# Patient Record
Sex: Female | Born: 1955 | Race: White | Hispanic: No | Marital: Married | Smoking: Never smoker
Health system: Southern US, Community
[De-identification: ages and names within clinical notes are randomized; demographics above are authoritative.]

## PROBLEM LIST (undated history)

## (undated) DIAGNOSIS — H269 Unspecified cataract: Secondary | ICD-10-CM

## (undated) DIAGNOSIS — J302 Other seasonal allergic rhinitis: Secondary | ICD-10-CM

## (undated) DIAGNOSIS — T7840XA Allergy, unspecified, initial encounter: Secondary | ICD-10-CM

## (undated) DIAGNOSIS — S62307A Unspecified fracture of fifth metacarpal bone, left hand, initial encounter for closed fracture: Secondary | ICD-10-CM

## (undated) DIAGNOSIS — M1990 Unspecified osteoarthritis, unspecified site: Secondary | ICD-10-CM

## (undated) DIAGNOSIS — F419 Anxiety disorder, unspecified: Secondary | ICD-10-CM

## (undated) DIAGNOSIS — F32A Depression, unspecified: Secondary | ICD-10-CM

## (undated) DIAGNOSIS — E079 Disorder of thyroid, unspecified: Secondary | ICD-10-CM

## (undated) DIAGNOSIS — E785 Hyperlipidemia, unspecified: Secondary | ICD-10-CM

## (undated) DIAGNOSIS — K5792 Diverticulitis of intestine, part unspecified, without perforation or abscess without bleeding: Secondary | ICD-10-CM

## (undated) DIAGNOSIS — K219 Gastro-esophageal reflux disease without esophagitis: Secondary | ICD-10-CM

## (undated) HISTORY — DX: Anxiety disorder, unspecified: F41.9

## (undated) HISTORY — DX: Gastro-esophageal reflux disease without esophagitis: K21.9

## (undated) HISTORY — DX: Unspecified osteoarthritis, unspecified site: M19.90

## (undated) HISTORY — DX: Other seasonal allergic rhinitis: J30.2

## (undated) HISTORY — DX: Unspecified cataract: H26.9

## (undated) HISTORY — PX: COLONOSCOPY: SHX174

## (undated) HISTORY — PX: APPENDECTOMY: SHX54

## (undated) HISTORY — DX: Diverticulitis of intestine, part unspecified, without perforation or abscess without bleeding: K57.92

## (undated) HISTORY — PX: FRACTURE SURGERY: SHX138

## (undated) HISTORY — PX: TUBAL LIGATION: SHX77

## (undated) HISTORY — DX: Depression, unspecified: F32.A

## (undated) HISTORY — DX: Allergy, unspecified, initial encounter: T78.40XA

## (undated) HISTORY — DX: Hyperlipidemia, unspecified: E78.5

## (undated) HISTORY — DX: Disorder of thyroid, unspecified: E07.9

---

## 1998-11-13 ENCOUNTER — Other Ambulatory Visit: Admission: RE | Admit: 1998-11-13 | Discharge: 1998-11-13 | Payer: Self-pay | Admitting: Gynecology

## 1999-11-17 ENCOUNTER — Other Ambulatory Visit: Admission: RE | Admit: 1999-11-17 | Discharge: 1999-11-17 | Payer: Self-pay | Admitting: Gynecology

## 2001-01-02 ENCOUNTER — Other Ambulatory Visit: Admission: RE | Admit: 2001-01-02 | Discharge: 2001-01-02 | Payer: Self-pay | Admitting: Gynecology

## 2002-01-04 ENCOUNTER — Other Ambulatory Visit: Admission: RE | Admit: 2002-01-04 | Discharge: 2002-01-04 | Payer: Self-pay | Admitting: Gynecology

## 2003-02-12 ENCOUNTER — Other Ambulatory Visit: Admission: RE | Admit: 2003-02-12 | Discharge: 2003-02-12 | Payer: Self-pay | Admitting: Gynecology

## 2004-02-21 ENCOUNTER — Other Ambulatory Visit: Admission: RE | Admit: 2004-02-21 | Discharge: 2004-02-21 | Payer: Self-pay | Admitting: Obstetrics and Gynecology

## 2005-02-16 ENCOUNTER — Ambulatory Visit: Payer: Self-pay | Admitting: Internal Medicine

## 2005-07-30 ENCOUNTER — Other Ambulatory Visit: Admission: RE | Admit: 2005-07-30 | Discharge: 2005-07-30 | Payer: Self-pay | Admitting: Obstetrics and Gynecology

## 2006-04-14 ENCOUNTER — Ambulatory Visit: Payer: Self-pay | Admitting: Internal Medicine

## 2006-08-09 ENCOUNTER — Ambulatory Visit: Payer: Self-pay | Admitting: Internal Medicine

## 2006-10-03 ENCOUNTER — Emergency Department (HOSPITAL_COMMUNITY): Admission: EM | Admit: 2006-10-03 | Discharge: 2006-10-03 | Payer: Self-pay | Admitting: Emergency Medicine

## 2006-10-06 ENCOUNTER — Emergency Department (HOSPITAL_COMMUNITY): Admission: EM | Admit: 2006-10-06 | Discharge: 2006-10-06 | Payer: Self-pay | Admitting: Emergency Medicine

## 2006-10-10 ENCOUNTER — Encounter (HOSPITAL_COMMUNITY): Admission: RE | Admit: 2006-10-10 | Discharge: 2006-12-29 | Payer: Self-pay | Admitting: Emergency Medicine

## 2006-10-13 ENCOUNTER — Ambulatory Visit: Payer: Self-pay | Admitting: Internal Medicine

## 2007-10-16 ENCOUNTER — Telehealth (INDEPENDENT_AMBULATORY_CARE_PROVIDER_SITE_OTHER): Payer: Self-pay | Admitting: *Deleted

## 2007-10-31 DIAGNOSIS — F32A Depression, unspecified: Secondary | ICD-10-CM | POA: Insufficient documentation

## 2007-10-31 DIAGNOSIS — F419 Anxiety disorder, unspecified: Secondary | ICD-10-CM | POA: Insufficient documentation

## 2007-11-02 ENCOUNTER — Ambulatory Visit: Payer: Self-pay | Admitting: Internal Medicine

## 2007-11-02 DIAGNOSIS — K219 Gastro-esophageal reflux disease without esophagitis: Secondary | ICD-10-CM | POA: Insufficient documentation

## 2007-11-02 DIAGNOSIS — M1990 Unspecified osteoarthritis, unspecified site: Secondary | ICD-10-CM | POA: Insufficient documentation

## 2007-11-02 LAB — CONVERTED CEMR LAB
Basophils Absolute: 0 10*3/uL (ref 0.0–0.1)
Basophils Relative: 0.4 % (ref 0.0–1.0)
Free T4: 0.6 ng/dL (ref 0.6–1.6)
Hemoglobin: 11.9 g/dL — ABNORMAL LOW (ref 12.0–15.0)
Lymphocytes Relative: 26.2 % (ref 12.0–46.0)
MCHC: 32.7 g/dL (ref 30.0–36.0)
Monocytes Relative: 7.1 % (ref 3.0–12.0)
Neutro Abs: 3.8 10*3/uL (ref 1.4–7.7)
Neutrophils Relative %: 64.1 % (ref 43.0–77.0)
RBC: 3.98 M/uL (ref 3.87–5.11)

## 2007-11-03 ENCOUNTER — Encounter: Payer: Self-pay | Admitting: Internal Medicine

## 2007-11-07 ENCOUNTER — Encounter (INDEPENDENT_AMBULATORY_CARE_PROVIDER_SITE_OTHER): Payer: Self-pay | Admitting: *Deleted

## 2007-11-09 ENCOUNTER — Encounter: Payer: Self-pay | Admitting: Internal Medicine

## 2008-12-12 ENCOUNTER — Ambulatory Visit: Payer: Self-pay | Admitting: Internal Medicine

## 2008-12-12 DIAGNOSIS — R109 Unspecified abdominal pain: Secondary | ICD-10-CM

## 2008-12-12 LAB — CONVERTED CEMR LAB
Bilirubin Urine: NEGATIVE
Glucose, Urine, Semiquant: NEGATIVE
Ketones, urine, test strip: NEGATIVE
Protein, U semiquant: NEGATIVE
pH: 6.5

## 2008-12-13 LAB — CONVERTED CEMR LAB
Basophils Relative: 0.2 % (ref 0.0–3.0)
Eosinophils Relative: 12.6 % — ABNORMAL HIGH (ref 0.0–5.0)
H Pylori IgG: NEGATIVE
Monocytes Relative: 4.3 % (ref 3.0–12.0)
Neutrophils Relative %: 58.6 % (ref 43.0–77.0)
Platelets: 184 10*3/uL (ref 150.0–400.0)
RBC: 4.04 M/uL (ref 3.87–5.11)
WBC: 7.1 10*3/uL (ref 4.5–10.5)

## 2008-12-18 ENCOUNTER — Encounter (INDEPENDENT_AMBULATORY_CARE_PROVIDER_SITE_OTHER): Payer: Self-pay | Admitting: *Deleted

## 2009-07-16 ENCOUNTER — Ambulatory Visit: Payer: Self-pay | Admitting: Internal Medicine

## 2009-07-16 DIAGNOSIS — M79609 Pain in unspecified limb: Secondary | ICD-10-CM

## 2010-05-06 ENCOUNTER — Ambulatory Visit: Payer: Self-pay | Admitting: Internal Medicine

## 2010-07-28 NOTE — Assessment & Plan Note (Signed)
Summary: ?? bonchitis//lch   Vital Signs:  Patient profile:   55 year old female Weight:      158 pounds Temp:     98.2 degrees F oral Pulse rate:   80 / minute Resp:     15 per minute BP sitting:   124 / 88  (left arm) Cuff size:   large  Vitals Entered By: Shonna Chock CMA (May 06, 2010 10:36 AM) CC: Bronchitis since last Thursday , URI symptoms   CC:  Bronchitis since last Thursday  and URI symptoms.  History of Present Illness:  RTI  Symptoms      This is a 55 year old woman who presents withRTI  symptoms since 11/03.  The patient reports nasal congestion, sore throat( initial symptom) , and dry cough ( she feels " rattling"), but denies purulent nasal discharge and earache.  The patient denies fever, dyspnea, and wheezing.  The patient also reports headache frontally & in temples.  The patient denies the following risk factors for Strep sinusitis: unilateral nasal discharge/ facial pain, tooth pain, and tender adenopathy.  Rx: Mucinex, Zyrtec D  Current Medications (verified): 1)  Paxil 20 Mg  Tabs (Paroxetine Hcl) .Marland Kitchen.. 1 By Mouth Once Daily 2)  Prilosec Otc 20 Mg Tbec (Omeprazole Magnesium) .... Take 1 Tab Once Daily  Allergies: 1)  ! Pcn  Physical Exam  General:  in no acute distress; alert,appropriate and cooperative throughout examination Ears:  External ear exam shows no significant lesions or deformities.  Otoscopic examination reveals clear canals, tympanic membranes are intact bilaterally without bulging, retraction, inflammation or discharge. Hearing is grossly normal bilaterally. Nose:  External nasal examination shows no deformity or inflammation. Nasal mucosa are pink and moist without lesions or exudates. Mouth:  Oral mucosa and oropharynx without lesions or exudates.  Teeth in good repair. Hoarse Lungs:  Normal respiratory effort, chest expands symmetrically. Lungs are clear to auscultation, no crackles or wheezes. Heart:  Normal rate and regular rhythm.  S1 and S2 normal without gallop, murmur, click, rub . Cervical Nodes:  No lymphadenopathy noted Axillary Nodes:  No palpable lymphadenopathy   Impression & Recommendations:  Problem # 1:  BRONCHITIS-ACUTE (ICD-466.0)  The following medications were removed from the medication list:    Clarithromycin 500 Mg Xr24h-tab (Clarithromycin) .Marland Kitchen... 2 once daily with a meal Her updated medication list for this problem includes:    Azithromycin 250 Mg Tabs (Azithromycin) .[facility]... As per pack    Hydromet 5-1.5 Mg/60ml Syrp (Hydrocodone-homatropine) .Mar[facility] Kitchen... 1 tsp every 6 hrs as needed  Complete Medication List: 1)  Paxil 20 Mg Tabs (Paroxetine hcl) .Mar[facility] Kitchen.. 1 by mouth once daily 2)  Prilosec Otc 20 Mg Tbec (Omeprazole magnesium) .... Take 1 tab once daily 3)  Azithromycin 250 Mg Tabs (Azithromycin) .... As per pack 4)  Hydromet 5-1.5 Mg/80ml Syrp (Hydrocodone-homatropine) .Marland Kitchen.. 1 tsp every 6 hrs as needed  Patient Instructions: 1)  Neti pot once daily as needed for head congestion. 2)  Drink as much NON dairy  fluid as you can tolerate for the next few days. Prescriptions: HYDROMET 5-1.5 MG/5ML SYRP (HYDROCODONE-HOMATROPINE) 1 tsp every 6 hrs as needed  #120cc x 0   Entered and Authorized by:   Marga Melnick MD   Signed by:   Marga Melnick MD on 05/06/2010   Method used:   Print then Give to Patient   RxID:   [id number] AZITHROMYCIN 250 MG TABS (AZITHROMYCIN) as per pack  #1 x 0   Entered  and Authorized by:   Marga Melnick MD   Signed by:   Marga Melnick MD on 05/06/2010   Method used:   Print then Give to Patient   RxID:   [id number]    Orders Added: 1)  Est. Patient Level III [30865]

## 2010-07-28 NOTE — Assessment & Plan Note (Signed)
Summary: SINUS PROBLEM   Vital Signs:  Patient profile:   55 year old female Weight:      157 pounds Temp:     98.4 degrees F oral Pulse rate:   86 / minute Resp:     16 per minute BP sitting:   100 / 70  (left arm)  Vitals Entered By: Jeremy Johann CMA (July 16, 2009 1:02 PM) CC: sinus pressure, drainage in back of throat, no cough, URI symptoms Comments REVIEWED MED LIST, PATIENT AGREED DOSE AND INSTRUCTION CORRECT    CC:  sinus pressure, drainage in back of throat, no cough, and URI symptoms.  History of Present Illness:  URI Symptoms      This is a 55 year old woman who presents with URI symptoms since pre 06/21/2009.  The patient reports nasal congestion, but denies  sore throat, productive cough, earache, and sick contacts.  The patient denies fever, R temporal  headache, and severe fatigue. Green PNDrainage. The patient denies itchy watery eyes and muscle aches.  Risk factors for Strep sinusitis include unilateral facial pain and tooth pain.  The patient denies the following risk factors for Strep sinusitis: unilateral nasal discharge, Strep exposure, and tender adenopathy.  Rx: Zyrtec D, Mucinex  Plain & D  Allergies: 1)  ! Pcn  Review of Systems MS:  Pain  R great toe since 03/2009; 2 Christmas pain L medial arch. Tennis shoes help; heels hurt. PMH of plantar fasciitis.  Physical Exam  General:  well-nourished,in no acute distress; alert,appropriate and cooperative throughout examination Ears:  External ear exam shows no significant lesions or deformities.  Otoscopic examination reveals clear canals, tympanic membranes are intact bilaterally without bulging, retraction, inflammation or discharge. Hearing is grossly normal bilaterally. Nose:  External nasal examination shows no deformity or inflammation. Nasal mucosa are pink and moist without lesions or exudates. Mouth:  Oral mucosa and oropharynx without lesions or exudates.  Teeth in good repair. Lungs:  Normal  respiratory effort, chest expands symmetrically. Lungs are clear to auscultation, no crackles or wheezes. Extremities:  Full ROM L great toe  w/o pain. Tender L medial arch with slight soft  tissue  swelling ; no temp or skin color change Cervical Nodes:  No lymphadenopathy noted Axillary Nodes:  No palpable lymphadenopathy   Impression & Recommendations:  Problem # 1:  SINUSITIS- ACUTE-NOS (ICD-461.9)  Her updated medication list for this problem includes:    Clarithromycin 500 Mg Xr24h-tab (Clarithromycin) .Marland Kitchen... 2 once daily with a meal  Problem # 2:  FOOT PAIN, LEFT (ICD-729.5)  Complete Medication List: 1)  Paxil 20 Mg Tabs (Paroxetine hcl) .Marland Kitchen.. 1 by mouth once daily 2)  Prilosec Otc 20 Mg Tbec (Omeprazole magnesium) .... Take 1 tab once daily 3)  Clarithromycin 500 Mg Xr24h-tab (Clarithromycin) .... 2 once daily with a meal 4)  Celebrex 200 Mg Caps (Celecoxib) .Marland Kitchen.. 1 two times a day as needed for foot pain  Patient Instructions: 1)  Neti pot once daily until sinuses clear.  2)  Drink as much fluid as you can tolerate for the next few days. Epsom Salts soaks in eve. Wear arch supports  Prescriptions: CELEBREX 200 MG CAPS (CELECOXIB) 1 two times a day as needed for foot pain  #12 x 0   Entered and Authorized by:   Marga Melnick MD   Signed by:   Marga Melnick MD on 07/16/2009   Method used:   Samples Given   RxID:   [id number] CLARITHROMYCIN 500 MG  XR24H-TAB (CLARITHROMYCIN) 2 once daily with a meal  #20 x 0   Entered and Authorized by:   Marga Melnick MD   Signed by:   Marga Melnick MD on 07/16/2009   Method used:   Faxed to ...       [facility]       [address]       [place], [place]  [address]       Ph: [phone]       Fax: [phone]   RxID:   [id number]

## 2010-08-20 ENCOUNTER — Encounter: Payer: Self-pay | Admitting: Internal Medicine

## 2010-08-20 ENCOUNTER — Ambulatory Visit (INDEPENDENT_AMBULATORY_CARE_PROVIDER_SITE_OTHER): Payer: Self-pay | Admitting: Internal Medicine

## 2010-08-20 DIAGNOSIS — J018 Other acute sinusitis: Secondary | ICD-10-CM

## 2010-08-20 DIAGNOSIS — J209 Acute bronchitis, unspecified: Secondary | ICD-10-CM

## 2010-08-25 NOTE — Assessment & Plan Note (Signed)
Summary: cough,sore throat/cbs   Vital Signs:  Patient profile:   55 year old female Weight:      158 pounds O2 Sat:      99 % on Room air Temp:     98.1 degrees F oral Pulse rate:   94 / minute Resp:     14 per minute BP sitting:   110 / 70  (left arm) Cuff size:   large  Vitals Entered By: Shonna Chock CMA (August 20, 2010 3:21 PM)  O2 Flow:  Room air CC: Cough, head congestion and SOB off/on x 1 month. Sore throat resolved, URI symptoms   CC:  Cough, head congestion and SOB off/on x 1 month. Sore throat resolved, and URI symptoms.  History of Present Illness:    Onset 1 month ago as head congestion;  symptoms have waxed & waned since . Symptoms exacerbated as cough last week. She now  reports nasal congestion, purulent nasal discharge, and productive cough, but denies earache.  The patient denies fever, dyspnea, and wheezing.  The patient also reports  frontal headache & bilateral facial pain.  The patient denies the following risk factors for Strep sinusitis: tooth pain and tender adenopathy.  Rx: Sinus pills, Zyrtec D, Neti pot  Current Medications (verified): 1)  Paxil 20 Mg  Tabs (Paroxetine Hcl) .Marland Kitchen.. 1 By Mouth Once Daily 2)  Prilosec Otc 20 Mg Tbec (Omeprazole Magnesium) .... Take 1 Tab Once Daily  Allergies: 1)  ! Pcn  Physical Exam  General:  in no acute distress; alert,appropriate and cooperative throughout examination Ears:  External ear exam shows no significant lesions or deformities.  Otoscopic examination reveals clear canals, tympanic membranes are intact bilaterally without bulging, retraction, inflammation or discharge. Hearing is grossly normal bilaterally. Nose:  External nasal examination shows no deformity or inflammation. Nasal mucosa are pink and moist without lesions or exudates. Slight hyponasal speech Mouth:  Oral mucosa and oropharynx without lesions or exudates.  Teeth in good repair. Lungs:  Normal respiratory effort, chest expands  symmetrically. Lungs  minimal rhonchi , no crackles or wheezes. Cervical Nodes:  No lymphadenopathy noted Axillary Nodes:  No palpable lymphadenopathy   Impression & Recommendations:  Problem # 1:  BRONCHITIS-ACUTE (ICD-466.0)  The following medications were removed from the medication list:    Azithromycin 250 Mg Tabs (Azithromycin) .Marland Kitchen... As per pack    Hydromet 5-1.5 Mg/59ml Syrp (Hydrocodone-homatropine) .Marland Kitchen... 1 tsp every 6 hrs as needed Her updated medication list for this problem includes:    Smz-tmp Ds 800-160 Mg Tabs (Sulfamethoxazole-trimethoprim) .Marland Kitchen... 1 two times a day with 8 oz water  Problem # 2:  RHINOSINUSITIS, ACUTE (ICD-461.8)  The following medications were removed from the medication list:    Azithromycin 250 Mg Tabs (Azithromycin) .Marland Kitchen... As per pack    Hydromet 5-1.5 Mg/62ml Syrp (Hydrocodone-homatropine) .Marland Kitchen... 1 tsp every 6 hrs as needed Her updated medication list for this problem includes:    Smz-tmp Ds 800-160 Mg Tabs (Sulfamethoxazole-trimethoprim) .Marland Kitchen... 1 two times a day with 8 oz water    Fluticasone Propionate 50 Mcg/act Susp (Fluticasone propionate) .Marland Kitchen... 1 spray two times a day as needed  Complete Medication List: 1)  Paxil 20 Mg Tabs (Paroxetine hcl) .Marland Kitchen.. 1 by mouth once daily 2)  Prilosec Otc 20 Mg Tbec (Omeprazole magnesium) .... Take 1 tab once daily 3)  Smz-tmp Ds 800-160 Mg Tabs (Sulfamethoxazole-trimethoprim) .Marland Kitchen.. 1 two times a day with 8 oz water 4)  Fluticasone Propionate 50 Mcg/act Susp (Fluticasone propionate) .[facility]land [facility]  1 spray two times a day as needed  Patient Instructions: 1)  Neti pot once daily - two times a day as needed . 2)  Drink as much NON dairy  fluid as you can tolerate for the next few days. Prescriptions: FLUTICASONE PROPIONATE 50 MCG/ACT SUSP (FLUTICASONE PROPIONATE) 1 spray two times a day as needed  #1 x 5   Entered and Authorized by:   Marga Melnick MD   Signed by:   Marga Melnick MD on 08/20/2010   Method used:    Electronically to        [facility] (retail)       [address]       [place], [place]  [address]       Ph: [phone]       Fax: [phone]   RxID:   [id number] SMZ-TMP DS 800-160 MG TABS (SULFAMETHOXAZOLE-TRIMETHOPRIM) 1 two times a day with 8 oz water  #20 x 0   Entered and Authorized by:   Marga Melnick MD   Signed by:   Marga Melnick MD on 08/20/2010   Method used:   Electronically to        [facility] (retail)       [address]       [place], [place]  [address]       Ph: [phone]       Fax: [phone]   RxID:   [id number]    Orders Added: 1)  Est. Patient Level III [88416]

## 2011-02-02 ENCOUNTER — Other Ambulatory Visit: Payer: Self-pay | Admitting: Internal Medicine

## 2011-02-02 NOTE — Telephone Encounter (Signed)
Patient needs to schedule a CPX  

## 2011-05-12 ENCOUNTER — Encounter: Payer: Self-pay | Admitting: Internal Medicine

## 2011-05-13 ENCOUNTER — Ambulatory Visit (INDEPENDENT_AMBULATORY_CARE_PROVIDER_SITE_OTHER): Payer: Self-pay | Admitting: Internal Medicine

## 2011-05-13 ENCOUNTER — Encounter: Payer: Self-pay | Admitting: Internal Medicine

## 2011-05-13 DIAGNOSIS — F329 Major depressive disorder, single episode, unspecified: Secondary | ICD-10-CM

## 2011-05-13 DIAGNOSIS — F411 Generalized anxiety disorder: Secondary | ICD-10-CM

## 2011-05-13 DIAGNOSIS — F32A Depression, unspecified: Secondary | ICD-10-CM

## 2011-05-13 DIAGNOSIS — J069 Acute upper respiratory infection, unspecified: Secondary | ICD-10-CM

## 2011-05-13 MED ORDER — FLUTICASONE PROPIONATE 50 MCG/ACT NA SUSP: 2.0000 | Freq: Every day | NASAL | Status: DC

## 2011-05-13 MED ORDER — PAROXETINE HCL 40 MG PO TABS: 40.0000 mg | ORAL_TABLET | Freq: Every day | ORAL | Status: DC

## 2011-05-13 NOTE — Progress Notes (Signed)
  Subjective:    Patient ID: Kathryn [name], female    DOB: [birth year], 55 y.o.   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 Heat intolerance due to menopause Family history of mental health issues, alcoholism or drug abuse:Mother, M uncles & M aunts  had anxiety/ depression  Disorders. No bipolar disorder She has no suicidal ideation   Respiratory tract infection: Onset/symptoms:11/14 as ST Exposures (illness/environmental/extrinsic):whole family ill Progression of symptoms:stable Treatments/response:none Present symptoms Fever/chills/sweats:none except menopause symptoms Frontal headache:no Facial pain:no Nasal purulence:no Dental pain:no Lymphadenopathy:no Wheezing/shortness of breath:no Cough/sputum/hemoptysis:no Associated extrinsic/allergic symptoms:itchy eyes/ sneezing:no Past medical history: Seasonal allergies; yes/asthma:no Smoking history:never              Review of Systems     Objective:   Physical Exam General appearance is of good health and nourishment; no acute distress or increased work of breathing is present.  No  lymphadenopathy about the head, neck, or axilla noted.   Eyes: No conjunctival inflammation or lid edema is present. .  Ears:  External ear exam shows no significant lesions or deformities.  Otoscopic examination reveals clear canals, tympanic membranes are intact bilaterally without bulging, retraction, inflammation or discharge.  Nose:  External nasal examination shows  no deformity or inflammation. Nasal mucosa are dry without lesions or exudates. No septal dislocation .No obstruction to airflow.   Oral exam: Dental hygiene is good; lips and gums are healthy appearing.There is no oropharyngeal erythema or exudate noted.   Neck:  No deformities, thyromegaly, masses, or tenderness noted.   Supple with full range of motion without pain.   Heart:  Normal rate and regular rhythm. S1 and S2 normal without gallop, murmur, click, rub S4 with slight slurring   Lungs:Chest clear to auscultation; no wheezes, rhonchi,rales ,or rubs present.No increased work of breathing.    Extremities:  No cyanosis, edema, or clubbing  noted    Skin: Warm & dry   Psych:  Cognition and judgment appear intact. Alert, communicative  and cooperative with normal attention span and concentration. No apparent delusions, illusions, hallucinations          Assessment & Plan:  #1 anxiety/depression disorder. The strong family history of such. Depression has been an issue for the last 2 months; This is despite maintenance paroxetine.  #2 upper respirator tract symptoms without criteria for rhinosinusitis.  Plan: See orders and recommendations.

## 2011-05-13 NOTE — Patient Instructions (Signed)
Zicam Melts or Zinc lozenges ; vitamin C 2000 mg daily; & Echinacea for 4-7 days. Report fever, exudate("pus") or progressive pain. Fill the sulfa  prescription only if you have frontal headaches, facial pain, and nasal purulence. Please keep a diary of symptoms and signs. Enter significant symptoms, frequency, severity ( 1-10 scale), possible triggers, and response to medication, exercise or other therapeutic options as discussed.

## 2011-05-14 LAB — TSH: TSH: 3.31 u[IU]/mL (ref 0.35–5.50)

## 2011-05-17 ENCOUNTER — Other Ambulatory Visit: Payer: Self-pay | Admitting: Internal Medicine

## 2011-05-17 MED ORDER — SULFAMETHOXAZOLE-TMP DS 800-160 MG PO TABS: 1.0000 | ORAL_TABLET | Freq: Two times a day (BID) | ORAL | Status: AC

## 2011-05-17 NOTE — Telephone Encounter (Signed)
Rx sent to pharmacy, Pt aware.

## 2011-05-17 NOTE — Telephone Encounter (Signed)
Generic Septra DS one every 12 hours with 8 ounces of water; dispense # 20

## 2011-05-17 NOTE — Telephone Encounter (Signed)
Dr.Hopper please advise 

## 2011-07-11 ENCOUNTER — Ambulatory Visit: Payer: Self-pay

## 2011-07-11 DIAGNOSIS — J019 Acute sinusitis, unspecified: Secondary | ICD-10-CM

## 2011-08-16 ENCOUNTER — Other Ambulatory Visit: Payer: Self-pay | Admitting: Internal Medicine

## 2011-08-19 ENCOUNTER — Ambulatory Visit: Payer: Self-pay

## 2011-08-19 ENCOUNTER — Ambulatory Visit (INDEPENDENT_AMBULATORY_CARE_PROVIDER_SITE_OTHER): Payer: Self-pay | Admitting: Family Medicine

## 2011-08-19 VITALS — BP 120/78 | HR 69 | Temp 98.1°F | Resp 16 | Ht 67.5 in | Wt 157.0 lb

## 2011-08-19 DIAGNOSIS — M79609 Pain in unspecified limb: Secondary | ICD-10-CM

## 2011-08-19 DIAGNOSIS — M25542 Pain in joints of left hand: Secondary | ICD-10-CM

## 2011-08-19 DIAGNOSIS — S62307A Unspecified fracture of fifth metacarpal bone, left hand, initial encounter for closed fracture: Secondary | ICD-10-CM

## 2011-08-19 MED ORDER — ACETAMINOPHEN 325 MG PO TABS: 500.0000 mg | ORAL_TABLET | Freq: Once | ORAL | Status: DC

## 2011-08-19 MED ORDER — HYDROCODONE-ACETAMINOPHEN 5-500 MG PO CAPS: 1.0000 | ORAL_CAPSULE | Freq: Four times a day (QID) | ORAL | Status: AC | PRN

## 2011-08-19 NOTE — Progress Notes (Signed)
  Subjective:    Patient ID: Kathryn [name], female    DOB: June 09, 1956, 56 y.o.   MRN: [id number]  HPI Patient fell over baby gate earlier this morning. Since (L) fifth digit painful deformed appearing.   Review of Systems     Objective:   Physical Exam  Constitutional: She appears well-developed and well-nourished.  HENT:  Head: Normocephalic and atraumatic.  Neck: Neck supple.  Cardiovascular: Normal rate, regular rhythm and normal heart sounds.   Pulmonary/Chest: Effort normal and breath sounds normal.  Musculoskeletal:       Hands: Skin: Skin is warm.     [facility] reading (PRIMARY) by  Dr. Hal Hope Midshaft fracture of the (L) 5th metacarpal.       Assessment & Plan:   1. Pain, joint, hand, left  DG Hand Complete Left  2. Fracture of fifth metacarpal bone of left hand  acetaminophen (TYLENOL) tablet 487.5 mg, Ambulatory referral to Orthopedic Surgery   See medications on after visit summary Ulnar gutter splint

## 2011-08-19 NOTE — Progress Notes (Signed)
Ulnar gutter splint applied left forearm.  Eula Listen, PA-C 08/19/2011 3:52 PM

## 2011-08-31 ENCOUNTER — Encounter (HOSPITAL_BASED_OUTPATIENT_CLINIC_OR_DEPARTMENT_OTHER): Payer: Self-pay | Admitting: *Deleted

## 2011-08-31 NOTE — Progress Notes (Signed)
No labs needed

## 2011-09-02 ENCOUNTER — Other Ambulatory Visit: Payer: Self-pay | Admitting: Orthopedic Surgery

## 2011-09-03 ENCOUNTER — Encounter (HOSPITAL_BASED_OUTPATIENT_CLINIC_OR_DEPARTMENT_OTHER): Payer: Self-pay | Admitting: Orthopedic Surgery

## 2011-09-03 ENCOUNTER — Encounter (HOSPITAL_BASED_OUTPATIENT_CLINIC_OR_DEPARTMENT_OTHER)
Admission: RE | Disposition: A | Discharge status: Home / Self Care | Payer: Self-pay | Source: Ambulatory Visit | Attending: Orthopedic Surgery

## 2011-09-03 ENCOUNTER — Encounter (HOSPITAL_BASED_OUTPATIENT_CLINIC_OR_DEPARTMENT_OTHER): Payer: Self-pay | Admitting: Anesthesiology

## 2011-09-03 ENCOUNTER — Encounter (HOSPITAL_BASED_OUTPATIENT_CLINIC_OR_DEPARTMENT_OTHER): Payer: Self-pay | Admitting: *Deleted

## 2011-09-03 ENCOUNTER — Ambulatory Visit (HOSPITAL_BASED_OUTPATIENT_CLINIC_OR_DEPARTMENT_OTHER): Payer: Self-pay | Admitting: Anesthesiology

## 2011-09-03 ENCOUNTER — Ambulatory Visit (HOSPITAL_BASED_OUTPATIENT_CLINIC_OR_DEPARTMENT_OTHER)
Admission: RE | Admit: 2011-09-03 | Discharge: 2011-09-03 | Disposition: A | Discharge status: Home / Self Care | Payer: Self-pay | Source: Ambulatory Visit | Attending: Orthopedic Surgery | Admitting: Orthopedic Surgery

## 2011-09-03 DIAGNOSIS — M1990 Unspecified osteoarthritis, unspecified site: Secondary | ICD-10-CM | POA: Insufficient documentation

## 2011-09-03 DIAGNOSIS — S62309A Unspecified fracture of unspecified metacarpal bone, initial encounter for closed fracture: Secondary | ICD-10-CM | POA: Insufficient documentation

## 2011-09-03 DIAGNOSIS — K219 Gastro-esophageal reflux disease without esophagitis: Secondary | ICD-10-CM | POA: Insufficient documentation

## 2011-09-03 DIAGNOSIS — X58XXXA Exposure to other specified factors, initial encounter: Secondary | ICD-10-CM | POA: Insufficient documentation

## 2011-09-03 DIAGNOSIS — S62307A Unspecified fracture of fifth metacarpal bone, left hand, initial encounter for closed fracture: Secondary | ICD-10-CM | POA: Diagnosis present

## 2011-09-03 DIAGNOSIS — F411 Generalized anxiety disorder: Secondary | ICD-10-CM | POA: Insufficient documentation

## 2011-09-03 HISTORY — DX: Unspecified fracture of fifth metacarpal bone, left hand, initial encounter for closed fracture: S62.307A

## 2011-09-03 HISTORY — PX: ORIF FINGER FRACTURE: SHX2122

## 2011-09-03 SURGERY — OPEN REDUCTION INTERNAL FIXATION (ORIF) METACARPAL (FINGER) FRACTURE
Anesthesia: General | Site: Finger | Laterality: Left | Wound class: Clean

## 2011-09-03 MED ORDER — ROPIVACAINE HCL 5 MG/ML IJ SOLN
INTRAMUSCULAR | Status: DC | PRN
  Administered 2011-09-03: 28 mL via EPIDURAL

## 2011-09-03 MED ORDER — METOCLOPRAMIDE HCL 5 MG/ML IJ SOLN: 10.0000 mg | Freq: Once | INTRAMUSCULAR | Status: DC | PRN

## 2011-09-03 MED ORDER — LIDOCAINE HCL (CARDIAC) 20 MG/ML IV SOLN
INTRAVENOUS | Status: DC | PRN
  Administered 2011-09-03: 50 mg via INTRAVENOUS

## 2011-09-03 MED ORDER — DROPERIDOL 2.5 MG/ML IJ SOLN
INTRAMUSCULAR | Status: DC | PRN
  Administered 2011-09-03: 0.625 mg via INTRAVENOUS

## 2011-09-03 MED ORDER — OXYCODONE-ACETAMINOPHEN 5-325 MG PO TABS: 1.0000 | ORAL_TABLET | Freq: Four times a day (QID) | ORAL | Status: AC | PRN

## 2011-09-03 MED ORDER — FENTANYL CITRATE 0.05 MG/ML IJ SOLN: 25.0000 ug | INTRAMUSCULAR | Status: DC | PRN

## 2011-09-03 MED ORDER — FENTANYL CITRATE 0.05 MG/ML IJ SOLN
50.0000 ug | INTRAMUSCULAR | Status: DC | PRN
  Administered 2011-09-03: 100 ug via INTRAVENOUS

## 2011-09-03 MED ORDER — PHENYLEPHRINE HCL 10 MG/ML IJ SOLN
INTRAMUSCULAR | Status: DC | PRN
  Administered 2011-09-03 (×2): 40 ug via INTRAVENOUS

## 2011-09-03 MED ORDER — LIDOCAINE HCL 1 % IJ SOLN
INTRAMUSCULAR | Status: DC | PRN
  Administered 2011-09-03: 2 mL via INTRADERMAL

## 2011-09-03 MED ORDER — MIDAZOLAM HCL 2 MG/2ML IJ SOLN
0.5000 mg | INTRAMUSCULAR | Status: DC | PRN
  Administered 2011-09-03: 2 mg via INTRAVENOUS

## 2011-09-03 MED ORDER — PROMETHAZINE HCL 25 MG PO TABS: 25.0000 mg | ORAL_TABLET | Freq: Four times a day (QID) | ORAL | Status: AC | PRN

## 2011-09-03 MED ORDER — PROPOFOL 10 MG/ML IV EMUL
INTRAVENOUS | Status: DC | PRN
  Administered 2011-09-03: 250 mg via INTRAVENOUS

## 2011-09-03 MED ORDER — ONDANSETRON HCL 4 MG/2ML IJ SOLN
INTRAMUSCULAR | Status: DC | PRN
  Administered 2011-09-03: 4 mg via INTRAVENOUS

## 2011-09-03 MED ORDER — DEXAMETHASONE SODIUM PHOSPHATE 4 MG/ML IJ SOLN
INTRAMUSCULAR | Status: DC | PRN
  Administered 2011-09-03: 10 mg via INTRAVENOUS

## 2011-09-03 MED ORDER — CEFAZOLIN SODIUM 1-5 GM-% IV SOLN
1.0000 g | INTRAVENOUS | Status: AC
  Administered 2011-09-03: 1 g via INTRAVENOUS

## 2011-09-03 MED ORDER — MORPHINE SULFATE 2 MG/ML IJ SOLN: 0.0500 mg/kg | INTRAMUSCULAR | Status: DC | PRN

## 2011-09-03 MED ORDER — LACTATED RINGERS IV SOLN
INTRAVENOUS | Status: DC
  Administered 2011-09-03 (×2): via INTRAVENOUS

## 2011-09-03 SURGICAL SUPPLY — 75 items
APL SKNCLS STERI-STRIP NONHPOA (GAUZE/BANDAGES/DRESSINGS)
BANDAGE ELASTIC 3 VELCRO ST LF (GAUZE/BANDAGES/DRESSINGS) ×1 IMPLANT
BANDAGE ELASTIC 4 VELCRO ST LF (GAUZE/BANDAGES/DRESSINGS) IMPLANT
BENZOIN TINCTURE PRP APPL 2/3 (GAUZE/BANDAGES/DRESSINGS) ×1 IMPLANT
BIT DRILL 1.1 MINI (BIT) ×1 IMPLANT
BIT DRILL 1.5 (BIT) ×1
BIT DRILL 1.5MM (BIT) IMPLANT
BLADE MINI RND TIP GREEN BEAV (BLADE) ×1 IMPLANT
BLADE SURG 15 STRL LF DISP TIS (BLADE) ×1 IMPLANT
BLADE SURG 15 STRL SS (BLADE) ×2
BNDG CMPR 9X4 STRL LF SNTH (GAUZE/BANDAGES/DRESSINGS) ×1
BNDG COHESIVE 4X5 TAN STRL (GAUZE/BANDAGES/DRESSINGS) IMPLANT
BNDG ESMARK 4X9 LF (GAUZE/BANDAGES/DRESSINGS) ×2 IMPLANT
CLOTH BEACON ORANGE TIMEOUT ST (SAFETY) ×2 IMPLANT
CORDS BIPOLAR (ELECTRODE) ×2 IMPLANT
COVER TABLE BACK 60X90 (DRAPES) ×2 IMPLANT
CUFF TOURNIQUET SINGLE 18IN (TOURNIQUET CUFF) ×2 IMPLANT
DECANTER SPIKE VIAL GLASS SM (MISCELLANEOUS) IMPLANT
DRAPE EXTREMITY T 121X128X90 (DRAPE) ×2 IMPLANT
DRAPE INCISE IOBAN 66X45 STRL (DRAPES) ×1 IMPLANT
DRAPE OEC MINIVIEW 54X84 (DRAPES) ×2 IMPLANT
DRAPE SURG 17X23 STRL (DRAPES) ×2 IMPLANT
DRAPE U 20/CS (DRAPES) ×2 IMPLANT
DRAPE UTILITY W/TAPE 26X15 (DRAPES) ×1 IMPLANT
DRILL BIT 1.1 MINI (BIT) ×2
DRILL BIT 1.5MM (BIT) ×2
DURAPREP 26ML APPLICATOR (WOUND CARE) ×2 IMPLANT
ELECT REM PT RETURN 9FT ADLT (ELECTROSURGICAL)
ELECTRODE REM PT RTRN 9FT ADLT (ELECTROSURGICAL) ×1 IMPLANT
GAUZE XEROFORM 1X8 LF (GAUZE/BANDAGES/DRESSINGS) ×2 IMPLANT
GLOVE BIO SURGEON STRL SZ 6.5 (GLOVE) ×1 IMPLANT
GLOVE BIO SURGEON STRL SZ8 (GLOVE) ×2 IMPLANT
GLOVE BIOGEL PI IND STRL 7.0 (GLOVE) IMPLANT
GLOVE BIOGEL PI IND STRL 8 (GLOVE) ×2 IMPLANT
GLOVE BIOGEL PI INDICATOR 7.0 (GLOVE) ×1
GLOVE BIOGEL PI INDICATOR 8 (GLOVE) ×2
GLOVE EXAM NITRILE PF MED BLUE (GLOVE) ×2 IMPLANT
GLOVE ORTHO TXT STRL SZ7.5 (GLOVE) ×2 IMPLANT
GOWN BRE IMP PREV XXLGXLNG (GOWN DISPOSABLE) ×1 IMPLANT
GOWN PREVENTION PLUS XLARGE (GOWN DISPOSABLE) ×3 IMPLANT
NDL HYPO 25X1 1.5 SAFETY (NEEDLE) IMPLANT
NEEDLE HYPO 25X1 1.5 SAFETY (NEEDLE) IMPLANT
NS IRRIG 1000ML POUR BTL (IV SOLUTION) ×2 IMPLANT
PACK BASIN DAY SURGERY FS (CUSTOM PROCEDURE TRAY) ×2 IMPLANT
PAD CAST 3X4 CTTN HI CHSV (CAST SUPPLIES) IMPLANT
PAD CAST 4YDX4 CTTN HI CHSV (CAST SUPPLIES) IMPLANT
PADDING CAST ABS 4INX4YD NS (CAST SUPPLIES)
PADDING CAST ABS COTTON 4X4 ST (CAST SUPPLIES) IMPLANT
PADDING CAST COTTON 3X4 STRL (CAST SUPPLIES) ×2
PADDING CAST COTTON 4X4 STRL (CAST SUPPLIES)
PENCIL BUTTON HOLSTER BLD 10FT (ELECTRODE) ×1 IMPLANT
SCREW CORTEX 1.5X10 (Screw) ×1 IMPLANT
SCREW CORTEX 1.5X9 (Screw) ×1 IMPLANT
SLEEVE SCD COMPRESS KNEE MED (MISCELLANEOUS) ×2 IMPLANT
SLING ARM FOAM STRAP LRG (SOFTGOODS) ×1 IMPLANT
SPLINT PLASTER CAST XFAST 3X15 (CAST SUPPLIES) IMPLANT
SPLINT PLASTER CAST XFAST 4X15 (CAST SUPPLIES) ×10 IMPLANT
SPLINT PLASTER XTRA FAST SET 4 (CAST SUPPLIES) ×10
SPLINT PLASTER XTRA FASTSET 3X (CAST SUPPLIES)
SPONGE GAUZE 4X4 12PLY (GAUZE/BANDAGES/DRESSINGS) ×2 IMPLANT
STOCKINETTE 4X48 STRL (DRAPES) ×2 IMPLANT
STRIP CLOSURE SKIN 1/2X4 (GAUZE/BANDAGES/DRESSINGS) IMPLANT
SUCTION FRAZIER TIP 10 FR DISP (SUCTIONS) ×1 IMPLANT
SUT ETHILON 3 0 PS 1 (SUTURE) IMPLANT
SUT ETHILON 4 0 PS 2 18 (SUTURE) ×1 IMPLANT
SUT MNCRL AB 4-0 PS2 18 (SUTURE) IMPLANT
SUT VIC AB 0 CT1 27 (SUTURE)
SUT VIC AB 0 CT1 27XBRD ANBCTR (SUTURE) IMPLANT
SUT VICRYL 3-0 CR8 SH (SUTURE) ×1 IMPLANT
SYR BULB 3OZ (MISCELLANEOUS) ×2 IMPLANT
SYR CONTROL 10ML LL (SYRINGE) IMPLANT
TOWEL OR 17X24 6PK STRL BLUE (TOWEL DISPOSABLE) ×2 IMPLANT
TUBE CONNECTING 20X1/4 (TUBING) ×1 IMPLANT
UNDERPAD 30X30 INCONTINENT (UNDERPADS AND DIAPERS) ×2 IMPLANT
WATER STERILE IRR 1000ML POUR (IV SOLUTION) ×1 IMPLANT

## 2011-09-03 NOTE — Transfer of Care (Signed)
Immediate Anesthesia Transfer of Care Note  Patient: Kathryn [name]  Procedure(s) Performed: Procedure(s) (LRB): OPEN REDUCTION INTERNAL FIXATION (ORIF) METACARPAL (FINGER) FRACTURE (Left)  Patient Location: PACU  Anesthesia Type: General and Regional  Level of Consciousness: awake, alert  and oriented  Airway & Oxygen Therapy: Patient Spontanous Breathing and Patient connected to face mask oxygen  Post-op Assessment: Report given to PACU RN and Post -op Vital signs reviewed and stable  Post vital signs: Reviewed and stable  Complications: No apparent anesthesia complications

## 2011-09-03 NOTE — Anesthesia Procedure Notes (Signed)
Procedure Name: LMA Insertion Date/Time: 09/03/2011 7:34 AM Performed by: Zenia Resides D Pre-anesthesia Checklist: Patient identified, Emergency Drugs available, Suction available, Patient being monitored and Timeout performed Patient Re-evaluated:Patient Re-evaluated prior to inductionOxygen Delivery Method: Circle System Utilized Preoxygenation: Pre-oxygenation with 100% oxygen Intubation Type: IV induction Ventilation: Mask ventilation without difficulty LMA: LMA with gastric port inserted LMA Size: 4.0 Number of attempts: 1 Placement Confirmation: positive ETCO2 and breath sounds checked- equal and bilateral Tube secured with: Tape Dental Injury: Teeth and Oropharynx as per pre-operative assessment

## 2011-09-03 NOTE — Progress Notes (Signed)
Assisted Dr. Frederick with left, ultrasound guided, supraclavicular block. Side rails up, monitors on throughout procedure. See vital signs in flow sheet. Tolerated Procedure well. 

## 2011-09-03 NOTE — H&P (Signed)
  PREOPERATIVE H&P  Chief Complaint: left metacarpal fracture  HPI: Kathryn [name] is a 56 y.o. female who presents for preoperative history and physical with a diagnosis of left metacarpal fracture. Symptoms are rated as moderate to severe, and have been worsening.  This is significantly impairing activities of daily living.  She has elected for surgical management. She has significant displacement with angulation.  Past Medical History  Diagnosis Date  . Anxiety   . GERD (gastroesophageal reflux disease)   . Osteoarthritis    Past Surgical History  Procedure Date  . Tubal ligation   . Appendectomy   . Colonoscopy    History   Social History  . Marital Status: Married    Spouse Name: N/A    Number of Children: N/A  . Years of Education: N/A   Social History Main Topics  . Smoking status: Never Smoker   . Smokeless tobacco: None  . Alcohol Use: No  . Drug Use: No  . Sexually Active:    Other Topics Concern  . None   Social History Narrative  . None   Family History  Problem Relation Age of Onset  . Colitis Mother    Allergies  Allergen Reactions  . Penicillins     ? Rash as child   Prior to Admission medications   Medication Sig Start Date End Date Taking? Authorizing Provider  fluticasone (FLONASE) 50 MCG/ACT nasal spray Place 2 sprays into the nose daily. 05/13/11  Yes Pecola Lawless, MD  HYDROcodone-acetaminophen (VICODIN) 5-500 MG per tablet Take 1 tablet by mouth every 6 (six) hours as needed.   Yes Historical Provider, MD  omeprazole (PRILOSEC) 20 MG capsule Take 20 mg by mouth daily.     Yes Historical Provider, MD  PARoxetine (PAXIL) 20 MG tablet TAKE TWO TABLETS BY MOUTH EVERY DAY 08/16/11  Yes Pecola Lawless, MD     Positive ROS: All other systems have been reviewed and were otherwise negative with the exception of those mentioned in the HPI and as above.  Physical Exam: General: Alert, no acute distress Cardiovascular: No pedal  edema Respiratory: No cyanosis, no use of accessory musculature GI: No organomegaly, abdomen is soft and non-tender Skin: No lesions in the area of chief complaint Neurologic: Sensation intact distally Psychiatric: Patient is competent for consent with normal mood and affect Lymphatic: No axillary or cervical lymphadenopathy  MUSCULOSKELETAL: Left hand has pain to palpation over the fifth metacarpal with some angulated deformity of the small finger.  Assessment: left metacarpal fracture  Plan: Plan for Procedure(s): OPEN REDUCTION INTERNAL FIXATION (ORIF) METACARPAL (FINGER) FRACTURE  The risks benefits and alternatives were discussed with the patient including but not limited to the risks of nonoperative treatment, versus surgical intervention including infection, bleeding, nerve injury, malunion, nonunion, the need for revision surgery, hardware prominence, hardware failure, the need for hardware removal, blood clots, cardiopulmonary complications, morbidity, mortality, among others, and they were willing to proceed.  Predicted outcome is good, although there will be at least a six to nine month expected recovery.   Eulas Post, MD 09/03/2011 7:03 AM

## 2011-09-03 NOTE — Anesthesia Postprocedure Evaluation (Signed)
Anesthesia Post Note  Patient: Kathryn [name]  Procedure(s) Performed: Procedure(s) (LRB): OPEN REDUCTION INTERNAL FIXATION (ORIF) METACARPAL (FINGER) FRACTURE (Left)  Anesthesia type: General  Patient location: PACU  Post pain: Pain level controlled  Post assessment: Patient's Cardiovascular Status Stable  Last Vitals:  Filed Vitals:   09/03/11 0930  BP: 119/66  Pulse: 102  Temp:   Resp: 12    Post vital signs: Reviewed and stable  Level of consciousness: alert  Complications: No apparent anesthesia complications

## 2011-09-03 NOTE — Anesthesia Preprocedure Evaluation (Addendum)
Anesthesia Evaluation  Patient identified by MRN, date of birth, ID band Patient awake    Reviewed: Allergy & Precautions, H&P , NPO status , Patient's Chart, lab work & pertinent test results, reviewed documented beta blocker date and time   Airway Mallampati: II TM Distance: >3 FB Neck ROM: full    Dental   Pulmonary neg pulmonary ROS,          Cardiovascular negative cardio ROS      Neuro/Psych PSYCHIATRIC DISORDERS negative neurological ROS     GI/Hepatic Neg liver ROS, GERD-  Medicated and Controlled,  Endo/Other  negative endocrine ROS  Renal/GU negative Renal ROS  negative genitourinary   Musculoskeletal   Abdominal   Peds  Hematology negative hematology ROS (+)   Anesthesia Other Findings See surgeon's H&P   Reproductive/Obstetrics negative OB ROS                           Anesthesia Physical Anesthesia Plan  ASA: II  Anesthesia Plan: General   Post-op Pain Management:    Induction: Intravenous  Airway Management Planned: Oral ETT  Additional Equipment:   Intra-op Plan:   Post-operative Plan:   Informed Consent: I have reviewed the patients History and Physical, chart, labs and discussed the procedure including the risks, benefits and alternatives for the proposed anesthesia with the patient or authorized representative who has indicated his/her understanding and acceptance.     Plan Discussed with: CRNA and Surgeon  Anesthesia Plan Comments:        Anesthesia Quick Evaluation

## 2011-09-03 NOTE — Op Note (Signed)
09/03/2011  8:31 AM  PATIENT:  Kathryn [name]    PRE-OPERATIVE DIAGNOSIS:  left Fifth metacarpal fracture  POST-OPERATIVE DIAGNOSIS:  Same  PROCEDURE:  OPEN REDUCTION INTERNAL FIXATION (ORIF) METACARPAL (FINGER) FRACTURE  SURGEON:  Eulas Post, MD  PHYSICIAN ASSISTANT: Janace Litten, OPA-C, present and scrubbed throughout the case, critical for completion in a timely fashion, and for retraction, instrumentation, and closure.  ANESTHESIA:   General  PREOPERATIVE INDICATIONS:  [name] is a  56 y.o. female with a diagnosis of left Fifth metacarpal fracture who failed conservative measures and elected for surgical management.  She had substantial shortening as well as angulation, and elected for surgical management.  The risks benefits and alternatives were discussed with the patient preoperatively including but not limited to the risks of infection, bleeding, nerve injury, cardiopulmonary complications, the need for revision surgery, among others, and the patient was willing to proceed.  OPERATIVE IMPLANTS: Synthes 1.5 mm screws x2  OPERATIVE FINDINGS: Shortened fracture with abundant callus formation, fifth metacarpal fracture  OPERATIVE PROCEDURE: The patient was brought to the operating room and placed in the supine position. General anesthesia was administered. IV antibiotics were given. The left upper extremity was prepped and draped in usual sterile fashion. Time out was performed. The arm was elevated and exsanguinated and the tourniquet was inflated. Tourniquet time was approximately 40 minutes with 250 mm of mercury.  Dorsal incision was made over the fracture site, and blunt dissection was carried down taking care to protect the dorsal branches of the ulnar nerve. The extensor tendons were retracted radially, and the fracture site identified. There was a substantial amount of callus. Once I cleaned the fracture site, I reduced the fracture anatomically and held with a  clamp. I then placed a lag screw proximally first, and then a second screw distally. Excellent anatomic reconstruction was achieved. I did use the C-arm to confirm appropriate position. I irrigated the wounds copiously and repaired the skin with nylon followed by an ulnar gutter splint. She was also injected. She tolerated the procedure well no complications.

## 2011-09-07 ENCOUNTER — Encounter (HOSPITAL_BASED_OUTPATIENT_CLINIC_OR_DEPARTMENT_OTHER): Payer: Self-pay | Admitting: Orthopedic Surgery

## 2011-10-11 ENCOUNTER — Ambulatory Visit: Payer: Self-pay

## 2011-10-12 ENCOUNTER — Encounter: Payer: Self-pay | Admitting: Internal Medicine

## 2011-10-12 ENCOUNTER — Ambulatory Visit (INDEPENDENT_AMBULATORY_CARE_PROVIDER_SITE_OTHER): Payer: Self-pay | Admitting: Internal Medicine

## 2011-10-12 VITALS — BP 118/76 | HR 92 | Temp 98.5°F | Wt 158.0 lb

## 2011-10-12 DIAGNOSIS — J069 Acute upper respiratory infection, unspecified: Secondary | ICD-10-CM

## 2011-10-12 DIAGNOSIS — J040 Acute laryngitis: Secondary | ICD-10-CM

## 2011-10-12 MED ORDER — DOXYCYCLINE HYCLATE 100 MG PO TABS: 100.0000 mg | ORAL_TABLET | Freq: Two times a day (BID) | ORAL | Status: AC

## 2011-10-12 MED ORDER — HYDROCODONE-HOMATROPINE 5-1.5 MG/5ML PO SYRP: 5.0000 mL | ORAL_SOLUTION | Freq: Four times a day (QID) | ORAL | Status: AC | PRN

## 2011-10-12 NOTE — Patient Instructions (Signed)
Plain Mucinex for thick secretions ;force NON dairy fluids . Use a Neti pot daily as needed for sinus congestion. Nasal cleansing in the shower as discussed. Make sure that all residual soap is removed to prevent irritation.  Unfortunately laryngitis is best treated with voice rest. Stay hydrated drinking to thirst, up to 40 ounces of non dairy fluids per day.

## 2011-10-12 NOTE — Progress Notes (Signed)
  Subjective:    Patient ID: Kathryn [name], female    DOB: May 10, 1956, 56 y.o.   MRN: [id number]  HPI Respiratory tract symptoms Onset/symptoms:2 & 1/2 weeks ago as chest tightness & dry cough Exposures (illness/environmental/extrinsic):grand children ill Progression of symptoms:to  ST & laryngitis  4/11 Treatments/response:saline gargles w/o help Present symptoms: Fever/chills/sweats:sweats Frontal headache:yes  Facial pain:yes Nasal purulence:no Sore throat:not now Dental pain:no Lymphadenopathy:no Wheezing/shortness of breath:? Some wheezing Cough/sputum/hemoptysis:no Associated extrinsic/allergic symptoms:itchy eyes/ sneezing:no Past medical history: Seasonal allergies : no/asthma:no but RAD with bronchitis          Review of Systems     Objective:   Physical Exam General appearance:thin but in good health ;well nourished; no acute distress or increased work of breathing is present.  No  lymphadenopathy about the head, neck, or axilla noted.   Eyes: No conjunctival inflammation or lid edema is present. There is no scleral icterus.  Ears:  External ear exam shows no significant lesions or deformities.  Otoscopic examination reveals clear canals, tympanic membranes are intact bilaterally without bulging, retraction, inflammation or discharge.  Nose:  External nasal examination shows no deformity or inflammation. Nasal mucosa are dry w/o lesions or exudates. No septal dislocation or deviation.No obstruction to airflow.   Oral exam: Dental hygiene is good; lips and gums are healthy appearing.There is no oropharyngeal erythema or exudate noted. Very hoarse     Heart:  Normal rate and regular rhythm. S1 and S2 normal without gallop, murmur, click, rub . S 4 Lungs:Chest clear to auscultation; no wheezes, rhonchi,rales ,or rubs present.No increased work of breathing.    Extremities:  No cyanosis, edema, or clubbing  Noted. L hand in soft brace    Skin: Warm & dry            Assessment & Plan:  #1 upper respiratory infection with laryngitis  #2 nonproductive cough  #3 penicillin allergy  Plan: See orders and recommendations

## 2011-10-14 ENCOUNTER — Ambulatory Visit: Payer: Self-pay | Admitting: Internal Medicine

## 2012-03-07 ENCOUNTER — Other Ambulatory Visit: Payer: Self-pay | Admitting: Internal Medicine

## 2012-06-14 ENCOUNTER — Other Ambulatory Visit: Payer: Self-pay | Admitting: Internal Medicine

## 2012-10-02 ENCOUNTER — Other Ambulatory Visit: Payer: Self-pay | Admitting: Internal Medicine

## 2012-10-03 NOTE — Telephone Encounter (Signed)
Schedule CPX 

## 2012-11-07 ENCOUNTER — Encounter: Payer: Self-pay | Admitting: Internal Medicine

## 2012-12-10 ENCOUNTER — Other Ambulatory Visit: Payer: Self-pay | Admitting: Internal Medicine

## 2012-12-12 NOTE — Telephone Encounter (Signed)
Schedule CPX 

## 2013-01-04 ENCOUNTER — Encounter: Payer: Self-pay | Admitting: Internal Medicine

## 2013-01-04 ENCOUNTER — Ambulatory Visit (INDEPENDENT_AMBULATORY_CARE_PROVIDER_SITE_OTHER): Payer: No Typology Code available for payment source | Admitting: Internal Medicine

## 2013-01-04 VITALS — BP 122/78 | HR 98 | Temp 98.0°F | Resp 12 | Ht 68.0 in | Wt 161.0 lb

## 2013-01-04 DIAGNOSIS — N959 Unspecified menopausal and perimenopausal disorder: Secondary | ICD-10-CM

## 2013-01-04 DIAGNOSIS — F32A Depression, unspecified: Secondary | ICD-10-CM

## 2013-01-04 DIAGNOSIS — F329 Major depressive disorder, single episode, unspecified: Secondary | ICD-10-CM

## 2013-01-04 DIAGNOSIS — Z0000 Encounter for general adult medical examination without abnormal findings: Secondary | ICD-10-CM

## 2013-01-04 DIAGNOSIS — S62307S Unspecified fracture of fifth metacarpal bone, left hand, sequela: Secondary | ICD-10-CM

## 2013-01-04 DIAGNOSIS — S42309S Unspecified fracture of shaft of humerus, unspecified arm, sequela: Secondary | ICD-10-CM

## 2013-01-04 MED ORDER — DULOXETINE HCL 30 MG PO CPEP: ORAL_CAPSULE | ORAL | Status: DC

## 2013-01-04 NOTE — Patient Instructions (Addendum)
Cymbalta is  should be titrated from 30 mg to 60 mg. After 5 - 10 days at the lower dose it can be increased to 60 mg daily.  If you activate the  My Chart system; lab & Xray results will be released directly  to you as soon as I review & address these through the computer. If you choose not to sign up for My Chart within 36 hours of labs being drawn; results will be reviewed & interpretation added before being copied & mailed, causing a delay in getting the results to you.If you do not receive that report within 7-10 days ,please call. Additionally you can use this system to gain direct  access to your records  if  out of town or @ an office of a  physician who is not in  the My Chart network.  This improves continuity of care & places you in control of your medical record.

## 2013-01-04 NOTE — Progress Notes (Signed)
  Subjective:    Patient ID: Kathryn [name], female    DOB: 1956-05-04, 57 y.o.   MRN: [id number]  HPI Keimya is here for a physical;acute issues include situational depression.     Review of Systems   She has been on peroxide team for anxiety with good response. 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 She has spells of significant depression without associated suicidal ideation.     Objective:   Physical Exam Gen.: Healthy and well-nourished in appearance. Alert, appropriate and cooperative throughout exam.Appears younger than stated age  Head: Normocephalic without obvious abnormalities  Eyes: No corneal or conjunctival inflammation noted. Pupils equal round reactive to light and accommodation. Extraocular motion intact. Vision grossly normal with lenses Ears: External  ear exam reveals no significant lesions or deformities. Canals clear .TMs normal. Hearing is grossly normal bilaterally. Nose: External nasal exam reveals no deformity or inflammation. Nasal mucosa are pink and moist. No lesions or exudates noted.   Mouth: Oral mucosa and oropharynx reveal no lesions or exudates. Teeth in good repair. Neck: No deformities, masses, or tenderness noted. Range of motion decreased. Thyroid normal. Lungs: Normal respiratory effort; chest expands symmetrically. Lungs are clear to auscultation without rales, wheezes, or increased work of breathing. Heart: Normal rate and rhythm. Normal S1 and S2. No gallop, click, or rub. S4 w/o murmur. Abdomen: Bowel sounds normal; abdomen soft and nontender. No masses, organomegaly or hernias noted. Genitalia:As per Gyn                                  Musculoskeletal/extremities: There is some asymmetry of the posterior thoracic musculature suggesting occult scoliosis. No clubbing, cyanosis, edema, or significant extremity  deformity noted. Range of motion normal .Tone & strength   Normal. Joints  reveal mild  PIP changes. Nail health good. Small palmar ganglia Able to lie down & sit up w/o help. Negative SLR bilaterally Vascular: Carotid, radial artery, dorsalis pedis and  posterior tibial pulses are full and equal. No bruits present. Neurologic: Alert and oriented x3. Deep tendon reflexes symmetrical and normal.        Skin: Intact without suspicious lesions or rashes. Lymph: No cervical, axillary lymphadenopathy present. Psych: Mood and affect are normal. Normally interactive                                                                                        Assessment & Plan:  #1 comprehensive physical exam; no acute findings #2 mixed anxiety/depression  Plan: see Orders  & Recommendations

## 2013-01-07 ENCOUNTER — Other Ambulatory Visit: Payer: Self-pay | Admitting: Internal Medicine

## 2013-01-08 ENCOUNTER — Other Ambulatory Visit (INDEPENDENT_AMBULATORY_CARE_PROVIDER_SITE_OTHER): Payer: No Typology Code available for payment source

## 2013-01-08 DIAGNOSIS — Z0000 Encounter for general adult medical examination without abnormal findings: Secondary | ICD-10-CM

## 2013-01-08 LAB — BASIC METABOLIC PANEL
CO2: 29 mEq/L (ref 19–32)
Chloride: 102 mEq/L (ref 96–112)
Creatinine, Ser: 0.9 mg/dL (ref 0.4–1.2)
Potassium: 4.1 mEq/L (ref 3.5–5.1)
Sodium: 137 mEq/L (ref 135–145)

## 2013-01-08 LAB — HEPATIC FUNCTION PANEL
AST: 24 U/L (ref 0–37)
Alkaline Phosphatase: 62 U/L (ref 39–117)
Total Bilirubin: 0.5 mg/dL (ref 0.3–1.2)

## 2013-01-08 LAB — CBC WITH DIFFERENTIAL/PLATELET
Basophils Absolute: 0 10*3/uL (ref 0.0–0.1)
HCT: 36.4 % (ref 36.0–46.0)
Hemoglobin: 12.3 g/dL (ref 12.0–15.0)
MCHC: 33.7 g/dL (ref 30.0–36.0)
MCV: 91.8 fl (ref 78.0–100.0)
Monocytes Relative: 5.4 % (ref 3.0–12.0)
Neutrophils Relative %: 81.2 % — ABNORMAL HIGH (ref 43.0–77.0)
RBC: 3.97 Mil/uL (ref 3.87–5.11)
WBC: 9.2 10*3/uL (ref 4.5–10.5)

## 2013-01-08 LAB — LIPID PANEL
Cholesterol: 208 mg/dL — ABNORMAL HIGH (ref 0–200)
Total CHOL/HDL Ratio: 5
VLDL: 41.2 mg/dL — ABNORMAL HIGH (ref 0.0–40.0)

## 2013-01-08 NOTE — Telephone Encounter (Signed)
Spoke with patient, patient states she is unable to take Cymbalta due to price 126 dollars for 1 month supply. Patient would like to know if Dr.Hopper has any other recommendations

## 2013-01-08 NOTE — Telephone Encounter (Signed)
  Cheaper therapeutically equivalent generic Cymbalta  [facility] [phone] (toll-free). Other option is venlafexine extended release 75 mg qd # 30; please checkon cost of these 2 options

## 2013-01-09 ENCOUNTER — Telehealth: Payer: Self-pay

## 2013-01-09 MED ORDER — VENLAFAXINE HCL ER 75 MG PO CP24: 75.0000 mg | ORAL_CAPSULE | Freq: Every day | ORAL | Status: DC

## 2013-01-09 NOTE — Telephone Encounter (Signed)
Tried to call Pt line busy will try again later. 

## 2013-01-09 NOTE — Telephone Encounter (Signed)
See other Rx encounter Duplicate message.

## 2013-01-09 NOTE — Telephone Encounter (Signed)
Generic OK; other option is Rx through  [facility] [phone] (toll-free).

## 2013-01-09 NOTE — Telephone Encounter (Signed)
Ms. Kathryn [name] came in for labs yesterday and left a message that the Cymbalta was $125.00 and she would like to know if there was something. Can she use the generic or does she need name brand only? Please advise. GF/RN

## 2013-01-09 NOTE — Telephone Encounter (Signed)
LMOVM for patient to call back about Cymbalta. Need to give Dr. Frederik Pear recommendation. GF/RN

## 2013-01-09 NOTE — Telephone Encounter (Signed)
Left message to call office

## 2013-01-09 NOTE — Telephone Encounter (Signed)
Discuss with patient, Rx sent. 

## 2013-01-17 ENCOUNTER — Ambulatory Visit (INDEPENDENT_AMBULATORY_CARE_PROVIDER_SITE_OTHER)
Admission: RE | Admit: 2013-01-17 | Discharge: 2013-01-17 | Disposition: A | Discharge status: Home / Self Care | Payer: No Typology Code available for payment source | Source: Ambulatory Visit | Attending: Internal Medicine | Admitting: Internal Medicine

## 2013-01-17 DIAGNOSIS — S42309S Unspecified fracture of shaft of humerus, unspecified arm, sequela: Secondary | ICD-10-CM

## 2013-01-17 DIAGNOSIS — S62307S Unspecified fracture of fifth metacarpal bone, left hand, sequela: Secondary | ICD-10-CM

## 2013-01-17 DIAGNOSIS — N959 Unspecified menopausal and perimenopausal disorder: Secondary | ICD-10-CM

## 2013-01-18 ENCOUNTER — Telehealth: Payer: Self-pay

## 2013-01-18 MED ORDER — PAROXETINE HCL 20 MG PO TABS: 20.0000 mg | ORAL_TABLET | ORAL | Status: DC

## 2013-01-18 NOTE — Telephone Encounter (Signed)
Call from patient who advises that Effexor is too expensive and she would like to go back on the Paxil 20 mg.She was last seen in the office on 01/04/13 for her CPE. Please advise     KP

## 2013-01-18 NOTE — Telephone Encounter (Signed)
OK X 3 mos with 1 refill 

## 2013-01-18 NOTE — Telephone Encounter (Signed)
Hopp please advise  

## 2013-01-18 NOTE — Telephone Encounter (Signed)
Patient aware Rx sent       KP 

## 2013-04-09 ENCOUNTER — Telehealth: Payer: Self-pay | Admitting: *Deleted

## 2013-04-09 NOTE — Telephone Encounter (Signed)
Patient called and stated that she would like to stop Paxil. She would like to know if something else could be suggested or if she needed to come in for an office visit to discuss options. Please advise.

## 2013-04-09 NOTE — Telephone Encounter (Signed)
To best assess benefit of Paxil; I  recommend decreasing it to one half pill daily for 2 weeks and then discontinuing it. See me if you wish to change med based on clinical response or restart Paxil

## 2013-04-10 NOTE — Telephone Encounter (Signed)
Called and spoke with patient concerning her Paxil. She wanted to switch to Prozac, but patient was advised by Dr. Alwyn Ren that it is in the same class as Paxil. Dr. Alwyn Ren recommended an appt with a drug formulary sheet to discuss options. Patient made an appt for next Thursday.

## 2013-04-19 ENCOUNTER — Other Ambulatory Visit: Payer: Self-pay | Admitting: General Practice

## 2013-04-19 ENCOUNTER — Encounter: Payer: Self-pay | Admitting: Internal Medicine

## 2013-04-19 ENCOUNTER — Ambulatory Visit (INDEPENDENT_AMBULATORY_CARE_PROVIDER_SITE_OTHER): Payer: No Typology Code available for payment source | Admitting: Internal Medicine

## 2013-04-19 VITALS — BP 118/68 | HR 98 | Temp 98.4°F | Wt 160.0 lb

## 2013-04-19 DIAGNOSIS — F4323 Adjustment disorder with mixed anxiety and depressed mood: Secondary | ICD-10-CM

## 2013-04-19 MED ORDER — VENLAFAXINE HCL 75 MG PO TABS: 75.0000 mg | ORAL_TABLET | Freq: Two times a day (BID) | ORAL | Status: DC

## 2013-04-19 NOTE — Progress Notes (Signed)
  Subjective:    Patient ID: Kathryn [name], female    DOB: 25-Apr-1956, 57 y.o.   MRN: [id number] HPI  had Several weeks ago she describes feeling "hopeless" despite taking 20 mg of Paxil. She been on this medication since mid November 29, 1996 mainly for anxiety  Based on a phone call , it was recommended she wean and discontinue the Paxil which she has done as of 10/22.  At this time she describes feeling "unsettled, and not peaceful "; but she is not feeling that hopelessness.  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  Additionally she's a caregiver for 2 grandchildren.  Thyroid function test was therapeutic in July.    Review of Systems   She states her daughter has commented in on her mood instability with intermittent crying spells as well as increased irritability.  She denies suicidal ideation. She states that she "simply wants to be happy again".  She also denies panic attacks.    Objective:   Physical Exam Appears healthy and well-nourished & in no acute distress  Extraocular motion is intact. She has no lid lag or proptosis  No carotid bruits are present.No neck pain distention present at 10 - 15 degrees. Thyroid normal to palpation  Heart rhythm and rate are normal with no significant murmurs or gallops.  Chest is clear with no increased work of breathing   No clubbing, cyanosis or edema present.   Pedal pulses are intact   No ischemic skin changes are present . Nails healthy without irregularities.   Alert and oriented. Mood and affect are normal. She is appropriate, open, and communicative.  Strength, tone, DTRs reflexes normal. No tremor present          Assessment & Plan:  See Current Assessment & Plan in Problem List under specific Diagnosis

## 2013-04-19 NOTE — Patient Instructions (Signed)
Your next office appointment will be determined based upon your response to medication change. Please report any significant change in your symptoms.  Begin the venlafaxine 75 mg once daily for one week and then increase to twice a day.

## 2013-04-19 NOTE — Assessment & Plan Note (Addendum)
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 As Cymbalta is not an option; generic Effexor 75 mg twice a day will be initiated. It will be started one pill daily for a week and then increased to one twice a day

## 2013-06-19 ENCOUNTER — Other Ambulatory Visit: Payer: Self-pay | Admitting: *Deleted

## 2013-06-19 DIAGNOSIS — F4323 Adjustment disorder with mixed anxiety and depressed mood: Secondary | ICD-10-CM

## 2013-06-19 MED ORDER — VENLAFAXINE HCL 75 MG PO TABS: 75.0000 mg | ORAL_TABLET | Freq: Two times a day (BID) | ORAL | Status: DC

## 2013-06-22 ENCOUNTER — Ambulatory Visit: Payer: No Typology Code available for payment source | Admitting: Internal Medicine

## 2013-08-14 ENCOUNTER — Ambulatory Visit (INDEPENDENT_AMBULATORY_CARE_PROVIDER_SITE_OTHER): Payer: No Typology Code available for payment source | Admitting: Internal Medicine

## 2013-08-14 VITALS — BP 125/78 | HR 107 | Temp 99.6°F | Resp 18 | Wt 159.0 lb

## 2013-08-14 DIAGNOSIS — R509 Fever, unspecified: Secondary | ICD-10-CM

## 2013-08-14 DIAGNOSIS — R05 Cough: Secondary | ICD-10-CM

## 2013-08-14 DIAGNOSIS — R059 Cough, unspecified: Secondary | ICD-10-CM

## 2013-08-14 DIAGNOSIS — J09X2 Influenza due to identified novel influenza A virus with other respiratory manifestations: Secondary | ICD-10-CM

## 2013-08-14 LAB — POC INFLUENZA A&B (BINAX/QUICKVUE)
INFLUENZA B, POC: NEGATIVE
Influenza A, POC: POSITIVE

## 2013-08-14 MED ORDER — HYDROCODONE-ACETAMINOPHEN 7.5-325 MG/15ML PO SOLN: 10.0000 mL | Freq: Four times a day (QID) | ORAL | Status: DC | PRN

## 2013-08-14 MED ORDER — OSELTAMIVIR PHOSPHATE 75 MG PO CAPS: 75.0000 mg | ORAL_CAPSULE | Freq: Two times a day (BID) | ORAL | Status: DC

## 2013-08-14 NOTE — Progress Notes (Signed)
   Subjective:    Patient ID: Kathryn [name], female    DOB: 05/20/1956, 58 y.o.   MRN: [id number]  HPI Pt presents today with cough, fever, headache, and body aches that started yesterday. Temp this morning was 101.6. She took Advil and Sudafed.  Didn't help relieve symptoms. "My chest hurts".   Denies nausea or vomiting.    Review of Systems     Objective:   Physical Exam   Results for orders placed in visit on 08/14/13  POC INFLUENZA A&B (BINAX)      Result Value Ref Range   Influenza A, POC Positive     Influenza B, POC Negative          Assessment & Plan:  Influenza A Tamiflu/Lortab elixir

## 2013-08-14 NOTE — Progress Notes (Signed)
   Subjective:    Patient ID: Kathryn [name], female    DOB: 11/06/1955, 58 y.o.   MRN: [id number]  HPI    Review of Systems     Objective:   Physical Exam        Assessment & Plan:

## 2013-08-14 NOTE — Patient Instructions (Signed)
Cough, Adult  A cough is a reflex that helps clear your throat and airways. It can help heal the body or may be a reaction to an irritated airway. A cough may only last 2 or 3 weeks (acute) or may last more than 8 weeks (chronic).  CAUSES Acute cough:  Viral or bacterial infections. Chronic cough:  Infections.  Allergies.  Asthma.  Post-nasal drip.  Smoking.  Heartburn or acid reflux.  Some medicines.  Chronic lung problems (COPD).  Cancer. SYMPTOMS   Cough.  Fever.  Chest pain.  Increased breathing rate.  High-pitched whistling sound when breathing (wheezing).  Colored mucus that you cough up (sputum). TREATMENT   A bacterial cough may be treated with antibiotic medicine.  A viral cough must run its course and will not respond to antibiotics.  Your caregiver may recommend other treatments if you have a chronic cough. HOME CARE INSTRUCTIONS   Only take over-the-counter or prescription medicines for pain, discomfort, or fever as directed by your caregiver. Use cough suppressants only as directed by your caregiver.  Use a cold steam vaporizer or humidifier in your bedroom or home to help loosen secretions.  Sleep in a semi-upright position if your cough is worse at night.  Rest as needed.  Stop smoking if you smoke. SEEK IMMEDIATE MEDICAL CARE IF:   You have pus in your sputum.  Your cough starts to worsen.  You cannot control your cough with suppressants and are losing sleep.  You begin coughing up blood.  You have difficulty breathing.  You develop pain which is getting worse or is uncontrolled with medicine.  You have a fever. MAKE SURE YOU:   Understand these instructions.  Will watch your condition.  Will get help right away if you are not doing well or get worse. Document Released: 12/11/2010 Document Revised: 09/06/2011 Document Reviewed: 12/11/2010 ExitCare Patient Information 2014 [employer]. Influenza, Adult Influenza ("the  flu") is a viral infection of the respiratory tract. It occurs more often in winter months because people spend more time in close contact with one another. Influenza can make you feel very sick. Influenza easily spreads from person to person (contagious). CAUSES  Influenza is caused by a virus that infects the respiratory tract. You can catch the virus by breathing in droplets from an infected person's cough or sneeze. You can also catch the virus by touching something that was recently contaminated with the virus and then touching your mouth, nose, or eyes. SYMPTOMS  Symptoms typically last 4 to 10 days and may include:  Fever.  Chills.  Headache, body aches, and muscle aches.  Sore throat.  Chest discomfort and cough.  Poor appetite.  Weakness or feeling tired.  Dizziness.  Nausea or vomiting. DIAGNOSIS  Diagnosis of influenza is often made based on your history and a physical exam. A nose or throat swab test can be done to confirm the diagnosis. RISKS AND COMPLICATIONS You may be at risk for a more severe case of influenza if you smoke cigarettes, have diabetes, have chronic heart disease (such as heart failure) or lung disease (such as asthma), or if you have a weakened immune system. Elderly people and pregnant women are also at risk for more serious infections. The most common complication of influenza is a lung infection (pneumonia). Sometimes, this complication can require emergency medical care and may be life-threatening. PREVENTION  An annual influenza vaccination (flu shot) is the best way to avoid getting influenza. An annual flu shot is now   routinely recommended for all adults in the [place] TREATMENT  In mild cases, influenza goes away on its own. Treatment is directed at relieving symptoms. For more severe cases, your caregiver may prescribe antiviral medicines to shorten the sickness. Antibiotic medicines are not effective, because the infection is caused by a virus, not  by bacteria. HOME CARE INSTRUCTIONS  Only take over-the-counter or prescription medicines for pain, discomfort, or fever as directed by your caregiver.  Use a cool mist humidifier to make breathing easier.  Get plenty of rest until your temperature returns to normal. This usually takes 3 to 4 days.  Drink enough fluids to keep your urine clear or pale yellow.  Cover your mouth and nose when coughing or sneezing, and wash your hands well to avoid spreading the virus.  Stay home from work or school until your fever has been gone for at least 1 full day. SEEK MEDICAL CARE IF:   You have chest pain or a deep cough that worsens or produces more mucus.  You have nausea, vomiting, or diarrhea. SEEK IMMEDIATE MEDICAL CARE IF:   You have difficulty breathing, shortness of breath, or your skin or nails turn bluish.  You have severe neck pain or stiffness.  You have a severe headache, facial pain, or earache.  You have a worsening or recurring fever.  You have nausea or vomiting that cannot be controlled. MAKE SURE YOU:  Understand these instructions.  Will watch your condition.  Will get help right away if you are not doing well or get worse. Document Released: 06/11/2000 Document Revised: 12/14/2011 Document Reviewed: 09/13/2011 ExitCare Patient Information 2014 [employer].  

## 2013-08-19 ENCOUNTER — Encounter: Payer: Self-pay | Admitting: Internal Medicine

## 2013-08-19 DIAGNOSIS — I872 Venous insufficiency (chronic) (peripheral): Secondary | ICD-10-CM | POA: Insufficient documentation

## 2013-08-20 ENCOUNTER — Other Ambulatory Visit: Payer: Self-pay | Admitting: *Deleted

## 2013-08-20 DIAGNOSIS — I8310 Varicose veins of unspecified lower extremity with inflammation: Secondary | ICD-10-CM

## 2013-10-19 ENCOUNTER — Telehealth: Payer: Self-pay | Admitting: Internal Medicine

## 2013-10-19 ENCOUNTER — Other Ambulatory Visit: Payer: Self-pay | Admitting: Internal Medicine

## 2013-10-19 DIAGNOSIS — M7540 Impingement syndrome of unspecified shoulder: Secondary | ICD-10-CM

## 2013-10-19 MED ORDER — TRAMADOL HCL 50 MG PO TABS: 50.0000 mg | ORAL_TABLET | Freq: Four times a day (QID) | ORAL | Status: DC | PRN

## 2013-10-19 NOTE — Telephone Encounter (Signed)
Patient has been advised. Tramadol has been called to [facility] on [address]

## 2013-10-19 NOTE — Telephone Encounter (Signed)
Patient Information:  Caller Name: Patsy LagerYolanda  Phone: [phone]  Patient: Kathryn [name], Kathryn [name]  Gender: Female  DOB: 02/27/1956  Age: 58 Years  PCP: Marga MelnickHopper, William  Office Follow Up:  Does the office need to follow up with this patient?: Yes  Instructions For The Office: No appt available. [facility]. Home care advice and call back parameters reviewed. Advised ER/UCC.  Patient would like to be seen by office.  [facility] clinic hours provided.  RN Note:  No appt available. [facility]. Home care advice and call back parameters reviewed. Advised ER/UCC.  Patient would like to be seen by office.  Symptoms  Reason For Call & Symptoms: Complains of right shoulder pain. Onset two weeks ago after playing and throwing a ball.  Location -from right shoulder down through arm pit and inside the right arm. Described as constant ache throbbing. + ROM. No numbness or tingling.   Denies CP or shortness of breath  Reviewed Health History In EMR: Yes  Reviewed Medications In EMR: Yes  Reviewed Allergies In EMR: Yes  Reviewed Surgeries / Procedures: Yes  Date of Onset of Symptoms: 10/05/2013  Treatments Tried: Advil, muscle relaxer. muscle cream, heating pad.  Treatments Tried Worked: No  Guideline(s) Used:  Shoulder Injury  Disposition Per Guideline:   See Today in Office  Reason For Disposition Reached:   Patient wants to be seen  Advice Given:  Apply Heat to the Area:  Beginning 48 hours after an injury, apply a warm washcloth or heating pad for 10 minutes three times a day.  This will help increase blood flow and improve healing.  Call Back If:  Pain becomes severe  You become worse.  Pain Medicines:  For pain relief, you can take either acetaminophen, ibuprofen, or naproxen.  They are over-the-counter (OTC) pain drugs. You can buy them at the drugstore.  RN Overrode Recommendation:  Make Appointment  No appt available. [facility]. Home care advice and call back parameters reviewed. Advised ER/UCC.  Patient would like to be seen by office.

## 2013-10-19 NOTE — Telephone Encounter (Signed)
Message copied by Noreene LarssonANDREWS, Jasiel Belisle R on Fri Oct 19, 2013  1:56 PM ------      Message from: Pecola LawlessHOPPER, WILLIAM F      Created: Fri Oct 19, 2013  1:38 PM       Use an anti-inflammatory cream such as Aspercreme or Zostrix cream twice a day to the affected area as needed. In lieu of this warm moist compresses or  hot water bottle can be used. Do not apply ice .      Tramadol 50 mg q 6 hrs prn #30      Referral to Dr Terrilee FilesZach Smith , Sports Specialist ------

## 2013-11-02 ENCOUNTER — Encounter: Payer: Self-pay | Admitting: Family Medicine

## 2013-11-02 ENCOUNTER — Ambulatory Visit (INDEPENDENT_AMBULATORY_CARE_PROVIDER_SITE_OTHER): Payer: No Typology Code available for payment source | Admitting: Family Medicine

## 2013-11-02 ENCOUNTER — Other Ambulatory Visit (INDEPENDENT_AMBULATORY_CARE_PROVIDER_SITE_OTHER): Payer: No Typology Code available for payment source

## 2013-11-02 VITALS — BP 142/90 | HR 94 | Wt 161.0 lb

## 2013-11-02 DIAGNOSIS — M25511 Pain in right shoulder: Secondary | ICD-10-CM

## 2013-11-02 DIAGNOSIS — M7550 Bursitis of unspecified shoulder: Secondary | ICD-10-CM | POA: Insufficient documentation

## 2013-11-02 DIAGNOSIS — M25519 Pain in unspecified shoulder: Secondary | ICD-10-CM

## 2013-11-02 DIAGNOSIS — IMO0002 Reserved for concepts with insufficient information to code with codable children: Secondary | ICD-10-CM

## 2013-11-02 DIAGNOSIS — M75100 Unspecified rotator cuff tear or rupture of unspecified shoulder, not specified as traumatic: Secondary | ICD-10-CM

## 2013-11-02 MED ORDER — MELOXICAM 15 MG PO TABS: 15.0000 mg | ORAL_TABLET | Freq: Every day | ORAL | Status: DC

## 2013-11-02 NOTE — Assessment & Plan Note (Signed)
Patient does have what appears to be a subacromial bursitis. Patient also has some calcific change of the subscapularis tendon that could be an old rotator cuff tear or healing rotator cuff tear. Patient given home exercise program, icing protocol meloxicam for 10 days. Patient will try these interventions and come back and see me again in 3 weeks. If she continues to have pain at that time I like to try an intra-articular injection as well as potentially formal physical therapy.

## 2013-11-02 NOTE — Progress Notes (Signed)
Kathryn [name] D.O. [facility] [address] [place], [place] [address] Phone: [phone] Subjective:    I'm seeing this patient by the request  of:  Marga MelnickWilliam Hopper, MD   CC: Shoulder pain  BJY:[id number]:Subjective Evalyn CascoYolanda A Loney Heringetty is a 58 y.o. female coming in with complaint of shoulder pain. Patient states that this is right-sided. Patient states that this pain started about 4 weeks ago. Patient made his appointment 2 weeks ago. Patient states the pain was unfortunately severely tender in was 10 out of 10 pain initially. Patient was waking up at night and did have some mild discomfort on the lateral aspect of the arm. Patient states over the course of time with her decreasing the amount activity she's been doing as well as taking Flexeril and tramadol pain has gotten better it is now 4/10 in severity. Patient denies any radiation of the arm any numbness or weakness. Patient has been sleeping more comfortably as well. Patient still states that she reaches across her body she does have some mild discomfort     Past medical history, social, surgical and family history all reviewed in electronic medical record.   Review of Systems: No headache, visual changes, nausea, vomiting, diarrhea, constipation, dizziness, abdominal pain, skin rash, fevers, chills, night sweats, weight loss, swollen lymph nodes, body aches, joint swelling, muscle aches, chest pain, shortness of breath, mood changes.   Objective Blood pressure 142/90, pulse 94, weight 161 lb (73.029 kg), SpO2 92.00%.  General: No apparent distress alert and oriented x3 mood and affect normal, dressed appropriately.  HEENT: Pupils equal, extraocular movements intact  Respiratory: Patient's speak in full sentences and does not appear short of breath  Cardiovascular: No lower extremity edema, non tender, no erythema  Skin: Warm dry intact with no signs of infection or rash on extremities or on axial skeleton.  Abdomen: Soft nontender    Neuro: Cranial nerves II through XII are intact, neurovascularly intact in all extremities with 2+ DTRs and 2+ pulses.  Lymph: No lymphadenopathy of posterior or anterior cervical chain or axillae bilaterally.  Gait normal with good balance and coordination.  MSK:  Non tender with full range of motion and good stability and symmetric strength and tone of  elbows, wrist, hip, knee and ankles bilaterally.  Shoulder: Right Inspection reveals no abnormalities, atrophy or asymmetry. Palpation is normal with no tenderness over AC joint or bicipital groove. ROM is full in all planes passively. Rotator cuff strength normal throughout. signs of impingement with positive Neer and Hawkin's tests, but negative empty can sign. Speeds and Yergason's tests normal. No labral pathology noted with negative Obrien's, negative clunk and good stability. Normal scapular function observed. No painful arc and no drop arm sign. No apprehension sign Contralateral shoulder unremarkable  MSK US performed of: Right This study was ordered, performed, and interpreted by Terrilee FilesZach Zachrey Deutscher D.O.  Shoulder:   Supraspinatus:  Appears normal on long and transverse views, Bursal bulge seen with shoulder abduction on impingement view. Infraspinatus:  Appears normal on long and transverse views.  Subscapularis:  Patient has a calcific changes at the insertion of the subscapularis this likely healing rotator cuff tear.  Teres Minor:  Appears normal on long and transverse views. AC joint:  Capsule undistended, no geyser sign. Glenohumeral Joint:  Appears normal without effusion. Glenoid Labrum:  Intact without visualized tears. Biceps Tendon:  Appears normal on long and transverse views, no fraying of tendon, tendon located in intertubercular groove, no subluxation with shoulder internal or  external rotation.  Impression: Subacromial bursitis with healing RTC tear     Impression and Recommendations:     This case required  medical decision making of moderate complexity.

## 2013-11-02 NOTE — Patient Instructions (Signed)
Very nice to meet you Ice 20 minutes 2 times a day meloxicam daily for 10 days Exercises 3 times a week starting in 2 days Come back again in 3 weeks.

## 2013-11-23 ENCOUNTER — Ambulatory Visit: Payer: No Typology Code available for payment source | Admitting: Family Medicine

## 2013-12-02 ENCOUNTER — Other Ambulatory Visit: Payer: Self-pay | Admitting: Internal Medicine

## 2013-12-12 ENCOUNTER — Ambulatory Visit: Payer: No Typology Code available for payment source | Admitting: Family Medicine

## 2014-05-31 ENCOUNTER — Other Ambulatory Visit: Payer: Self-pay | Admitting: Internal Medicine

## 2014-06-28 DIAGNOSIS — K5792 Diverticulitis of intestine, part unspecified, without perforation or abscess without bleeding: Secondary | ICD-10-CM

## 2014-06-28 HISTORY — DX: Diverticulitis of intestine, part unspecified, without perforation or abscess without bleeding: K57.92

## 2014-07-05 ENCOUNTER — Telehealth: Payer: Self-pay | Admitting: Internal Medicine

## 2014-07-05 NOTE — Telephone Encounter (Signed)
Pt called in said that she has switched ins compy.  Her new one does not cover her venlafaxine (EFFEXOR) 75 MG tablet [[id number]][[id number]]   She wants to know if there is something that she can be switched to that her ins will cover?

## 2014-07-05 NOTE — Telephone Encounter (Signed)
Needs OV with copy of 2016 coverage list  last seen 10/14

## 2014-07-05 NOTE — Telephone Encounter (Signed)
Please schedule patient for an office visit per Dr Alwyn RenHopper

## 2014-07-11 NOTE — Telephone Encounter (Signed)
Called pt to make appt.  She doesn't have her new ins card as of yet and doesn't know her new policy info.  She is going to try to get a copy of card and call about the card. Once she gets info is going to call and make appt

## 2014-07-12 ENCOUNTER — Encounter: Payer: Self-pay | Admitting: Internal Medicine

## 2014-07-12 ENCOUNTER — Ambulatory Visit (INDEPENDENT_AMBULATORY_CARE_PROVIDER_SITE_OTHER): Payer: 59 | Admitting: Internal Medicine

## 2014-07-12 VITALS — BP 160/88 | HR 87 | Temp 98.3°F | Ht 68.0 in | Wt 158.5 lb

## 2014-07-12 DIAGNOSIS — F32A Depression, unspecified: Secondary | ICD-10-CM

## 2014-07-12 DIAGNOSIS — J069 Acute upper respiratory infection, unspecified: Secondary | ICD-10-CM

## 2014-07-12 DIAGNOSIS — R030 Elevated blood-pressure reading, without diagnosis of hypertension: Secondary | ICD-10-CM

## 2014-07-12 DIAGNOSIS — F329 Major depressive disorder, single episode, unspecified: Secondary | ICD-10-CM

## 2014-07-12 MED ORDER — SULFAMETHOXAZOLE-TRIMETHOPRIM 800-160 MG PO TABS: 1.0000 | ORAL_TABLET | Freq: Two times a day (BID) | ORAL | Status: DC

## 2014-07-12 MED ORDER — VENLAFAXINE HCL 75 MG PO TABS: 75.0000 mg | ORAL_TABLET | Freq: Two times a day (BID) | ORAL | Status: DC

## 2014-07-12 NOTE — Patient Instructions (Signed)
Minimal Blood Pressure Goal= AVERAGE < 140/90;  Ideal is an AVERAGE < 135/85.   Cardiovascular exercise, this can be as simple a program as walking, is recommended 30-45 minutes 3-4 times per week. If you're not exercising you should take 6-8 weeks to build up to this level.   Plain Mucinex (NOT D) for thick secretions ;force NON dairy fluids .   Nasal cleansing in the shower as discussed with lather of mild shampoo.After 10 seconds wash off lather while  exhaling through nostrils. Make sure that all residual soap is removed to prevent irritation.  Flonase OR Nasacort AQ 1 spray in each nostril twice a day as needed. Use the "crossover" technique into opposite nostril spraying toward opposite ear @ 45 degree angle, not straight up into nostril.  Plain Allegra (NOT D )  160 daily , Loratidine 10 mg , OR Zyrtec 10 mg @ bedtime  as needed for itchy eyes & sneezing. Zicam Melts or Zinc lozenges as per package label for sore throat . Complementary options include  vitamin C 2000 mg daily; & Echinacea for 4-7 days.    Fill the  prescription for antibiotic if fever; discolored nasal or chest secretions; or frontal headache or facial pain  present

## 2014-07-12 NOTE — Progress Notes (Signed)
   Subjective:    Patient ID: Kathryn [name], female    DOB: 02/04/1956, 59 y.o.   MRN: [id number]  HPI  She is here for refill of her venlafaxine 75 mg twice a day. This is been very effective. When she does not take it she gets depressed. She has no symptoms if she maintains compliance with its administration. Specifically she denies anxiety, panic attacks, anorexia, excessively fatigue, or insomnia on this medication.  Her blood pressure is elevated today; she has no history of hypertension.She has taken some OTC decongestants (see below).  She is on no specific dietary program. She does not exercise but is active.  She has had upper respiratory tract infection illness for 2 weeks. She's been using pseudoephedrine.  Last week she had sore throat, frontal and maxilla sinus pain, fever, and sneezing. She also had yellow/green nasal discharge  Using the decongestants , Mucinex and nasal gavage she now only has head and ear congestion mainly on the right. She did have some bloody discharge from the R nare. She has a minor cough which is nonproductive.  Review of Systems   Chest pain, palpitations, tachycardia, exertional dyspnea, paroxysmal nocturnal dyspnea, claudication or edema are absent.      Objective:   Physical Exam  Pertinent or positive findings include: Repeat blood pressure was 138/88. The nares are markedly dry with inflammatory changes on the right. There is no associated exudate. She has an S4 without murmur or gallops. She has osteoarthritic changes of the right hand but not the left.  Appears healthy and well-nourished & in no acute distress No carotid bruits are present.No neck vein distention present at 10 - 15 degrees. Thyroid normal to palpation Heart rhythm and rate are normal with no gallop or murmur Chest is clear with no increased work of breathing There is no evidence of aortic aneurysm or renal artery bruits Abdomen soft with no organomegaly or masses.  No HJR No clubbing, cyanosis or edema present. Pedal pulses are intact  No ischemic skin changes are present . Fingernails healthy  Alert and oriented. Strength, tone, DTRs reflexes normal        Assessment & Plan:  #1 depression, well controlled with the present medication  #2 elevated blood pressure without diagnosis of hypertension  #3 upper respiratory tract infection  See orders and recommendation. CPX recommended in 6 months

## 2014-07-12 NOTE — Progress Notes (Signed)
Pre visit review using our clinic review tool, if applicable. No additional management support is needed unless otherwise documented below in the visit note. 

## 2014-12-02 ENCOUNTER — Encounter: Payer: Self-pay | Admitting: Internal Medicine

## 2014-12-02 ENCOUNTER — Ambulatory Visit (INDEPENDENT_AMBULATORY_CARE_PROVIDER_SITE_OTHER): Payer: 59 | Admitting: Internal Medicine

## 2014-12-02 VITALS — BP 122/78 | HR 88 | Temp 97.9°F | Resp 18 | Ht 69.0 in | Wt 157.8 lb

## 2014-12-02 DIAGNOSIS — K219 Gastro-esophageal reflux disease without esophagitis: Secondary | ICD-10-CM | POA: Diagnosis not present

## 2014-12-02 DIAGNOSIS — Z0000 Encounter for general adult medical examination without abnormal findings: Secondary | ICD-10-CM

## 2014-12-02 DIAGNOSIS — K5732 Diverticulitis of large intestine without perforation or abscess without bleeding: Secondary | ICD-10-CM

## 2014-12-02 DIAGNOSIS — F329 Major depressive disorder, single episode, unspecified: Secondary | ICD-10-CM

## 2014-12-02 DIAGNOSIS — F32A Depression, unspecified: Secondary | ICD-10-CM

## 2014-12-02 MED ORDER — VENLAFAXINE HCL 75 MG PO TABS: 75.0000 mg | ORAL_TABLET | Freq: Two times a day (BID) | ORAL | Status: DC

## 2014-12-02 NOTE — Progress Notes (Signed)
Subjective:    Patient ID: Kathryn [name], female    DOB: [birth year], 57 y.o.   MRN: [id number]  HPI She is here for a physical;acute issues include resolving acute diverticulitis.  She was seen at the urgent care 11/24/14 and prescribed Cipro and Flagyl. She has 3 pills left of Cipro. She has no active symptoms at this time except for some loose stool. She is unsure when she had her last colonoscopy;it is not on record. It was performed by Dr. Jarold Motto who has retired.  She tried to stop her Prilosec in April but had a flare of reflux symptoms. She's been back on it for a week. She describes burping and dyspepsia.  Also for the last month she's had some fatigue.  The Effexor has been of great benefit in controlling her depression. She has had variable sleep dysfunction. Appetite is good.   She is on a heart healthy diet. She exercises as aerobics once a week and is also very physically active doing activities such as yardwork  Her mother had heart attack at 9. She has had slightly elevated LDLs and slightly low HDLs in the past.    Review of Systems   Chest pain, palpitations, tachycardia, exertional dyspnea, paroxysmal nocturnal dyspnea, claudication or edema are absent. No unexplained weight loss, significant , dysphagia, melena, rectal bleeding, or persistently small caliber stools. Dysuria, pyuria, hematuria, frequency, nocturia or polyuria are denied. Change in hair, skin, nails denied. No bowel changes of constipation or diarrhea. No intolerance to heat or cold.     Objective:   Physical Exam   Pertinent or positive findings include: There is slight decreased range of motion of the cervical spine. She has slight deformities of the PIP and PIP joints. The PIP joints have some fusiform change. She has marked varicosities of the left [name] extremity medially. She also has venous spiders.  Gen.: Adequately nourished in appearance. Alert, appropriate and cooperative throughout  exam.  Appears younger than stated age  Head: Normocephalic without obvious abnormalities  Eyes: No corneal or conjunctival inflammation noted. Pupils equal round reactive to light and accommodation. Extraocular motion intact.  Ears: External  ear exam reveals no significant lesions or deformities. Canals clear .TMs normal. Hearing is grossly normal bilaterally. Nose: External nasal exam reveals no deformity or inflammation. Nasal mucosa are pink and moist. No lesions or exudates noted.   Mouth: Oral mucosa and oropharynx reveal no lesions or exudates. Teeth in good repair. Neck: No deformities, masses, or tenderness noted.  Thyroid normal. Lungs: Normal respiratory effort; chest expands symmetrically. Lungs are clear to auscultation without rales, wheezes, or increased work of breathing. Heart: Normal rate and rhythm. Normal S1 and S2. No gallop, click, or rub.No murmur. Abdomen: Bowel sounds normal; abdomen soft and nontender. No masses, organomegaly or hernias noted. Genitalia: as per Gyn                                  Musculoskeletal/extremities: No deformity or scoliosis noted of  the thoracic or lumbar spine.  No clubbing, cyanosis, edema, or significant extremity  deformity noted.  Range of motion normal . Tone & strength normal. Fingernail  health good. Able to lie down & sit up w/o help.  Negative SLR bilaterally Vascular: Carotid, radial artery, dorsalis pedis and  posterior tibial pulses are full and equal. No bruits present. Neurologic: Alert and oriented x3. Deep tendon reflexes symmetrical and normal.  Gait normal    Skin: Intact without suspicious lesions or rashes. Lymph: No cervical, axillary lymphadenopathy present. Psych: Mood and affect are normal. Normally interactive                                                                                       Assessment & Plan:  #1 comprehensive physical exam; no acute findings #2 resolving diverticulitis #3  GERD  Plan: see Orders  & Recommendations

## 2014-12-02 NOTE — Progress Notes (Signed)
Pre visit review using our clinic review tool, if applicable. No additional management support is needed unless otherwise documented below in the visit note. 

## 2014-12-02 NOTE — Patient Instructions (Signed)
Please take a probiotic , Florastor OR Align, every day if the bowels are loose. This will replace the normal bacteria which  are necessary for formation of normal stool and processing of food. Reflux of gastric acid may be asymptomatic as this may occur mainly during sleep.The triggers for reflux  include stress; the "aspirin family" ; alcohol; peppermint; and caffeine (coffee, tea, cola, and chocolate). The aspirin family would include aspirin and the nonsteroidal agents such as ibuprofen &  Naproxen. Tylenol would not cause reflux. If having symptoms ; food & drink should be avoided for @ least 2 hours before going to bed.

## 2014-12-04 ENCOUNTER — Other Ambulatory Visit: Payer: Self-pay | Admitting: Internal Medicine

## 2014-12-04 ENCOUNTER — Encounter: Payer: Self-pay | Admitting: Gastroenterology

## 2014-12-04 ENCOUNTER — Other Ambulatory Visit (INDEPENDENT_AMBULATORY_CARE_PROVIDER_SITE_OTHER): Payer: 59

## 2014-12-04 DIAGNOSIS — Z0000 Encounter for general adult medical examination without abnormal findings: Secondary | ICD-10-CM

## 2014-12-04 LAB — BASIC METABOLIC PANEL
BUN: 10 mg/dL (ref 6–23)
CO2: 31 meq/L (ref 19–32)
CREATININE: 0.86 mg/dL (ref 0.40–1.20)
Calcium: 9.4 mg/dL (ref 8.4–10.5)
Chloride: 104 mEq/L (ref 96–112)
GFR: 72.24 mL/min (ref >60.00)
GLUCOSE: 94 mg/dL (ref 70–99)
Potassium: 4.7 mEq/L (ref 3.5–5.1)
Sodium: 140 mEq/L (ref 135–145)

## 2014-12-04 LAB — TSH: TSH: 4.6 u[IU]/mL — AB (ref 0.35–4.50)

## 2014-12-04 LAB — CBC WITH DIFFERENTIAL/PLATELET
BASOS PCT: 0.1 % (ref 0.0–3.0)
Basophils Absolute: 0 10*3/uL (ref 0.0–0.1)
EOS ABS: 0 10*3/uL (ref 0.0–0.7)
Eosinophils Relative: 0 % (ref 0.0–5.0)
HCT: 38.3 % (ref 36.0–46.0)
HEMOGLOBIN: 12.8 g/dL (ref 12.0–15.0)
Lymphocytes Relative: 24.7 % (ref 12.0–46.0)
Lymphs Abs: 1.5 10*3/uL (ref 0.7–4.0)
MCHC: 33.5 g/dL (ref 30.0–36.0)
MCV: 89.1 fl (ref 78.0–100.0)
MONO ABS: 0.4 10*3/uL (ref 0.1–1.0)
MONOS PCT: 7.3 % (ref 3.0–12.0)
NEUTROS PCT: 67.9 % (ref 43.0–77.0)
Neutro Abs: 4.1 10*3/uL (ref 1.4–7.7)
Platelets: 283 10*3/uL (ref 150.0–400.0)
RBC: 4.3 Mil/uL (ref 3.87–5.11)
RDW: 13.1 % (ref 11.5–15.5)
WBC: 6.1 10*3/uL (ref 4.0–10.5)

## 2014-12-04 LAB — HEPATIC FUNCTION PANEL
ALT: 10 U/L (ref 0–35)
AST: 18 U/L (ref 0–37)
Albumin: 4.1 g/dL (ref 3.5–5.2)
Alkaline Phosphatase: 71 U/L (ref 39–117)
Bilirubin, Direct: 0 mg/dL (ref 0.0–0.3)
Total Bilirubin: 0.4 mg/dL (ref 0.2–1.2)
Total Protein: 6.9 g/dL (ref 6.0–8.3)

## 2014-12-05 ENCOUNTER — Encounter: Payer: Self-pay | Admitting: Gastroenterology

## 2014-12-05 ENCOUNTER — Telehealth: Payer: Self-pay | Admitting: Gastroenterology

## 2014-12-05 ENCOUNTER — Ambulatory Visit (INDEPENDENT_AMBULATORY_CARE_PROVIDER_SITE_OTHER): Payer: 59 | Admitting: Family

## 2014-12-05 ENCOUNTER — Encounter: Payer: Self-pay | Admitting: Family

## 2014-12-05 VITALS — BP 142/92 | HR 98 | Temp 98.0°F | Resp 18 | Ht 69.0 in | Wt 147.0 lb

## 2014-12-05 DIAGNOSIS — R1032 Left lower quadrant pain: Secondary | ICD-10-CM | POA: Diagnosis not present

## 2014-12-05 MED ORDER — TRAMADOL HCL 50 MG PO TABS: 50.0000 mg | ORAL_TABLET | Freq: Three times a day (TID) | ORAL | Status: DC | PRN

## 2014-12-05 NOTE — Telephone Encounter (Signed)
Spoke with patient and scheduled with Dr. Arlyce Dice on 12/06/14 at 10:45 AM

## 2014-12-05 NOTE — Progress Notes (Signed)
Pre visit review using our clinic review tool, if applicable. No additional management support is needed unless otherwise documented below in the visit note. 

## 2014-12-05 NOTE — Telephone Encounter (Signed)
Error

## 2014-12-05 NOTE — Patient Instructions (Addendum)
Thank you for choosing [facility].  Summary/Instructions:  Please follow a clear liquid diet.  A referral to GI has been placed.  Your prescription(s) have been submitted to your pharmacy or been printed and provided for you. Please take as directed and contact our office if you believe you are having problem(s) with the medication(s) or have any questions.  If your symptoms worsen or fail to improve, please contact our office for further instruction, or in case of emergency go directly to the emergency room at the closest medical facility.   Diverticulitis Diverticulitis is inflammation or infection of small pouches in your colon that form when you have a condition called diverticulosis. The pouches in your colon are called diverticula. Your colon, or large intestine, is where water is absorbed and stool is formed. Complications of diverticulitis can include:  Bleeding.  Severe infection.  Severe pain.  Perforation of your colon.  Obstruction of your colon. CAUSES  Diverticulitis is caused by bacteria. Diverticulitis happens when stool becomes trapped in diverticula. This allows bacteria to grow in the diverticula, which can lead to inflammation and infection. RISK FACTORS People with diverticulosis are at risk for diverticulitis. Eating a diet that does not include enough fiber from fruits and vegetables may make diverticulitis more likely to develop. SYMPTOMS  Symptoms of diverticulitis may include:  Abdominal pain and tenderness. The pain is normally located on the left side of the abdomen, but may occur in other areas.  Fever and chills.  Bloating.  Cramping.  Nausea.  Vomiting.  Constipation.  Diarrhea.  Blood in your stool. DIAGNOSIS  Your health care provider will ask you about your medical history and do a physical exam. You may need to have tests done because many medical conditions can cause the same symptoms as diverticulitis. Tests may  include:  Blood tests.  Urine tests.  Imaging tests of the abdomen, including X-rays and CT scans. When your condition is under control, your health care provider may recommend that you have a colonoscopy. A colonoscopy can show how severe your diverticula are and whether something else is causing your symptoms. TREATMENT  Most cases of diverticulitis are mild and can be treated at home. Treatment may include:  Taking over-the-counter pain medicines.  Following a clear liquid diet.  Taking antibiotic medicines by mouth for 7-10 days. More severe cases may be treated at a hospital. Treatment may include:  Not eating or drinking.  Taking prescription pain medicine.  Receiving antibiotic medicines through an IV tube.  Receiving fluids and nutrition through an IV tube.  Surgery. HOME CARE INSTRUCTIONS   Follow your health care provider's instructions carefully.  Follow a full liquid diet or other diet as directed by your health care provider. After your symptoms improve, your health care provider may tell you to change your diet. He or she may recommend you eat a high-fiber diet. Fruits and vegetables are good sources of fiber. Fiber makes it easier to pass stool.  Take fiber supplements or probiotics as directed by your health care provider.  Only take medicines as directed by your health care provider.  Keep all your follow-up appointments. SEEK MEDICAL CARE IF:   Your pain does not improve.  You have a hard time eating food.  Your bowel movements do not return to normal. SEEK IMMEDIATE MEDICAL CARE IF:   Your pain becomes worse.  Your symptoms do not get better.  Your symptoms suddenly get worse.  You have a fever.  You have repeated  vomiting.  You have bloody or black, tarry stools. MAKE SURE YOU:   Understand these instructions.  Will watch your condition.  Will get help right away if you are not doing well or get worse. Document Released: 03/24/2005  Document Revised: 06/19/2013 Document Reviewed: 05/09/2013 [facility] Patient Information 2015 [place], [place]. This information is not intended to replace advice given to you by your health care provider. Make sure you discuss any questions you have with your health care provider.

## 2014-12-05 NOTE — Assessment & Plan Note (Addendum)
Previously treated for diverticulitis and continues to experience abdominal pain. Cannot rule out continued diverticulitis. Previous blood work normal. Obtain CT scan of abdomen. Start tramadol as needed for pain. Refer to GI for further workup. Start clear liquid diet and progress diet as tolerated. If symptoms worsen instructed to seek further or emergency care.

## 2014-12-05 NOTE — Progress Notes (Signed)
Subjective:    Patient ID: Kathryn [name], female    DOB: [birth year], 59 y.o.   MRN: [id number]  Chief Complaint  Patient presents with  . Follow-up    was up all night hurting in her [name] left side, was told she has diverticulitis    HPI:  Kathryn [name] is a 59 y.o. female with a PMH of GERD, venous insufficiency, and degenerative joint disease who presents today for an acute office visit.   Recently seen at an on 11/24/14 Urgent Care for abdominal pain and diagnosed with divirticulitis. She was treated with Cipro and Metronidazole. She completed the antibiotics and was seen in the office with loose stool and no other active symptoms.  During this visit she was also found to have increased reflux symptoms following attempts to stop her Prilosec.  Continues to experience the associated symptoms of [name] abdominal pain that has been going on for about 2 days. Describes the pain as constant hurting and occasionally sharp with a severity of enough to keep her from sleeping last night. Modifying factors include Gas-X and a heating pad which seemed to help minimally. Currently having slightly formed bowel movements with loose stool yesterday x 3 in the morning and none the rest of the day.   Allergies  Allergen Reactions  . Penicillins     ? Rash as child    Current Outpatient Prescriptions on File Prior to Visit  Medication Sig Dispense Refill  . gabapentin (NEURONTIN) 300 MG capsule Take 300 mg by mouth 3 (three) times daily.   1  . venlafaxine (EFFEXOR) 75 MG tablet Take 1 tablet (75 mg total) by mouth 2 (two) times daily. 180 tablet 3   No current facility-administered medications on file prior to visit.    Past Medical History  Diagnosis Date  . Anxiety   . GERD (gastroesophageal reflux disease)   . Osteoarthritis   . Fracture of fifth metacarpal bone of left hand 09/03/2011    post fall  . Diverticulitis 2016     Review of Systems   Constitutional: Negative for fever and  chills.  Gastrointestinal: Positive for abdominal pain and blood in stool. Negative for nausea, vomiting and abdominal distention.      Objective:    BP 142/92 mmHg  Pulse 98  Temp(Src) 98 F (36.7 C) (Oral)  Resp 18  Ht  (1.753 m)  Wt 147 lb (66.679 kg)  BMI 21.70 kg/m2  SpO2 98% Nursing note and vital signs reviewed.  Physical Exam  Constitutional: She is oriented to person, place, and time. She appears well-developed and well-nourished. No distress.  Cardiovascular: Normal rate, regular rhythm, normal heart sounds and intact distal pulses.   Pulmonary/Chest: Effort normal and breath sounds normal.  Abdominal: Soft. Normal appearance and bowel sounds are normal. There is tenderness in the right [name] quadrant, suprapubic area and left [name] quadrant. There is no rigidity, no rebound, no guarding, no tenderness at McBurney's point and negative Murphy's sign.  Neurological: She is alert and oriented to person, place, and time.  Skin: Skin is warm and dry.  Psychiatric: She has a normal mood and affect. Her behavior is normal. Judgment and thought content normal.       Assessment & Plan:   Problem List Items Addressed This Visit      Other   LLQ abdominal pain - Primary    Previously treated for diverticulitis and continues to experience abdominal pain. Cannot rule out continued diverticulitis. Previous  blood work normal. Obtain CT scan of abdomen. Start tramadol as needed for pain. Refer to GI for further workup. Start clear liquid diet and progress diet as tolerated. If symptoms worsen instructed to seek further or emergency care.       Relevant Medications   traMADol (ULTRAM) 50 MG tablet   Other Relevant Orders   CT Abdomen Pelvis Wo Contrast   Ambulatory referral to Gastroenterology

## 2014-12-06 ENCOUNTER — Ambulatory Visit (INDEPENDENT_AMBULATORY_CARE_PROVIDER_SITE_OTHER): Payer: 59 | Admitting: Gastroenterology

## 2014-12-06 ENCOUNTER — Encounter: Payer: Self-pay | Admitting: Gastroenterology

## 2014-12-06 ENCOUNTER — Other Ambulatory Visit: Payer: Self-pay | Admitting: Internal Medicine

## 2014-12-06 ENCOUNTER — Encounter: Payer: Self-pay | Admitting: Internal Medicine

## 2014-12-06 VITALS — BP 118/62 | HR 88 | Ht 66.75 in | Wt 154.2 lb

## 2014-12-06 DIAGNOSIS — R7989 Other specified abnormal findings of blood chemistry: Secondary | ICD-10-CM

## 2014-12-06 DIAGNOSIS — R1032 Left lower quadrant pain: Secondary | ICD-10-CM

## 2014-12-06 DIAGNOSIS — Z1211 Encounter for screening for malignant neoplasm of colon: Secondary | ICD-10-CM

## 2014-12-06 DIAGNOSIS — E782 Mixed hyperlipidemia: Secondary | ICD-10-CM | POA: Insufficient documentation

## 2014-12-06 DIAGNOSIS — E785 Hyperlipidemia, unspecified: Secondary | ICD-10-CM | POA: Insufficient documentation

## 2014-12-06 DIAGNOSIS — Z1159 Encounter for screening for other viral diseases: Secondary | ICD-10-CM | POA: Insufficient documentation

## 2014-12-06 LAB — NMR LIPOPROFILE WITH LIPIDS
Cholesterol, Total: 181 mg/dL (ref 100–199)
HDL PARTICLE NUMBER: 26.8 umol/L — AB (ref >=30.5)
HDL Size: 9.1 nm — ABNORMAL LOW (ref >=9.2)
HDL-C: 44 mg/dL (ref >39)
LARGE HDL: 6.7 umol/L (ref >=4.8)
LDL (calc): 98 mg/dL (ref 0–99)
LDL Particle Number: 1335 nmol/L — ABNORMAL HIGH (ref <1000)
LDL Size: 20.4 nm (ref >=20.8)
LP-IR Score: 46 — ABNORMAL HIGH (ref <=45)
Large VLDL-P: 3.1 nmol/L — ABNORMAL HIGH (ref <=2.7)
SMALL LDL PARTICLE NUMBER: 639 nmol/L — AB (ref <=527)
TRIGLYCERIDES: 195 mg/dL — AB (ref 0–149)
VLDL SIZE: 46 nm (ref <=46.6)

## 2014-12-06 MED ORDER — HYOSCYAMINE SULFATE ER 0.375 MG PO TBCR: EXTENDED_RELEASE_TABLET | ORAL | Status: DC

## 2014-12-06 NOTE — Progress Notes (Signed)
_                                                                                                                History of Present Illness:  Kathryn [name] is a 59 year old white female referred at the request of Dr. Alwyn Ren for evaluation of abdominal pain.  Approximately 2 weeks ago she developed left sided abdominal pain.  She was placed on Cipro and Flagyl for possible diverticulitis.  Pain slowly subsided.  2 days after stopping antibody was she developed periumbilical pain that radiated to both sides.  It is described as severe, crampy pain.  She is without fever, nausea or vomiting.  He started clear liquids yesterday and noticed that pain has significantly subsided.  She is moving her bowels regularly.  There is no bleeding.   Past Medical History  Diagnosis Date  . Anxiety   . GERD (gastroesophageal reflux disease)   . Osteoarthritis   . Fracture of fifth metacarpal bone of left hand 09/03/2011    post fall  . Diverticulitis 2016   Past Surgical History  Procedure Laterality Date  . Tubal ligation    . Appendectomy    . Colonoscopy      negative; Dr Jarold Motto  . Orif finger fracture  09/03/2011    Procedure: OPEN REDUCTION INTERNAL FIXATION (ORIF) METACARPAL (FINGER) FRACTURE;  Surgeon: Eulas Post, MD;  Location: [facility];  Service: Orthopedics;  Laterality: Left;   family history includes Breast cancer in an other family member; COPD in her mother; Colitis in her mother; Heart attack (age of onset: 43) in her mother; Heart disease in her maternal grandfather. There is no history of Diabetes, Stroke, Colon cancer, Colon polyps, Kidney disease, Gallbladder disease, or Esophageal cancer. Current Outpatient Prescriptions  Medication Sig Dispense Refill  . traMADol (ULTRAM) 50 MG tablet Take 1 tablet (50 mg total) by mouth every 8 (eight) hours as needed. 30 tablet 0  . venlafaxine (EFFEXOR) 75 MG tablet Take 1 tablet (75 mg total) by mouth 2 (two)  times daily. 180 tablet 3   No current facility-administered medications for this visit.   Allergies as of 12/06/2014 - Review Complete 12/06/2014  Allergen Reaction Noted  . Penicillins      reports that she has never smoked. She has never used smokeless tobacco. She reports that she does not drink alcohol or use illicit drugs.   Review of Systems: Pertinent positive and negative review of systems were noted in the above HPI section. All other review of systems were otherwise negative.  Vital signs were reviewed in today's medical record Physical Exam: General: Well developed , well nourished, no acute distress Skin: anicteric Head: Normocephalic and atraumatic Eyes:  sclerae anicteric, EOMI Ears: Normal auditory acuity Mouth: No deformity or lesions Neck: Supple, no masses or thyromegaly Lymph Nodes: no lymphadenopathy Lungs: Clear throughout to auscultation Heart: Regular rate and rhythm; no murmurs, rubs or bruits Gastroinestinal: Soft, non tender and non distended. No masses, hepatosplenomegaly or hernias noted.  Normal Bowel sounds Rectal:deferred Musculoskeletal: Symmetrical with no gross deformities  Skin: No lesions on visible extremities Pulses:  Normal pulses noted Extremities: No clubbing, cyanosis, edema or deformities noted Neurological: Alert oriented x 4, grossly nonfocal Cervical Nodes:  No significant cervical adenopathy Inguinal Nodes: No significant inguinal adenopathy Psychological:  Alert and cooperative. Normal mood and affect  See Assessment and Plan under Problem List

## 2014-12-06 NOTE — Assessment & Plan Note (Addendum)
2 day history of moderately severe crampy abdominal pain.  She is without fever or tenderness.  She was recently treated empirically for diverticulitis.  Residual infection is a possibility.  Doubt  obstruction At this point I will place her on anticholinergics and advance her diet.  Should pain become worse than I will proceed with a CT scan  CC Dr. Alwyn Ren

## 2014-12-06 NOTE — Assessment & Plan Note (Signed)
Prior colonoscopy 2004 was normal.  Plan screening colonoscopy once abdominal pain has entirely subsided.

## 2014-12-06 NOTE — Patient Instructions (Signed)
It has been recommended to you by your physician that you have a(n) COLONOSCOPY completed. Per your request, we did not schedule the procedure(s) today. Please contact our office at [phone] should you decide to have the procedure completed. 

## 2014-12-23 ENCOUNTER — Ambulatory Visit: Payer: 59 | Admitting: Physician Assistant

## 2014-12-26 ENCOUNTER — Telehealth: Payer: Self-pay | Admitting: Family

## 2014-12-26 NOTE — Telephone Encounter (Signed)
Insurance denied pt CT ABD/Pelvis w/o cm

## 2014-12-27 ENCOUNTER — Telehealth: Payer: Self-pay | Admitting: Internal Medicine

## 2014-12-27 NOTE — Telephone Encounter (Signed)
Called patient to see if a recent mammogram has been done left a voicemail. ° °

## 2015-01-01 NOTE — Telephone Encounter (Signed)
Patient seen by GI. They will order additional CT if needed. Cancel current CT.

## 2015-02-07 LAB — HM MAMMOGRAPHY: HM Mammogram: NEGATIVE

## 2015-02-11 ENCOUNTER — Encounter: Payer: Self-pay | Admitting: Internal Medicine

## 2015-09-10 ENCOUNTER — Ambulatory Visit (INDEPENDENT_AMBULATORY_CARE_PROVIDER_SITE_OTHER): Payer: BLUE CROSS/BLUE SHIELD | Admitting: Family

## 2015-09-10 ENCOUNTER — Telehealth: Payer: Self-pay | Admitting: Family

## 2015-09-10 ENCOUNTER — Other Ambulatory Visit (INDEPENDENT_AMBULATORY_CARE_PROVIDER_SITE_OTHER): Payer: BLUE CROSS/BLUE SHIELD

## 2015-09-10 ENCOUNTER — Encounter: Payer: Self-pay | Admitting: Family

## 2015-09-10 VITALS — BP 112/86 | HR 87 | Temp 98.0°F | Resp 16 | Ht 66.5 in | Wt 153.2 lb

## 2015-09-10 DIAGNOSIS — D699 Hemorrhagic condition, unspecified: Secondary | ICD-10-CM

## 2015-09-10 DIAGNOSIS — F4323 Adjustment disorder with mixed anxiety and depressed mood: Secondary | ICD-10-CM | POA: Diagnosis not present

## 2015-09-10 DIAGNOSIS — I872 Venous insufficiency (chronic) (peripheral): Secondary | ICD-10-CM

## 2015-09-10 DIAGNOSIS — I8390 Asymptomatic varicose veins of unspecified lower extremity: Secondary | ICD-10-CM

## 2015-09-10 LAB — IBC PANEL
IRON: 131 ug/dL (ref 42–145)
Saturation Ratios: 33.7 % (ref 20.0–50.0)
TRANSFERRIN: 278 mg/dL (ref 212.0–360.0)

## 2015-09-10 LAB — TSH: TSH: 4.26 u[IU]/mL (ref 0.35–4.50)

## 2015-09-10 LAB — CBC
HEMATOCRIT: 38.2 % (ref 36.0–46.0)
Hemoglobin: 12.8 g/dL (ref 12.0–15.0)
MCHC: 33.4 g/dL (ref 30.0–36.0)
MCV: 89 fl (ref 78.0–100.0)
PLATELETS: 246 10*3/uL (ref 150.0–400.0)
RBC: 4.29 Mil/uL (ref 3.87–5.11)
RDW: 13.7 % (ref 11.5–15.5)
WBC: 5.5 10*3/uL (ref 4.0–10.5)

## 2015-09-10 LAB — FERRITIN: FERRITIN: 16.1 ng/mL (ref 10.0–291.0)

## 2015-09-10 LAB — PROTIME-INR
INR: 1 ratio (ref 0.8–1.0)
Prothrombin Time: 10.9 s (ref 9.6–13.1)

## 2015-09-10 LAB — APTT: APTT: 38.7 s — AB (ref 23.4–32.7)

## 2015-09-10 NOTE — Progress Notes (Signed)
Pre visit review using our clinic review tool, if applicable. No additional management support is needed unless otherwise documented below in the visit note. 

## 2015-09-10 NOTE — Progress Notes (Signed)
Subjective:    Patient ID: Kathryn [name], female    DOB: 04/01/1956, 60 y.o.   MRN: [id number]  Chief Complaint  Patient presents with  . Follow-up    is worried about being on the effexor bc one of the side effects is thinning the blood and she has veins on her legs that are superficial if she hits them with a razor they bleed really bad    HPI:  Kathryn [name] is a 60 y.o. female who  has a past medical history of Anxiety; GERD (gastroesophageal reflux disease); Osteoarthritis; Fracture of fifth metacarpal bone of left hand (09/03/2011); and Diverticulitis (2016). and presents today for a follow up office visit.   Depression/anxiety - Currently prescribed venlafaxine. Reports taking her medications as prescribed. Does note that she does have varicose veins and notes that when she cuts herself she has had some increased bleeding that she is concerned may be related to the Effexor. She also notes that she has had some increased fatigue. No suicidal ideation.    Allergies  Allergen Reactions  . Penicillins     ? Rash as child     Current Outpatient Prescriptions on File Prior to Visit  Medication Sig Dispense Refill  . venlafaxine (EFFEXOR) 75 MG tablet Take 1 tablet (75 mg total) by mouth 2 (two) times daily. 180 tablet 3   No current facility-administered medications on file prior to visit.     Review of Systems  Constitutional: Negative for fever and chills.  Hematological: Bruises/bleeds easily.  Psychiatric/Behavioral: Negative for sleep disturbance and dysphoric mood. The patient is nervous/anxious.       Objective:    BP 112/86 mmHg  Pulse 87  Temp(Src) 98 F (36.7 C) (Oral)  Resp 16  Ht 5' 6.5" (1.689 m)  Wt 153 lb 3.2 oz (69.491 kg)  BMI 24.36 kg/m2  SpO2 93% Nursing note and vital signs reviewed.  Physical Exam  Constitutional: She is oriented to person, place, and time. She appears well-developed and well-nourished. No distress.  Cardiovascular: Normal  rate, regular rhythm, normal heart sounds and intact distal pulses.   Left lower extremity with tortureous and varicosed veins located through her left lower extremity with areas of tenderness. Distal pulses are intact and 1+  Pulmonary/Chest: Effort normal and breath sounds normal.  Neurological: She is alert and oriented to person, place, and time.  Skin: Skin is warm and dry.  Psychiatric: She has a normal mood and affect. Her behavior is normal. Judgment and thought content normal.       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 Bleeding unlikely related to venlafaxine, however cannot be fully ruled out. Bleeding is most likely related to torturous/varicose veins. Refer to vascular surgery for further assessment and evaluation. Follow-up pending blood coagulation results.      Relevant Orders   CBC (Completed)   PTT (Completed)   INR/PT (Completed)     Other   Adjustment disorder with mixed anxiety and depressed mood    Appears stable with current regimen of venlafaxine with concern for side effects of potential increased bleeding. Symptoms are adequately controlled current medication regimen. Continue current dosage of venlafaxine pending blood work results. Obtain TSH, IBC panel, ferritin, and  CBC to rule out metabolic causes of potential increased fatigue although positive related to her depression/anxiety. Follow-up pending blood work.      Relevant Orders   Ferritin (Completed)   IBC panel (Completed)   TSH (Completed)

## 2015-09-10 NOTE — Telephone Encounter (Signed)
Please inform patient that her blood work shows that her iron and white/red blood cells are within the normal ranges. Her clotting times are a little slow, however they are not significantly elevated. While this can be a possible result of increased bleeding I do not think it to be a direct correlation. We can also potentially check some additional blood work if she would like. Therefore, please continue with the vein and vascular surgery referral and we will follow up if the bleeding persists. .Marland Kitchen

## 2015-09-10 NOTE — Assessment & Plan Note (Signed)
Obtain INR and PTT to check coagulation status. Bleeding unlikely related to venlafaxine, however cannot be fully ruled out. Bleeding is most likely related to torturous/varicose veins. Refer to vascular surgery for further assessment and evaluation. Follow-up pending blood coagulation results.

## 2015-09-10 NOTE — Assessment & Plan Note (Signed)
Appears stable with current regimen of venlafaxine with concern for side effects of potential increased bleeding. Symptoms are adequately controlled current medication regimen. Continue current dosage of venlafaxine pending blood work results. Obtain TSH, IBC panel, ferritin, and CBC to rule out metabolic causes of potential increased fatigue although positive related to her depression/anxiety. Follow-up pending blood work.

## 2015-09-10 NOTE — Patient Instructions (Addendum)
Thank you for choosing [facility].  Summary/Instructions:  Please continue to take your medications as prescribed.   Let us know regarding your choice of [facility] and the [facility].  Please stop by the lab on the basement level of the building for your blood work. Your results will be released to MyChart (or called to you) after review, usually within 72 hours after test completion. If any changes need to be made, you will be notified at that same time.  If your symptoms worsen or fail to improve, please contact our office for further instruction, or in case of emergency go directly to the emergency room at the closest medical facility.

## 2015-09-11 ENCOUNTER — Encounter: Payer: Self-pay | Admitting: Family

## 2015-09-12 NOTE — Telephone Encounter (Signed)
Pt is aware of results. 

## 2015-12-31 ENCOUNTER — Other Ambulatory Visit: Payer: Self-pay | Admitting: *Deleted

## 2015-12-31 DIAGNOSIS — F32A Depression, unspecified: Secondary | ICD-10-CM

## 2015-12-31 DIAGNOSIS — F329 Major depressive disorder, single episode, unspecified: Secondary | ICD-10-CM

## 2015-12-31 MED ORDER — VENLAFAXINE HCL 75 MG PO TABS: 75.0000 mg | ORAL_TABLET | Freq: Two times a day (BID) | ORAL | Status: DC

## 2016-07-12 ENCOUNTER — Encounter: Payer: Self-pay | Admitting: Family

## 2016-07-12 ENCOUNTER — Ambulatory Visit (INDEPENDENT_AMBULATORY_CARE_PROVIDER_SITE_OTHER): Payer: BLUE CROSS/BLUE SHIELD | Admitting: Family

## 2016-07-12 VITALS — BP 128/80 | HR 97 | Temp 99.2°F | Resp 16 | Ht 66.5 in | Wt 153.0 lb

## 2016-07-12 DIAGNOSIS — J0100 Acute maxillary sinusitis, unspecified: Secondary | ICD-10-CM | POA: Diagnosis not present

## 2016-07-12 MED ORDER — DOXYCYCLINE HYCLATE 100 MG PO TABS: 100.0000 mg | ORAL_TABLET | Freq: Two times a day (BID) | ORAL | 0 refills | Status: DC

## 2016-07-12 NOTE — Patient Instructions (Addendum)
Thank you for choosing [facility].  SUMMARY AND INSTRUCTIONS:  Medication:  Your prescription(s) have been submitted to your pharmacy or been printed and provided for you. Please take as directed and contact our office if you believe you are having problem(s) with the medication(s) or have any questions.   Follow up:  If your symptoms worsen or fail to improve, please contact our office for further instruction, or in case of emergency go directly to the emergency room at the closest medical facility.    General Recommendations:    Please drink plenty of fluids.  Get plenty of rest   Sleep in humidified air  Use saline nasal sprays  Netti pot   OTC Medications:  Decongestants - helps relieve congestion   Flonase (generic fluticasone) or Nasacort (generic triamcinolone) - please make sure to use the "cross-over" technique at a 45 degree angle towards the opposite eye as opposed to straight up the nasal passageway.   Sudafed (generic pseudoephedrine - Note this is the one that is available behind the pharmacy counter); Products with phenylephrine (-PE) may also be used but is often not as effective as pseudoephedrine.   If you have HIGH BLOOD PRESSURE - Coricidin HBP; AVOID any product that is -D as this contains pseudoephedrine which may increase your blood pressure.  Afrin (oxymetazoline) every 6-8 hours for up to 3 days.   Allergies - helps relieve runny nose, itchy eyes and sneezing   Claritin (generic loratidine), Allegra (fexofenidine), or Zyrtec (generic cyrterizine) for runny nose. These medications should not cause drowsiness.  Note - Benadryl (generic diphenhydramine) may be used however may cause drowsiness  Cough -   Delsym or Robitussin (generic dextromethorphan)  Expectorants - helps loosen mucus to ease removal   Mucinex (generic guaifenesin) as directed on the package.  Headaches / General Aches   Tylenol (generic acetaminophen) - DO  NOT EXCEED 3 grams (3,000 mg) in a 24 hour time period  Advil/Motrin (generic ibuprofen)   Sore Throat -   Salt water gargle   Chloraseptic (generic benzocaine) spray or lozenges / Sucrets (generic dyclonine)    Sinusitis Sinusitis is redness, soreness, and inflammation of the paranasal sinuses. Paranasal sinuses are air pockets within the bones of your face (beneath the eyes, the middle of the forehead, or above the eyes). In healthy paranasal sinuses, mucus is able to drain out, and air is able to circulate through them by way of your nose. However, when your paranasal sinuses are inflamed, mucus and air can become trapped. This can allow bacteria and other germs to grow and cause infection. Sinusitis can develop quickly and last only a short time (acute) or continue over a long period (chronic). Sinusitis that lasts for more than 12 weeks is considered chronic.  CAUSES  Causes of sinusitis include:  Allergies.  Structural abnormalities, such as displacement of the cartilage that separates your nostrils (deviated septum), which can decrease the air flow through your nose and sinuses and affect sinus drainage.  Functional abnormalities, such as when the small hairs (cilia) that line your sinuses and help remove mucus do not work properly or are not present. SIGNS AND SYMPTOMS  Symptoms of acute and chronic sinusitis are the same. The primary symptoms are pain and pressure around the affected sinuses. Other symptoms include:  Upper toothache.  Earache.  Headache.  Bad breath.  Decreased sense of smell and taste.  A cough, which worsens when you are lying flat.  Fatigue.  Fever.  Thick drainage   from your nose, which often is green and may contain pus (purulent).  Swelling and warmth over the affected sinuses. DIAGNOSIS  Your health care provider will perform a physical exam. During the exam, your health care provider may:  Look in your nose for signs of abnormal growths  in your nostrils (nasal polyps).  Tap over the affected sinus to check for signs of infection.  View the inside of your sinuses (endoscopy) using an imaging device that has a light attached (endoscope). If your health care provider suspects that you have chronic sinusitis, one or more of the following tests may be recommended:  Allergy tests.  Nasal culture. A sample of mucus is taken from your nose, sent to a lab, and screened for bacteria.  Nasal cytology. A sample of mucus is taken from your nose and examined by your health care provider to determine if your sinusitis is related to an allergy. TREATMENT  Most cases of acute sinusitis are related to a viral infection and will resolve on their own within 10 days. Sometimes medicines are prescribed to help relieve symptoms (pain medicine, decongestants, nasal steroid sprays, or saline sprays).  However, for sinusitis related to a bacterial infection, your health care provider will prescribe antibiotic medicines. These are medicines that will help kill the bacteria causing the infection.  Rarely, sinusitis is caused by a fungal infection. In theses cases, your health care provider will prescribe antifungal medicine. For some cases of chronic sinusitis, surgery is needed. Generally, these are cases in which sinusitis recurs more than 3 times per year, despite other treatments. HOME CARE INSTRUCTIONS   Drink plenty of water. Water helps thin the mucus so your sinuses can drain more easily.  Use a humidifier.  Inhale steam 3 to 4 times a day (for example, sit in the bathroom with the shower running).  Apply a warm, moist washcloth to your face 3 to 4 times a day, or as directed by your health care provider.  Use saline nasal sprays to help moisten and clean your sinuses.  Take medicines only as directed by your health care provider.  If you were prescribed either an antibiotic or antifungal medicine, finish it all even if you start to feel  better. SEEK IMMEDIATE MEDICAL CARE IF:  You have increasing pain or severe headaches.  You have nausea, vomiting, or drowsiness.  You have swelling around your face.  You have vision problems.  You have a stiff neck.  You have difficulty breathing. MAKE SURE YOU:   Understand these instructions.  Will watch your condition.  Will get help right away if you are not doing well or get worse. Document Released: 06/14/2005 Document Revised: 10/29/2013 Document Reviewed: 06/29/2011 Exit[employer] Patient Information 2015 [employer]. This information is not intended to replace advice given to you by your health care provider. Make sure you discuss any questions you have with your health care provider.   

## 2016-07-12 NOTE — Assessment & Plan Note (Signed)
Symptoms and exam consistent with bacterial maxillary sinusitis. She does have mild enlargement of the right tonsil with no evidence of abscess Start doxycycline. Continue over-the-counter medications as needed for symptom relief and supportive care. Follow-up if symptoms worsen or do not improve.

## 2016-07-12 NOTE — Progress Notes (Signed)
Subjective:    Patient ID: Kathryn [name], female    DOB: [birth year], 61 y.o.   MRN: [id number]  Chief Complaint  Patient presents with  . Sinus pressure    sinus pressure, headache, drainage, and cough states has been going on for a while    HPI:  Kathryn [name] is a 61 y.o. female who  has a past medical history of Anxiety; Diverticulitis (2016); Fracture of fifth metacarpal bone of left hand (09/03/2011); GERD (gastroesophageal reflux disease); and Osteoarthritis. and presents today for an acute office visit.  This is a new problem. Associated symptoms of sinus pressure, headache, drainage and cough have been going on for about a 1 month. Denies fevers. Modifying factors include Allergy medications and Sudafed which have not helped very much. Overall symptoms have worsened since initial symptoms.   Allergies  Allergen Reactions  . Penicillins     ? Rash as child      Outpatient Medications Prior to Visit  Medication Sig Dispense Refill  . venlafaxine (EFFEXOR) 75 MG tablet Take 1 tablet (75 mg total) by mouth 2 (two) times daily. 180 tablet 1   No facility-administered medications prior to visit.      Review of Systems  Constitutional: Negative for chills and fever.  HENT: Positive for congestion and sinus pressure.   Respiratory: Positive for cough. Negative for chest tightness and shortness of breath.   Neurological: Positive for headaches.      Objective:    BP 128/80 (BP Location: Left Arm, Patient Position: Sitting, Cuff Size: Normal)   Pulse 97   Temp 99.2 F (37.3 C) (Oral)   Resp 16   Ht 5' 6.5" (1.689 m)   Wt 153 lb (69.4 kg)   SpO2 97%   BMI 24.32 kg/m  Nursing note and vital signs reviewed.  Physical Exam  Constitutional: She is oriented to person, place, and time. She appears well-developed and well-nourished. No distress.  HENT:  Right Ear: Hearing, tympanic membrane, external ear and ear canal normal.  Left Ear: Hearing, tympanic membrane,  external ear and ear canal normal.  Nose: Right sinus exhibits maxillary sinus tenderness. Left sinus exhibits maxillary sinus tenderness.  Mouth/Throat: Uvula is midline. No tonsillar abscesses.  Right tonsil was 1+ with no exudate.   Cardiovascular: Normal rate, regular rhythm, normal heart sounds and intact distal pulses.   Pulmonary/Chest: Effort normal and breath sounds normal.  Neurological: She is alert and oriented to person, place, and time.  Skin: Skin is warm and dry.  Psychiatric: She has a normal mood and affect. Her behavior is normal. Judgment and thought content normal.       Assessment & Plan:   Problem List Items Addressed This Visit      Respiratory   Acute non-recurrent maxillary sinusitis - Primary    Symptoms and exam consistent with bacterial maxillary sinusitis. She does have mild enlargement of the right tonsil with no evidence of abscess Start doxycycline. Continue over-the-counter medications as needed for symptom relief and supportive care. Follow-up if symptoms worsen or do not improve.      Relevant Medications   doxycycline (VIBRA-TABS) 100 MG tablet       I am having Ms. Ragle start on doxycycline. I am also having her maintain her venlafaxine.   Meds ordered this encounter  Medications  . doxycycline (VIBRA-TABS) 100 MG tablet    Sig: Take 1 tablet (100 mg total) by mouth 2 (two) times daily.    Dispense:  20 tablet    Refill:  0    Order Specific Question:   Supervising Provider    Answer:   Hillard DankerRAWFORD, ELIZABETH A [[id number]]     Follow-up: Return if symptoms worsen or fail to improve.  Jeanine Luzalone, Gregory, FNP

## 2016-07-19 ENCOUNTER — Telehealth: Payer: Self-pay | Admitting: Family

## 2016-07-19 MED ORDER — LEVOFLOXACIN 500 MG PO TABS: 500.0000 mg | ORAL_TABLET | Freq: Every day | ORAL | 0 refills | Status: DC

## 2016-07-19 NOTE — Telephone Encounter (Signed)
Pt aware and will keep us updated if antibiotic does not work.

## 2016-07-19 NOTE — Telephone Encounter (Signed)
Please advise on what the next step would be for patient

## 2016-07-19 NOTE — Telephone Encounter (Signed)
Patient states she seen Kathryn [name] last week.  Patient states she has not gotten any better over the weekend.  States she has continued to run a fever of 99.8.  Is requesting call in regard.

## 2016-07-19 NOTE — Telephone Encounter (Signed)
I will send in a prescription for levofloxacin. She can discontinue the doxycycline.

## 2016-11-10 ENCOUNTER — Encounter: Payer: Self-pay | Admitting: Nurse Practitioner

## 2016-11-10 ENCOUNTER — Telehealth: Payer: Self-pay | Admitting: Nurse Practitioner

## 2016-11-10 ENCOUNTER — Telehealth: Payer: Self-pay | Admitting: Family

## 2016-11-10 ENCOUNTER — Ambulatory Visit (INDEPENDENT_AMBULATORY_CARE_PROVIDER_SITE_OTHER): Payer: BLUE CROSS/BLUE SHIELD | Admitting: Nurse Practitioner

## 2016-11-10 VITALS — BP 122/84 | HR 85 | Temp 98.7°F | Ht 66.5 in | Wt 154.0 lb

## 2016-11-10 DIAGNOSIS — S39012A Strain of muscle, fascia and tendon of lower back, initial encounter: Secondary | ICD-10-CM | POA: Diagnosis not present

## 2016-11-10 DIAGNOSIS — J0141 Acute recurrent pansinusitis: Secondary | ICD-10-CM

## 2016-11-10 MED ORDER — GUAIFENESIN ER 600 MG PO TB12: 600.0000 mg | ORAL_TABLET | Freq: Two times a day (BID) | ORAL | 0 refills | Status: DC | PRN

## 2016-11-10 MED ORDER — CEFDINIR 300 MG PO CAPS: 300.0000 mg | ORAL_CAPSULE | Freq: Two times a day (BID) | ORAL | 0 refills | Status: DC

## 2016-11-10 MED ORDER — SALINE SPRAY 0.65 % NA SOLN: 1.0000 | NASAL | 0 refills | Status: DC | PRN

## 2016-11-10 MED ORDER — MELOXICAM 7.5 MG PO TABS: 7.5000 mg | ORAL_TABLET | Freq: Every day | ORAL | 0 refills | Status: DC | PRN

## 2016-11-10 MED ORDER — CYCLOBENZAPRINE HCL 5 MG PO TABS: 5.0000 mg | ORAL_TABLET | Freq: Every evening | ORAL | 0 refills | Status: DC | PRN

## 2016-11-10 MED ORDER — CETIRIZINE HCL 10 MG PO TABS: 10.0000 mg | ORAL_TABLET | Freq: Every day | ORAL | 0 refills | Status: DC

## 2016-11-10 MED ORDER — METHYLPREDNISOLONE 4 MG PO TBPK: ORAL_TABLET | ORAL | 0 refills | Status: DC

## 2016-11-10 MED ORDER — CEFIXIME 400 MG PO CAPS: 400.0000 mg | ORAL_CAPSULE | Freq: Every day | ORAL | 0 refills | Status: DC

## 2016-11-10 NOTE — Telephone Encounter (Signed)
CHANGE TO MESSAGE: Pt called back and said that she will need the Cefixime (SUPRAX) 400 MG CAPS capsule changed because it is too expensive. The Flexeril is fine but Cefixme will need to be changed. Please advise.

## 2016-11-10 NOTE — Progress Notes (Signed)
Subjective:  Patient ID: Kathryn [name], female    DOB: 12/01/1955  Age: 61 y.o. MRN: [id number]  CC: URI (congestion,coughing going on for 2 mo. ) and Back Pain (1week, strain while working in yard)   Back Pain  This is a new problem. The current episode started in the past 7 days. The problem occurs intermittently. The problem is unchanged. The pain is present in the lumbar spine. The quality of the pain is described as aching and cramping. The pain does not radiate. The symptoms are aggravated by lying down and bending. Pertinent negatives include no abdominal pain, bladder incontinence, bowel incontinence, chest pain, dysuria, fever, headaches, leg pain, numbness, paresis, paresthesias, pelvic pain, perianal numbness, tingling, weakness or weight loss. Risk factors include poor posture. She has tried NSAIDs for the symptoms. The treatment provided mild relief.  Sinusitis  This is a recurrent problem. The current episode started more than 1 month ago. The problem has been waxing and waning since onset. There has been no fever. Associated symptoms include congestion, coughing, diaphoresis, ear pain, sinus pressure and sneezing. Pertinent negatives include no chills, headaches, hoarse voice, neck pain, shortness of breath, sore throat or swollen glands. Past treatments include oral decongestants, antibiotics and spray decongestants. The treatment provided no relief.  treated with levofloxacin and doxycycline 06/2016  Outpatient Medications Prior to Visit  Medication Sig Dispense Refill  . venlafaxine (EFFEXOR) 75 MG tablet Take 1 tablet (75 mg total) by mouth 2 (two) times daily. 180 tablet 1  . levofloxacin (LEVAQUIN) 500 MG tablet Take 1 tablet (500 mg total) by mouth daily. (Patient not taking: Reported on 11/10/2016) 7 tablet 0   No facility-administered medications prior to visit.     ROS See HPI  Objective:  BP 122/84   Pulse 85   Temp 98.7 F (37.1 C)   Ht 5' 6.5" (1.689 m)   Wt  154 lb (69.9 kg)   SpO2 99%   BMI 24.48 kg/m   BP Readings from Last 3 Encounters:  11/10/16 122/84  07/12/16 128/80  09/10/15 112/86    Wt Readings from Last 3 Encounters:  11/10/16 154 lb (69.9 kg)  07/12/16 153 lb (69.4 kg)  09/10/15 153 lb 3.2 oz (69.5 kg)    Physical Exam  Constitutional: She is oriented to person, place, and time.  HENT:  Right Ear: Tympanic membrane, external ear and ear canal normal.  Left Ear: Tympanic membrane, external ear and ear canal normal.  Nose: Mucosal edema and rhinorrhea present. Right sinus exhibits maxillary sinus tenderness. Right sinus exhibits no frontal sinus tenderness. Left sinus exhibits maxillary sinus tenderness. Left sinus exhibits no frontal sinus tenderness.  Mouth/Throat: Uvula is midline. No trismus in the jaw. Posterior oropharyngeal erythema present. No oropharyngeal exudate.  Eyes: No scleral icterus.  Neck: Normal range of motion. Neck supple.  Cardiovascular: Normal rate and normal heart sounds.   Pulmonary/Chest: Effort normal and breath sounds normal.  Abdominal: Soft. She exhibits no distension. There is no tenderness.  Musculoskeletal: She exhibits tenderness. She exhibits no edema.       Lumbar back: She exhibits tenderness. She exhibits normal range of motion, no bony tenderness, no deformity and normal pulse.  Negative straight leg raise. Right paraspinal muscle tenderness. No rash. No erythema  Lymphadenopathy:    She has no cervical adenopathy.  Neurological: She is alert and oriented to person, place, and time.  Vitals reviewed.   Lab Results  Component Value Date   WBC 5.5  09/10/2015   HGB 12.8 09/10/2015   HCT 38.2 09/10/2015   PLT 246.0 09/10/2015   GLUCOSE 94 12/04/2014   CHOL 181 12/04/2014   TRIG 195 (H) 12/04/2014   HDL 44 12/04/2014   LDLDIRECT 130.8 01/08/2013   LDLCALC 98 12/04/2014   ALT 10 12/04/2014   AST 18 12/04/2014   NA 140 12/04/2014   K 4.7 12/04/2014   CL 104 12/04/2014    CREATININE 0.86 12/04/2014   BUN 10 12/04/2014   CO2 31 12/04/2014   TSH 4.26 09/10/2015   INR 1.0 09/10/2015    Dg Bone Density  Result Date: 01/19/2013 Osteopenia with a t score of -1.5 at the right femoral neck   Assessment & Plan:   Chinwe was seen today for uri and back pain.  Diagnoses and all orders for this visit:  Acute recurrent pansinusitis -     methylPREDNISolone (MEDROL DOSEPAK) 4 MG TBPK tablet; Take as directed on package -     Ambulatory referral to ENT -     cetirizine (ZYRTEC) 10 MG tablet; Take 1 tablet (10 mg total) by mouth daily. -     Cefixime (SUPRAX) 400 MG CAPS capsule; Take 1 capsule (400 mg total) by mouth daily. -     sodium chloride (OCEAN) 0.65 % SOLN nasal spray; Place 1 spray into both nostrils as needed for congestion. -     guaiFENesin (MUCINEX) 600 MG 12 hr tablet; Take 1 tablet (600 mg total) by mouth 2 (two) times daily as needed for cough or to loosen phlegm.  Back strain, initial encounter -     cyclobenzaprine (FLEXERIL) 5 MG tablet; Take 1 tablet (5 mg total) by mouth at bedtime as needed for muscle spasms. -     meloxicam (MOBIC) 7.5 MG tablet; Take 1 tablet (7.5 mg total) by mouth daily as needed for pain.   I have discontinued Ms. Gebert's levofloxacin. I am also having her start on methylPREDNISolone, cetirizine, Cefixime, sodium chloride, guaiFENesin, cyclobenzaprine, and meloxicam. Additionally, I am having her maintain her venlafaxine and Omeprazole (PRILOSEC PO).  Meds ordered this encounter  Medications  . Omeprazole (PRILOSEC PO)    Sig: Prilosec  . methylPREDNISolone (MEDROL DOSEPAK) 4 MG TBPK tablet    Sig: Take as directed on package    Dispense:  21 tablet    Refill:  0    Order Specific Question:   Supervising Provider    Answer:   Tresa Garter [[id number]]  . cetirizine (ZYRTEC) 10 MG tablet    Sig: Take 1 tablet (10 mg total) by mouth daily.    Dispense:  30 tablet    Refill:  0    Order Specific Question:    Supervising Provider    Answer:   Tresa Garter [[id number]]  . Cefixime (SUPRAX) 400 MG CAPS capsule    Sig: Take 1 capsule (400 mg total) by mouth daily.    Dispense:  7 capsule    Refill:  0    Order Specific Question:   Supervising Provider    Answer:   Tresa Garter [[id number]]  . sodium chloride (OCEAN) 0.65 % SOLN nasal spray    Sig: Place 1 spray into both nostrils as needed for congestion.    Dispense:  15 mL    Refill:  0    Order Specific Question:   Supervising Provider    Answer:   Tresa Garter [[id number]]  . guaiFENesin (MUCINEX) 600 MG 12 hr tablet  Sig: Take 1 tablet (600 mg total) by mouth 2 (two) times daily as needed for cough or to loosen phlegm.    Dispense:  14 tablet    Refill:  0    Order Specific Question:   Supervising Provider    Answer:   Tresa GarterPLOTNIKOV, ALEKSEI V [[id number]]  . cyclobenzaprine (FLEXERIL) 5 MG tablet    Sig: Take 1 tablet (5 mg total) by mouth at bedtime as needed for muscle spasms.    Dispense:  7 tablet    Refill:  0    Order Specific Question:   Supervising Provider    Answer:   Tresa GarterPLOTNIKOV, ALEKSEI V [[id number]]  . meloxicam (MOBIC) 7.5 MG tablet    Sig: Take 1 tablet (7.5 mg total) by mouth daily as needed for pain.    Dispense:  14 tablet    Refill:  0    Order Specific Question:   Supervising Provider    Answer:   Tresa GarterPLOTNIKOV, ALEKSEI V [[id number]]    Follow-up: Return if symptoms worsen or fail to improve.  Alysia Pennaharlotte Kizzi Overbey, NP

## 2016-11-10 NOTE — Telephone Encounter (Signed)
Pt aware.

## 2016-11-10 NOTE — Patient Instructions (Signed)
You will be contacted with appt to ENT.  Lumbosacral Strain Lumbosacral strain is an injury that causes pain in the lower back (lumbosacral spine). This injury usually occurs from overstretching the muscles or ligaments along your spine. A strain can affect one or more muscles or cord-like tissues that connect bones to other bones (ligaments). What are the causes? This condition may be caused by:  A hard, direct hit (blow) to the back.  Excessive stretching of the lower back muscles. This may result from:  A fall.  Lifting something heavy.  Repetitive movements such as bending or crouching. What increases the risk? The following factors may increase your risk of getting this condition:  Participating in sports or activities that involve:  A sudden twist of the back.  Pushing or pulling motions.  Being overweight or obese.  Having poor strength and flexibility, especially tight hamstrings or weak muscles in the back or abdomen.  Having too much of a curve in the lower back.  Having a pelvis that is tilted forward. What are the signs or symptoms? The main symptom of this condition is pain in the lower back, at the site of the strain. Pain may extend (radiate) down one or both legs. How is this diagnosed? This condition is diagnosed based on:  Your symptoms.  Your medical history.  A physical exam.  Your health care provider may push on certain areas of your back to determine the source of your pain.  You may be asked to bend forward, backward, and side to side to assess the severity of your pain and your range of motion.  Imaging tests, such as:  X-rays.  MRI. How is this treated? Treatment for this condition may include:  Putting heat and cold on the affected area.  Medicines to help relieve pain and relax your muscles (muscle relaxants).  NSAIDs to help reduce swelling and discomfort. When your symptoms improve, it is important to gradually return to your  normal routine as soon as possible to reduce pain, avoid stiffness, and avoid loss of muscle strength. Generally, symptoms should improve within 6 weeks of treatment. However, recovery time varies. Follow these instructions at home: Managing pain, stiffness, and swelling    If directed, put ice on the injured area during the first 24 hours after your strain.  Put ice in a plastic bag.  Place a towel between your skin and the bag.  Leave the ice on for 20 minutes, 2-3 times a day.  If directed, put heat on the affected area as often as told by your health care provider. Use the heat source that your health care provider recommends, such as a moist heat pack or a heating pad.  Place a towel between your skin and the heat source.  Leave the heat on for 20-30 minutes.  Remove the heat if your skin turns bright red. This is especially important if you are unable to feel pain, heat, or cold. You may have a greater risk of getting burned. Activity   Rest and return to your normal activities as told by your health care provider. Ask your health care provider what activities are safe for you.  Avoid activities that take a lot of energy for as long as told by your health care provider. General instructions   Take over-the-counter and prescription medicines only as told by your health care provider.  Donot drive or use heavy machinery while taking prescription pain medicine.  Do not use any products that contain  nicotine or tobacco, such as cigarettes and e-cigarettes. If you need help quitting, ask your health care provider.  Keep all follow-up visits as told by your health care provider. This is important. How is this prevented?  Use correct form when playing sports and lifting heavy objects.  Use good posture when sitting and standing.  Maintain a healthy weight.  Sleep on a mattress with medium firmness to support your back.  Be safe and responsible while being active to avoid  falls.  Do at least 150 minutes of moderate-intensity exercise each week, such as brisk walking or water aerobics. Try a form of exercise that takes stress off your back, such as swimming or stationary cycling.  Maintain physical fitness, including:  Strength.  Flexibility.  Cardiovascular fitness.  Endurance. Contact a health care provider if:  Your back pain does not improve after 6 weeks of treatment.  Your symptoms get worse. Get help right away if:  Your back pain is severe.  You cannot stand or walk.  You have difficulty controlling when you urinate or when you have a bowel movement.  You feel nauseous or you vomit.  Your feet get very cold.  You have numbness, tingling, weakness, or problems using your arms or legs.  You develop any of the following:  Shortness of breath.  Dizziness.  Pain in your legs.  Weakness in your buttocks or legs.  Discoloration of the skin on your toes or legs. This information is not intended to replace advice given to you by your health care provider. Make sure you discuss any questions you have with your health care provider. Document Released: 03/24/2005 Document Revised: 01/02/2016 Document Reviewed: 11/16/2015 Elsevier Interactive Patient Education  2017 [employer]

## 2016-11-10 NOTE — Telephone Encounter (Signed)
Pt insurance does not cover cyclobenzaprine (FLEXERIL) 5 MG tablet Would like something else sent in.  [facility] on [address]

## 2016-12-28 DIAGNOSIS — J329 Chronic sinusitis, unspecified: Secondary | ICD-10-CM | POA: Insufficient documentation

## 2017-01-11 ENCOUNTER — Other Ambulatory Visit: Payer: Self-pay | Admitting: Internal Medicine

## 2017-01-11 DIAGNOSIS — F329 Major depressive disorder, single episode, unspecified: Secondary | ICD-10-CM

## 2017-01-11 DIAGNOSIS — F32A Depression, unspecified: Secondary | ICD-10-CM

## 2017-01-13 ENCOUNTER — Telehealth: Payer: Self-pay | Admitting: Internal Medicine

## 2017-01-13 DIAGNOSIS — F329 Major depressive disorder, single episode, unspecified: Secondary | ICD-10-CM

## 2017-01-13 DIAGNOSIS — F32A Depression, unspecified: Secondary | ICD-10-CM

## 2017-01-13 DIAGNOSIS — F3289 Other specified depressive episodes: Secondary | ICD-10-CM

## 2017-01-20 MED ORDER — VENLAFAXINE HCL 75 MG PO TABS: 75.0000 mg | ORAL_TABLET | Freq: Two times a day (BID) | ORAL | 0 refills | Status: DC

## 2017-01-20 NOTE — Telephone Encounter (Signed)
Reviewed chart pt is due for annual appt sent 30 day script to [facility] until appt...[name]/lmb

## 2017-01-20 NOTE — Telephone Encounter (Signed)
Pt called in and needs a refill on this med, pharmacy was sending it to Dr hopper

## 2017-01-20 NOTE — Addendum Note (Signed)
Addended by: Deatra JamesBRAND, Aalyssa Elderkin M on: 01/20/2017 01:51 PM   Modules accepted: Orders

## 2017-02-22 ENCOUNTER — Ambulatory Visit (INDEPENDENT_AMBULATORY_CARE_PROVIDER_SITE_OTHER): Payer: BLUE CROSS/BLUE SHIELD | Admitting: Family

## 2017-02-22 ENCOUNTER — Encounter: Payer: Self-pay | Admitting: Family

## 2017-02-22 DIAGNOSIS — L299 Pruritus, unspecified: Secondary | ICD-10-CM

## 2017-02-22 DIAGNOSIS — F419 Anxiety disorder, unspecified: Secondary | ICD-10-CM | POA: Diagnosis not present

## 2017-02-22 MED ORDER — PREDNISONE 20 MG PO TABS: ORAL_TABLET | ORAL | 0 refills | Status: DC

## 2017-02-22 MED ORDER — CLONAZEPAM 0.5 MG PO TABS: 0.5000 mg | ORAL_TABLET | Freq: Two times a day (BID) | ORAL | 1 refills | Status: DC | PRN

## 2017-02-22 MED ORDER — HYDROXYZINE PAMOATE 25 MG PO CAPS: 25.0000 mg | ORAL_CAPSULE | Freq: Three times a day (TID) | ORAL | 0 refills | Status: DC | PRN

## 2017-02-22 NOTE — Assessment & Plan Note (Signed)
Recently self decreased venlafaxine down to 37.5 mg twice daily. Has noticed increased levels of anxiety but is considering coming off of medication altogether. Continue current dosage of venlafaxine at 37.5 mg twice daily. Start clonazepam. Continue to work on decreasing venlafaxine. Continue to monitor.

## 2017-02-22 NOTE — Patient Instructions (Addendum)
Thank you for choosing [facility].  SUMMARY AND INSTRUCTIONS:  Please continue to take the venlafaxine 1/2 tablet twice daily.   For your neck and arms - hydrocortisone cream as needed. Start hydroxyzine as needed.  Start and complete the prednisone taper.   Medication:  Your prescription(s) have been submitted to your pharmacy or been printed and provided for you. Please take as directed and contact our office if you believe you are having problem(s) with the medication(s) or have any questions.  Follow up:  If your symptoms worsen or fail to improve, please contact our office for further instruction, or in case of emergency go directly to the emergency room at the closest medical facility.

## 2017-02-22 NOTE — Progress Notes (Signed)
Subjective:    Patient ID: Kathryn [name], female    DOB: [birth year], 61 y.o.   MRN: [id number]  Chief Complaint  Patient presents with  . Pruritis    having a stinging and itching feeling down both of her arms    HPI:  Kathryn [name] is a 61 y.o. female who  has a past medical history of Anxiety; Diverticulitis (2016); Fracture of fifth metacarpal bone of left hand (09/03/2011); GERD (gastroesophageal reflux disease); and Osteoarthritis. and presents today for an office visit.  1.) Itching and burning - This is a new problem. Associated symptom of a constant stinging and itching feeling have been going down both arms has been going on for about 1 week. Modifying factors include dermaplast which has not helped very much. No changes to skin, hair, or laundering products. No trauma or injury but notes it started after pruning some trees.   2.) Anxiety and depression - Currently prescribed venlafaxine. Reports that she has decreased her medication to 1/2 tablet twice daily and denies adverse side effects. Notes that with changing generics she was having difficulty. Does have increased levels of anxiety since decreasing the dosage.  Allergies  Allergen Reactions  . Penicillins Rash    ? Rash as child      Outpatient Medications Prior to Visit  Medication Sig Dispense Refill  . cefdinir (OMNICEF) 300 MG capsule Take 1 capsule (300 mg total) by mouth 2 (two) times daily. 14 capsule 0  . cetirizine (ZYRTEC) 10 MG tablet Take 1 tablet (10 mg total) by mouth daily. 30 tablet 0  . cyclobenzaprine (FLEXERIL) 5 MG tablet Take 1 tablet (5 mg total) by mouth at bedtime as needed for muscle spasms. 7 tablet 0  . guaiFENesin (MUCINEX) 600 MG 12 hr tablet Take 1 tablet (600 mg total) by mouth 2 (two) times daily as needed for cough or to loosen phlegm. 14 tablet 0  . meloxicam (MOBIC) 7.5 MG tablet Take 1 tablet (7.5 mg total) by mouth daily as needed for pain. 14 tablet 0  . Omeprazole (PRILOSEC  PO) Prilosec    . sodium chloride (OCEAN) 0.65 % SOLN nasal spray Place 1 spray into both nostrils as needed for congestion. 15 mL 0  . venlafaxine (EFFEXOR) 75 MG tablet Take 1 tablet (75 mg total) by mouth 2 (two) times daily. Annual appt is due must see provider for future refills 60 tablet 0  . methylPREDNISolone (MEDROL DOSEPAK) 4 MG TBPK tablet Take as directed on package 21 tablet 0   No facility-administered medications prior to visit.       Past Surgical History:  Procedure Laterality Date  . APPENDECTOMY    . COLONOSCOPY     negative; Dr Jarold Motto  . ORIF FINGER FRACTURE  09/03/2011   Procedure: OPEN REDUCTION INTERNAL FIXATION (ORIF) METACARPAL (FINGER) FRACTURE;  Surgeon: Eulas Post, MD;  Location: [facility];  Service: Orthopedics;  Laterality: Left;  . TUBAL LIGATION        Past Medical History:  Diagnosis Date  . Anxiety   . Diverticulitis 2016  . Fracture of fifth metacarpal bone of left hand 09/03/2011   post fall  . GERD (gastroesophageal reflux disease)   . Osteoarthritis       Review of Systems  Constitutional: Negative for chills and fever.  Respiratory: Negative for chest tightness and shortness of breath.   Psychiatric/Behavioral: Negative for dysphoric mood. The patient is nervous/anxious.  Objective:    BP (!) 154/92 (BP Location: Left Arm, Patient Position: Sitting, Cuff Size: Normal)   Pulse 86   Temp 98.6 F (37 C) (Oral)   Resp 16   Ht 5' 6.5" (1.689 m)   Wt 154 lb (69.9 kg)   SpO2 95%   BMI 24.48 kg/m  Nursing note and vital signs reviewed.  Physical Exam  Constitutional: She is oriented to person, place, and time. She appears well-developed and well-nourished. No distress.  Neck:  No obvious deformity, discoloration, or edema. There is increased muscle spasms bilaterally of the upper trapezius more prominent on the left side. Range of motion within normal limits. Distal pulses and sensation are intact and  appropriate.  Cardiovascular: Normal rate, regular rhythm, normal heart sounds and intact distal pulses.   Pulmonary/Chest: Effort normal and breath sounds normal.  Neurological: She is alert and oriented to person, place, and time.  Skin: Skin is warm and dry.  Psychiatric: Her behavior is normal. Judgment and thought content normal. Her mood appears anxious.       Assessment & Plan:   Problem List Items Addressed This Visit      Other   Anxiety    Recently self decreased venlafaxine down to 37.5 mg twice daily. Has noticed increased levels of anxiety but is considering coming off of medication altogether. Continue current dosage of venlafaxine at 37.5 mg twice daily. Start clonazepam. Continue to work on decreasing venlafaxine. Continue to monitor.      Relevant Medications   hydrOXYzine (VISTARIL) 25 MG capsule   Itching    Itching and burning of neck with concern for nerve irritation or possible early shingles. Start prednisone taper and hydroxyzine. If symptoms worsen or rash develops will add Valacyclovir. Follow up if symptoms worsen or do not improve.           I have discontinued Ms. Bostwick's methylPREDNISolone. I am also having her start on hydrOXYzine, predniSONE, and clonazePAM. Additionally, I am having her maintain her Omeprazole (PRILOSEC PO), cetirizine, sodium chloride, guaiFENesin, cyclobenzaprine, meloxicam, cefdinir, and venlafaxine.   Meds ordered this encounter  Medications  . hydrOXYzine (VISTARIL) 25 MG capsule    Sig: Take 1 capsule (25 mg total) by mouth 3 (three) times daily as needed.    Dispense:  30 capsule    Refill:  0    Order Specific Question:   Supervising Provider    Answer:   Hillard Danker A [[id number]]  . predniSONE (DELTASONE) 20 MG tablet    Sig: Take 3 tablets by mouth daily for 3 days, then 2 tablets by mouth daily for 3 days, then 1 tablet by mouth daily for 3 days.    Dispense:  18 tablet    Refill:  0    Order Specific Question:    Supervising Provider    Answer:   Hillard Danker A [[id number]]  . clonazePAM (KLONOPIN) 0.5 MG tablet    Sig: Take 1 tablet (0.5 mg total) by mouth 2 (two) times daily as needed for anxiety.    Dispense:  30 tablet    Refill:  1    Order Specific Question:   Supervising Provider    Answer:   Hillard Danker A [[id number]]     Follow-up: Return if symptoms worsen or fail to improve.  Jeanine Luz, FNP

## 2017-02-22 NOTE — Assessment & Plan Note (Signed)
Itching and burning of neck with concern for nerve irritation or possible early shingles. Start prednisone taper and hydroxyzine. If symptoms worsen or rash develops will add Valacyclovir. Follow up if symptoms worsen or do not improve.

## 2017-04-04 ENCOUNTER — Ambulatory Visit (INDEPENDENT_AMBULATORY_CARE_PROVIDER_SITE_OTHER): Payer: BLUE CROSS/BLUE SHIELD | Admitting: Family

## 2017-04-04 ENCOUNTER — Encounter: Payer: Self-pay | Admitting: Family

## 2017-04-04 VITALS — BP 112/80 | HR 85 | Temp 98.6°F | Resp 16 | Ht 66.5 in | Wt 152.0 lb

## 2017-04-04 DIAGNOSIS — Z23 Encounter for immunization: Secondary | ICD-10-CM | POA: Diagnosis not present

## 2017-04-04 DIAGNOSIS — F419 Anxiety disorder, unspecified: Secondary | ICD-10-CM

## 2017-04-04 MED ORDER — SERTRALINE HCL 50 MG PO TABS: 50.0000 mg | ORAL_TABLET | Freq: Every day | ORAL | 1 refills | Status: DC

## 2017-04-04 NOTE — Patient Instructions (Signed)
Thank you for choosing [facility].  SUMMARY AND INSTRUCTIONS:  STOP taking Effexor (venlafaxine) and START taking Zoloft (sertraline).  Continue to take the clonazepam as needed. Consider 1/2 tablet if 1 tablet is too strong.   Follow up in about 1-2 months or sooner if needed for medication adjustment.   Medication:  Your prescription(s) have been submitted to your pharmacy or been printed and provided for you. Please take as directed and contact our office if you believe you are having problem(s) with the medication(s) or have any questions.  Follow up:  If your symptoms worsen or fail to improve, please contact our office for further instruction, or in case of emergency go directly to the emergency room at the closest medical facility.

## 2017-04-04 NOTE — Progress Notes (Signed)
Subjective:    Patient ID: Kathryn [name], female    DOB: 05/24/1956, 61 y.o.   MRN: [id number]  Chief Complaint  Patient presents with  . Medication Management    wants to discuss effexor, having trouble cutting back on meds, feels like she is having more anxiety    HPI:  Kathryn [name] is a 61 y.o. female who  has a past medical history of Anxiety; Diverticulitis (2016); Fracture of fifth metacarpal bone of left hand (09/03/2011); GERD (gastroesophageal reflux disease); and Osteoarthritis. and presents today for a follow up office visit.  Anxiety - Currently prescribed Effexor and clonazepam. Reports that she is taking about 1/2 tablet every other day and has noted increased levels of anxiety with sweating and irritability. Taking the clonazepam at night as it does make her sleepy. Has been on Paxil in the past which resulted in her having some depression.   Allergies  Allergen Reactions  . Penicillins Rash    ? Rash as child      Outpatient Medications Prior to Visit  Medication Sig Dispense Refill  . clonazePAM (KLONOPIN) 0.5 MG tablet Take 1 tablet (0.5 mg total) by mouth 2 (two) times daily as needed for anxiety. 30 tablet 1  . cyclobenzaprine (FLEXERIL) 5 MG tablet Take 1 tablet (5 mg total) by mouth at bedtime as needed for muscle spasms. 7 tablet 0  . Omeprazole (PRILOSEC PO) Prilosec    . venlafaxine (EFFEXOR) 75 MG tablet Take 1 tablet (75 mg total) by mouth 2 (two) times daily. Annual appt is due must see provider for future refills 60 tablet 0  . cefdinir (OMNICEF) 300 MG capsule Take 1 capsule (300 mg total) by mouth 2 (two) times daily. 14 capsule 0  . cetirizine (ZYRTEC) 10 MG tablet Take 1 tablet (10 mg total) by mouth daily. 30 tablet 0  . guaiFENesin (MUCINEX) 600 MG 12 hr tablet Take 1 tablet (600 mg total) by mouth 2 (two) times daily as needed for cough or to loosen phlegm. 14 tablet 0  . hydrOXYzine (VISTARIL) 25 MG capsule Take 1 capsule (25 mg total) by  mouth 3 (three) times daily as needed. 30 capsule 0  . meloxicam (MOBIC) 7.5 MG tablet Take 1 tablet (7.5 mg total) by mouth daily as needed for pain. 14 tablet 0  . predniSONE (DELTASONE) 20 MG tablet Take 3 tablets by mouth daily for 3 days, then 2 tablets by mouth daily for 3 days, then 1 tablet by mouth daily for 3 days. 18 tablet 0  . sodium chloride (OCEAN) 0.65 % SOLN nasal spray Place 1 spray into both nostrils as needed for congestion. 15 mL 0   No facility-administered medications prior to visit.     Past Medical History:  Diagnosis Date  . Anxiety   . Diverticulitis 2016  . Fracture of fifth metacarpal bone of left hand 09/03/2011   post fall  . GERD (gastroesophageal reflux disease)   . Osteoarthritis       Review of Systems  Constitutional: Negative for chills and fever.  Respiratory: Negative for chest tightness, shortness of breath and wheezing.   Cardiovascular: Negative for chest pain, palpitations and leg swelling.  Psychiatric/Behavioral: Positive for dysphoric mood. Negative for behavioral problems, decreased concentration, hallucinations, self-injury, sleep disturbance and suicidal ideas. The patient is nervous/anxious. The patient is not hyperactive.       Objective:    BP 112/80 (BP Location: Left Arm, Patient Position: Sitting, Cuff Size: Normal)  Pulse 85   Temp 98.6 F (37 C) (Oral)   Resp 16   Ht 5' 6.5" (1.689 m)   Wt 152 lb (68.9 kg)   SpO2 96%   BMI 24.17 kg/m  Nursing note and vital signs reviewed.  Physical Exam  Constitutional: She is oriented to person, place, and time. She appears well-developed and well-nourished. No distress.  Cardiovascular: Normal rate, regular rhythm, normal heart sounds and intact distal pulses.  Exam reveals no gallop and no friction rub.   No murmur heard. Pulmonary/Chest: Effort normal and breath sounds normal. No respiratory distress. She has no wheezes. She has no rales. She exhibits no tenderness.    Neurological: She is alert and oriented to person, place, and time.  Skin: Skin is warm and dry.  Psychiatric: Her behavior is normal. Judgment and thought content normal. Her mood appears anxious.       Assessment & Plan:   Problem List Items Addressed This Visit      Other   Anxiety - Primary    Experiencing increased levels of anxiety as she tapers down from venlafaxine. Discontinue venlafaxine start sertraline. Continue current dosage of clonazepam. Encouraged stress and stress management techniques. Declines counseling. [place] controlled substance database reviewed with no irregularities. Follow-up in one month or sooner if needed.      Relevant Medications   sertraline (ZOLOFT) 50 MG tablet    Other Visit Diagnoses    Need for influenza vaccination       Relevant Orders   Flu Vaccine QUAD 36+ mos IM (Completed)   Need for Tdap vaccination       Relevant Orders   Tdap vaccine greater than or equal to 7yo IM (Completed)       I have discontinued Ms. Sek's cetirizine, sodium chloride, guaiFENesin, meloxicam, cefdinir, venlafaxine, hydrOXYzine, and predniSONE. I am also having her start on sertraline. Additionally, I am having her maintain her Omeprazole (PRILOSEC PO), cyclobenzaprine, clonazePAM, and loratadine.   Meds ordered this encounter  Medications  . loratadine (CLARITIN) 10 MG tablet    Sig: Take 10 mg by mouth daily.  . sertraline (ZOLOFT) 50 MG tablet    Sig: Take 1 tablet (50 mg total) by mouth daily.    Dispense:  30 tablet    Refill:  1    Order Specific Question:   Supervising Provider    Answer:   Hillard Danker A [[id number]]     Follow-up: Return in about 1 month (around 05/05/2017), or if symptoms worsen or fail to improve.  Jeanine Luz, FNP

## 2017-04-04 NOTE — Assessment & Plan Note (Signed)
Experiencing increased levels of anxiety as she tapers down from venlafaxine. Discontinue venlafaxine start sertraline. Continue current dosage of clonazepam. Encouraged stress and stress management techniques. Declines counseling. [place] controlled substance database reviewed with no irregularities. Follow-up in one month or sooner if needed.

## 2017-05-12 ENCOUNTER — Ambulatory Visit: Payer: BLUE CROSS/BLUE SHIELD | Admitting: Family Medicine

## 2017-05-12 ENCOUNTER — Encounter: Payer: Self-pay | Admitting: Family Medicine

## 2017-05-12 VITALS — BP 130/80 | HR 89 | Temp 99.1°F | Ht 66.5 in | Wt 152.0 lb

## 2017-05-12 DIAGNOSIS — K5733 Diverticulitis of large intestine without perforation or abscess with bleeding: Secondary | ICD-10-CM

## 2017-05-12 MED ORDER — METRONIDAZOLE 500 MG PO TABS: 500.0000 mg | ORAL_TABLET | Freq: Three times a day (TID) | ORAL | 0 refills | Status: DC

## 2017-05-12 MED ORDER — CIPROFLOXACIN HCL 500 MG PO TABS: 500.0000 mg | ORAL_TABLET | Freq: Two times a day (BID) | ORAL | 0 refills | Status: DC

## 2017-05-12 NOTE — Patient Instructions (Addendum)

## 2017-05-12 NOTE — Progress Notes (Signed)
   Subjective:    Patient ID: Kathryn [name], female    DOB: 04/07/1956, 61 y.o.   MRN: [id number]  HPI 2 day ho lower abd pain located primarily on the left. She has had elevated temp with some malaise and myalgia. Her stool this morning had pinkish streaks. She denies n/v, dysuria, vag dc or diarrhea.     Review of Systems  Constitutional: Positive for chills, fatigue and fever.  HENT: Negative.   Eyes: Negative.   Respiratory: Negative.   Cardiovascular: Negative.   Gastrointestinal: Positive for abdominal pain. Negative for anal bleeding, constipation, diarrhea, nausea and rectal pain.  Endocrine: Negative for polyuria.  Genitourinary: Negative for flank pain, frequency, urgency, vaginal bleeding and vaginal discharge.  Musculoskeletal: Positive for myalgias. Negative for arthralgias.  Skin: Negative for rash.  Neurological: Negative.   Hematological: Negative.   Psychiatric/Behavioral: Negative.       soc hx, pmh fh reviewed. Objective:   Physical Exam  Constitutional: She is oriented to person, place, and time. She appears well-developed and well-nourished. No distress.  HENT:  Head: Normocephalic and atraumatic.  Right Ear: External ear normal.  Left Ear: External ear normal.  Mouth/Throat: Oropharynx is clear and moist. No oropharyngeal exudate.  Eyes: Conjunctivae are normal. Pupils are equal, round, and reactive to light. Right eye exhibits no discharge. Left eye exhibits no discharge. No scleral icterus.  Neck: Neck supple. No JVD present. No tracheal deviation present. No thyromegaly present.  Cardiovascular: Normal rate, regular rhythm and normal heart sounds.  Pulmonary/Chest: Effort normal and breath sounds normal. No stridor.  Abdominal: Bowel sounds are normal. She exhibits no distension and no mass. There is tenderness (lower mid to left oriented ttp.). There is no rebound and no guarding.  Musculoskeletal: She exhibits no edema.  Lymphadenopathy:    She has  no cervical adenopathy.  Neurological: She is alert and oriented to person, place, and time.  Skin: Skin is warm and dry. She is not diaphoretic.  Psychiatric: She has a normal mood and affect. Her behavior is normal.          Assessment & Plan:

## 2017-05-16 ENCOUNTER — Telehealth: Payer: Self-pay | Admitting: Family Medicine

## 2017-05-16 DIAGNOSIS — K5792 Diverticulitis of intestine, part unspecified, without perforation or abscess without bleeding: Secondary | ICD-10-CM

## 2017-05-16 DIAGNOSIS — T887XXA Unspecified adverse effect of drug or medicament, initial encounter: Secondary | ICD-10-CM

## 2017-05-16 MED ORDER — AMOXICILLIN 500 MG PO CAPS: 500.0000 mg | ORAL_CAPSULE | Freq: Three times a day (TID) | ORAL | 0 refills | Status: DC

## 2017-05-16 NOTE — Assessment & Plan Note (Signed)
Pt called to report that she is having stomach upset after taking flagyl and cipro. This is mostl likely due to flagyl. DC flagyl. Will add amoxil 500mg  tid for 10 days. She has a ho rash with pcn but has taken amoxil in the past without issue.

## 2017-05-16 NOTE — Telephone Encounter (Signed)
Have dc'd flagyl and sent rx for amoxil to pharm. Please have pt fu at clinic with any issues.

## 2017-05-16 NOTE — Telephone Encounter (Signed)
Notified pt w/MD response. Pt has new appt schedule to see Kathryn [name] on 06/03/17...[name]/lmb

## 2017-05-16 NOTE — Telephone Encounter (Signed)
Patient states she seen Dr. Doreene BurkeKremer at [facility] last week.  States that she was prescribed cipro and flagyl.  Patient states she can't sleep, she is sick to her stomach, diarrhea, and weak.  Would like to know if something else can be sent in place of these two meds or if she could stop one.  Patient uses [facility] on [address]

## 2017-05-26 ENCOUNTER — Other Ambulatory Visit (INDEPENDENT_AMBULATORY_CARE_PROVIDER_SITE_OTHER): Payer: BLUE CROSS/BLUE SHIELD

## 2017-05-26 ENCOUNTER — Ambulatory Visit: Payer: Self-pay

## 2017-05-26 ENCOUNTER — Encounter: Payer: Self-pay | Admitting: Internal Medicine

## 2017-05-26 ENCOUNTER — Ambulatory Visit: Payer: BLUE CROSS/BLUE SHIELD | Admitting: Internal Medicine

## 2017-05-26 DIAGNOSIS — R1032 Left lower quadrant pain: Secondary | ICD-10-CM

## 2017-05-26 LAB — CBC
HEMATOCRIT: 38 % (ref 36.0–46.0)
Hemoglobin: 12.8 g/dL (ref 12.0–15.0)
MCHC: 33.7 g/dL (ref 30.0–36.0)
MCV: 90.5 fl (ref 78.0–100.0)
Platelets: 257 10*3/uL (ref 150.0–400.0)
RBC: 4.19 Mil/uL (ref 3.87–5.11)
RDW: 13.4 % (ref 11.5–15.5)
WBC: 10.1 10*3/uL (ref 4.0–10.5)

## 2017-05-26 LAB — COMPREHENSIVE METABOLIC PANEL
ALT: 51 U/L — ABNORMAL HIGH (ref 0–35)
AST: 33 U/L (ref 0–37)
Albumin: 4.2 g/dL (ref 3.5–5.2)
Alkaline Phosphatase: 72 U/L (ref 39–117)
BUN: 10 mg/dL (ref 6–23)
CHLORIDE: 103 meq/L (ref 96–112)
CO2: 29 mEq/L (ref 19–32)
Calcium: 9.4 mg/dL (ref 8.4–10.5)
Creatinine, Ser: 0.84 mg/dL (ref 0.40–1.20)
GFR: 73.59 mL/min (ref >60.00)
GLUCOSE: 113 mg/dL — AB (ref 70–99)
POTASSIUM: 4.4 meq/L (ref 3.5–5.1)
SODIUM: 139 meq/L (ref 135–145)
Total Bilirubin: 0.6 mg/dL (ref 0.2–1.2)
Total Protein: 7.4 g/dL (ref 6.0–8.3)

## 2017-05-26 LAB — LIPASE: Lipase: 9 U/L — ABNORMAL LOW (ref 11.0–59.0)

## 2017-05-26 NOTE — Assessment & Plan Note (Signed)
Prior clinical diagnosis with some resolution and recurrence. Ordered CBC, CMP, CT abdomen. Once passed needs updated colonoscopy as she is overdue. Hold off on more antibiotics at this time.

## 2017-05-26 NOTE — Patient Instructions (Signed)
We will check the labs today and get the CT scan done to check on the pain since it is not getting better as it should.

## 2017-05-26 NOTE — Progress Notes (Signed)
   Subjective:    Patient ID: Kathryn [name], female    DOB: 03/19/1956, 61 y.o.   MRN: [id number]  HPI The patient is a 61 YO female coming in for abdominal pain which is ongoing. She was seen about 2 weeks ago for LLQ abdominal pain with diarrhea. She was started on cipro and flagyl which she took for about 3-4 days. She was having side effects from it and felt worse than from the stomach pain. She called in and was sent in amoxicillin and was advised to stop flagyl. However she misunderstood and stopped both medications and started the amoxicillin. She has been taking that since this time. She did feel better with her stomach while taking the medications. Since being on amoxicillin only she is having some worsening pain. Now 3/10 all the time with occasional 7/10 flares with certain movements. Now having normal BM. Denies blood in stool, no nausea or vomiting. Eating and drinking well. Last colonoscopy 2004 without mention of diverticulosis. No prior CT scan. Does have some URI symptoms and is coughing a lot lately.  Review of Systems  Constitutional: Negative.   Respiratory: Positive for cough. Negative for chest tightness and shortness of breath.   Cardiovascular: Negative for chest pain, palpitations and leg swelling.  Gastrointestinal: Positive for abdominal pain. Negative for abdominal distention, blood in stool, constipation, diarrhea, nausea and vomiting.  Musculoskeletal: Negative.   Skin: Negative.   Neurological: Negative.   Psychiatric/Behavioral: Negative.       Objective:   Physical Exam  Constitutional: She is oriented to person, place, and time. She appears well-developed and well-nourished.  HENT:  Head: Normocephalic and atraumatic.  Eyes: EOM are normal.  Neck: Normal range of motion.  Cardiovascular: Normal rate and regular rhythm.  Pulmonary/Chest: Effort normal and breath sounds normal. No respiratory distress. She has no wheezes. She has no rales.  Abdominal: Soft.  Bowel sounds are normal. She exhibits no distension and no mass. There is tenderness. There is no rebound and no guarding.  Mild tenderness LLQ with palpation but no rebound or guarding  Musculoskeletal: She exhibits no edema.  Neurological: She is alert and oriented to person, place, and time. Coordination normal.  Skin: Skin is warm and dry.  Psychiatric: She has a normal mood and affect.   Vitals:   05/26/17 1031  BP: 100/70  Pulse: (!) 102  Temp: 98.5 F (36.9 C)  TempSrc: Oral  SpO2: 99%  Weight: 146 lb (66.2 kg)  Height: 5' 6.5" (1.689 m)      Assessment & Plan:

## 2017-05-26 NOTE — Telephone Encounter (Signed)
  Reason for Disposition . [1] MODERATE pain (e.g., interferes with normal activities) AND [2] pain comes and goes (cramps) AND [3] present > 24 hours  (Exception: pain with Vomiting or Diarrhea - see that Guideline)  Answer Assessment - Initial Assessment Questions 1. LOCATION: "Where does it hurt?"      Pain left lower quadrant 2. RADIATION: "Does the pain shoot anywhere else?" (e.g., chest, back)     Goes around to the side 3. ONSET: "When did the pain begin?" (e.g., minutes, hours or days ago)      Yesterday 4. SUDDEN: "Gradual or sudden onset?"     Gradual 5. PATTERN "Does the pain come and go, or is it constant?"    - If constant: "Is it getting better, staying the same, or worsening?"      (Note: Constant means the pain never goes away completely; most serious pain is constant and it progresses)     - If intermittent: "How long does it last?" "Do you have pain now?"     (Note: Intermittent means the pain goes away completely between bouts)     Tender - constant 6. SEVERITY: "How bad is the pain?"  (e.g., Scale 1-10; mild, moderate, or severe)   - MILD (1-3): doesn't interfere with normal activities, abdomen soft and not tender to touch    - MODERATE (4-7): interferes with normal activities or awakens from sleep, tender to touch    - SEVERE (8-10): excruciating pain, doubled over, unable to do any normal activities      7 7. RECURRENT SYMPTOM: "Have you ever had this type of abdominal pain before?" If so, ask: "When was the last time?" and "What happened that time?"      Yes- diverticulitis 8. CAUSE: "What do you think is causing the abdominal pain?"     Diverticulitis 9. RELIEVING/AGGRAVATING FACTORS: "What makes it better or worse?" (e.g., movement, antacids, bowel movement)     Heating pad helps a little 10. OTHER SYMPTOMS: "Has there been any vomiting, diarrhea, constipation, or urine problems?"       No 11. PREGNANCY: "Is there any chance you are pregnant?" "When was your  last menstrual period?"       No  Protocols used: ABDOMINAL PAIN - FEMALE-A-AH States she was put on Flagyl and Cipro "but that was too strong for me and made me sick, so I stopped the medicine."

## 2017-05-28 ENCOUNTER — Encounter: Payer: Self-pay | Admitting: Family Medicine

## 2017-05-28 ENCOUNTER — Ambulatory Visit: Payer: BLUE CROSS/BLUE SHIELD | Admitting: Family Medicine

## 2017-05-28 VITALS — HR 100 | Temp 97.6°F | Ht 66.5 in | Wt 145.0 lb

## 2017-05-28 DIAGNOSIS — L270 Generalized skin eruption due to drugs and medicaments taken internally: Secondary | ICD-10-CM

## 2017-05-28 MED ORDER — TRIAMCINOLONE ACETONIDE 0.1 % EX CREA: TOPICAL_CREAM | Freq: Two times a day (BID) | CUTANEOUS | 0 refills | Status: DC

## 2017-05-28 NOTE — Progress Notes (Signed)
HPI:  Acute visit for drug rash: -History of allergic reaction to amoxicillin as a child, then tolerated a cephalosporin -Recently undergoing treatment for diverticulitis and was given amoxicillin she did not tolerate other options -Towards the end of the course of treatment she developed an itchy rash on the arms and trunk primarily -this is been going on for the last several days  -She finally finished the amoxicillin yesterday -Denies fevers, shortness of breath, swelling of the face or mouth, nausea or vomiting  ROS: See pertinent positives and negatives per HPI.  Past Medical History:  Diagnosis Date  . Anxiety   . Diverticulitis 2016  . Fracture of fifth metacarpal bone of left hand 09/03/2011   post fall  . GERD (gastroesophageal reflux disease)   . Osteoarthritis     Past Surgical History:  Procedure Laterality Date  . APPENDECTOMY    . COLONOSCOPY     negative; Dr Jarold MottoPatterson  . ORIF FINGER FRACTURE  09/03/2011   Procedure: OPEN REDUCTION INTERNAL FIXATION (ORIF) METACARPAL (FINGER) FRACTURE;  Surgeon: Eulas PostJoshua P Landau, MD;  Location: [facility];  Service: Orthopedics;  Laterality: Left;  . TUBAL LIGATION      Family History  Problem Relation Age of Onset  . Colitis Mother   . COPD Mother   . Heart attack Mother [id number]  . Heart disease Maternal Grandfather   . Breast cancer Unknown        MGaunt  . Diabetes Neg Hx   . Stroke Neg Hx   . Colon cancer Neg Hx   . Colon polyps Neg Hx   . Kidney disease Neg Hx   . Gallbladder disease Neg Hx   . Esophageal cancer Neg Hx     Social History   Socioeconomic History  . Marital status: Married    Spouse name: None  . Number of children: 2  . Years of education: None  . Highest education level: None  Social Needs  . Financial resource strain: None  . Food insecurity - worry: None  . Food insecurity - inability: None  . Transportation needs - medical: None  . Transportation needs - non-medical: None   Occupational History  . Occupation: Stay at home mom  Tobacco Use  . Smoking status: Never Smoker  . Smokeless tobacco: Never Used  Substance and Sexual Activity  . Alcohol use: No    Alcohol/week: 0.0 oz  . Drug use: No  . Sexual activity: None  Other Topics Concern  . None  Social History Narrative  . None     Current Outpatient Medications:  .  amoxicillin (AMOXIL) 500 MG capsule, Take 1 capsule (500 mg total) 3 (three) times daily by mouth., Disp: 30 capsule, Rfl: 0 .  ciprofloxacin (CIPRO) 500 MG tablet, Take 1 tablet (500 mg total) 2 (two) times daily by mouth., Disp: 20 tablet, Rfl: 0 .  Omeprazole (PRILOSEC PO), Prilosec, Disp: , Rfl:  .  sertraline (ZOLOFT) 50 MG tablet, Take 1 tablet (50 mg total) by mouth daily., Disp: 30 tablet, Rfl: 1 .  triamcinolone cream (KENALOG) 0.1 %, Apply topically 2 (two) times daily., Disp: 45 g, Rfl: 0  EXAM:  Vitals:   05/28/17 1057  Pulse: 100  Temp: 97.6 F (36.4 C)  SpO2: 99%    Body mass index is 23.05 kg/m.  GENERAL: vitals reviewed and listed above, alert, oriented, appears well hydrated and in no acute distress  HEENT: atraumatic, conjunttiva clear, no obvious abnormalities on inspection of external  nose and ears  NECK: no obvious masses on inspection  LUNGS: clear to auscultation bilaterally, no wheezes, rales or rhonchi, good air movement  CV: HRRR, no peripheral edema  MS: moves all extremities without noticeable abnormality  SKIN: maculopapular rash coalescent rash on the chest, back and arms  PSYCH: pleasant and cooperative, no obvious depression or anxiety  ASSESSMENT AND PLAN:  Discussed the following assessment and plan:  Drug rash  -we discussed possible serious and likely etiologies, workup and treatment, treatment risks and return precautions -after this discussion, [place] opted for -classic signs and symptoms of drug eruption, not severe at this point and has stopped offending agent -Treat  with Zyrtec and topical steroid for comfort -of course, we advised Amarianna  to return or notify a doctor immediately if symptoms worsen or persist or new concerns arise.  Patient Instructions  Start Zyrtec once daily at night for the next 2 weeks.  Use a topical steroid as needed 1-2 times daily.  I hope you are feeling better soon! Seek care promptly if your symptoms worsen, new concerns arise or you are not improving with treatment.     Kriste BasqueKIM, Wilmer Berryhill R., DO

## 2017-05-28 NOTE — Patient Instructions (Signed)
Start Zyrtec once daily at night for the next 2 weeks.  Use a topical steroid as needed 1-2 times daily.  I hope you are feeling better soon! Seek care promptly if your symptoms worsen, new concerns arise or you are not improving with treatment.

## 2017-05-30 ENCOUNTER — Ambulatory Visit (INDEPENDENT_AMBULATORY_CARE_PROVIDER_SITE_OTHER)
Admission: RE | Admit: 2017-05-30 | Discharge: 2017-05-30 | Disposition: A | Discharge status: Home / Self Care | Payer: BLUE CROSS/BLUE SHIELD | Source: Ambulatory Visit | Attending: Internal Medicine | Admitting: Internal Medicine

## 2017-05-30 ENCOUNTER — Telehealth: Payer: Self-pay | Admitting: Family

## 2017-05-30 ENCOUNTER — Ambulatory Visit: Payer: Self-pay | Admitting: *Deleted

## 2017-05-30 DIAGNOSIS — R1032 Left lower quadrant pain: Secondary | ICD-10-CM

## 2017-05-30 MED ORDER — IOPAMIDOL (ISOVUE-300) INJECTION 61%
100.0000 mL | Freq: Once | INTRAVENOUS | Status: AC | PRN
  Administered 2017-05-30: 100 mL via INTRAVENOUS

## 2017-05-30 NOTE — Telephone Encounter (Signed)
Routing to Dr. Selena BattenKim are you okay with calling in something for patient for itching or does patient need to be seen again. Please advise. thanks

## 2017-05-30 NOTE — Telephone Encounter (Signed)
Pt. States she was seen Saturday and her rash and itching are "unbearable." Request Prednisone be called in.

## 2017-05-30 NOTE — Telephone Encounter (Signed)
Can you please call and make patient an appointment with someone in the office since her PCP is Tammy SoursGreg. Doctor she saw on Saturday thinks she should be seen by someone here for the issue. States in her AVS to be seen if symptoms get worse. Thank you

## 2017-05-30 NOTE — Telephone Encounter (Signed)
Appt made for tomorrow.

## 2017-05-30 NOTE — Telephone Encounter (Signed)
I think she should follow up with PCP for recheck. She has a lot going on. Thanks.

## 2017-05-30 NOTE — Telephone Encounter (Signed)
Copied from CRM [phone]. Topic: Quick Communication - See Telephone Encounter >> May 30, 2017  8:08 AM Oneal GroutSebastian, Jennifer S wrote: CRM for notification. See Telephone encounter for:  Patient seen on Saturday, still itching so bad. Can prednisone be called in after her CT scan today. [facility] [address] 05/30/17.

## 2017-05-30 NOTE — Telephone Encounter (Signed)
  Reason for Disposition . [1] Itching of unknown cause AND [2] present < 48 hours . [1] Widespread itching AND [2] cause unknown AND [3] present > 48 hours      (Exception: caller knows the cause and can eliminate it)  Answer Assessment - Initial Assessment Questions 1. DESCRIPTION: "Describe the itching you are having."     Feels like a bunch of bugs all over her 2. SEVERITY: "How bad is it?"    - MILD - doesn't interfere with normal activities   - MODERATE-SEVERE: interferes with work, school, sleep, or other activities      Severe 3. SCRATCHING: "Are there any scratch marks? Bleeding?"     Scabbed over areas. Some bleeding 4. ONSET: "When did this begin?"      Friday 5. CAUSE: "What do you think is causing the itching?" (ask about swimming pools, pollen, animals, soaps, etc.)     Possible medication-Amoxicillin 6. OTHER SYMPTOMS: "Do you have any other symptoms?"       No  Protocols used: ITCHING - WIDESPREAD-A-AH  Pt has appt scheduled for 12/4 at 11am. Pt has been seen on Friday for complaints of itching and generalized rash and has received no relief with the itching. Pt given home care advice until seen at appt.

## 2017-05-31 ENCOUNTER — Telehealth: Payer: Self-pay

## 2017-05-31 ENCOUNTER — Encounter: Payer: Self-pay | Admitting: Internal Medicine

## 2017-05-31 ENCOUNTER — Ambulatory Visit: Payer: BLUE CROSS/BLUE SHIELD | Admitting: Internal Medicine

## 2017-05-31 ENCOUNTER — Other Ambulatory Visit: Payer: Self-pay | Admitting: Internal Medicine

## 2017-05-31 VITALS — BP 128/90 | HR 88 | Temp 99.1°F | Resp 16 | Ht 66.5 in | Wt 148.0 lb

## 2017-05-31 DIAGNOSIS — L270 Generalized skin eruption due to drugs and medicaments taken internally: Secondary | ICD-10-CM

## 2017-05-31 DIAGNOSIS — T3795XA Adverse effect of unspecified systemic anti-infective and antiparasitic, initial encounter: Secondary | ICD-10-CM | POA: Diagnosis not present

## 2017-05-31 DIAGNOSIS — T7840XA Allergy, unspecified, initial encounter: Secondary | ICD-10-CM

## 2017-05-31 MED ORDER — HYDROXYZINE HCL 25 MG PO TABS: 25.0000 mg | ORAL_TABLET | ORAL | 0 refills | Status: DC | PRN

## 2017-05-31 MED ORDER — METHYLPREDNISOLONE ACETATE 80 MG/ML IJ SUSP
120.0000 mg | Freq: Once | INTRAMUSCULAR | Status: AC
Start: 2017-05-31 — End: 2017-05-31
  Administered 2017-05-31: 120 mg via INTRAMUSCULAR

## 2017-05-31 NOTE — Patient Instructions (Signed)
Drug Allergy A drug allergy is when the body's disease-fighting system (immune system) reacts badly to a medicine. Drug allergies range from mild to severe, and they can be life-threatening in some cases. Some allergic reactions occur one week or more after you are exposed to a medicine (delayed reaction). A sudden (acute), severe allergic reaction that affects multiple areas of the body is called an anaphylactic reaction (anaphylaxis). Anaphylaxis can be life-threatening. All allergic reactions to a medicine require medical evaluation, even if an allergic reaction appears to be mild (minor). What are the causes? Drug allergies happen when the immune system wrongly identifies a medicine as being harmful. When this happens, the body releases proteins (antibodies) and other compounds, such as histamine, into the bloodstream. This causes swelling in certain tissues and loss of blood pressure to important areas, such as the heart and lungs. Almost any medicine can cause an allergic reaction. Medicines that commonly cause allergic reactions (are common allergens) include:  Penicillin.  Sulfa medicines (sulfonamides).  Medicines that numb certain areas of the body (local anesthetics).  X-ray dyes that contain iodine.  What are the signs or symptoms? Mild Allergic Reaction  Nasal congestion.  Tingling in the mouth.  An itchy, red rash. Severe Allergic Reaction  Swelling of the eyes, lips, face, or tongue.  Swelling of the back of the mouth and the throat.  Wheezing.  A hoarse voice.  Itchy, red, swollen areas of skin (hives).  Dizziness or light-headedness.  Fainting.  Anxiety or confusion.  Abdominal pain.  Difficulty breathing, speaking, or swallowing.  Chest tightness.  Fast or irregular heartbeats (palpitations).  Vomiting.  Diarrhea. How is this diagnosed? This condition is diagnosed from a physical exam and your history of recent exposure to one or more medicines.  You may be referred for follow-up testing by a health care provider who specializes in allergies. This testing can confirm the diagnosis of a drug allergy and determine which medicines you are allergic to. Testing may include:  Skin tests. These may involve: ? Injecting a small amount of the possible allergen between layers of your skin (intradermal injection). ? Applying patches to your skin.  Blood tests.  Drug challenge. For this test, a health care provider gives you a small amount of a medicine in gradual doses while watching for an allergic reaction.  If you are unsure of what caused your allergic reaction, your health care provider may ask you for:  Information about all medicines that you take on a regular basis, including: ? The name of each medicine. ? How much (dosage) and how many times you take each medicine per day. ? The form of each medicine, such as pill, liquid, or injection.  The date and time of your reaction.  How is this treated? There is no cure for allergies. However, an allergic reaction can be treated with:  Medicines that help: ? To reduce pain and swelling (NSAIDs). ? To relieve itching and hives (antihistamines). ? To reduce swelling (corticosteroids).  Respiratory inhalers. These are inhaled medicines that help to widen (dilate) the airways in your lungs.  Injections of medicine that helps to relax the muscles in your airways and tighten your blood vessels (epinephrine).  Severe allergic reactions, such as anaphylaxis, require immediate treatment in a hospital. If you have an anaphylactic reaction, you may need to be hospitalized for observation. You may be prescribed rescue medicines, such as epinephrine. Epinephrine comes in many forms, including what is commonly called an auto-injector "pen" (pre-filled automatic  epinephrine injection device). Follow these instructions at home: If You Have a Severe Allergy  Always keep an auto-injector pen or your  anaphylaxis kit near you. These can be lifesaving if you have a severe reaction. Use your auto-injector pen or anaphylaxis kit as told by your health care provider.  Make sure that you, the members of your household, and your employer know: ? How to use an anaphylaxis kit. ? How to use an auto-injector pen to give you an epinephrine injection.  Replace your epinephrine immediately after you use your auto-injector pen, in case you have another reaction.  Wear a medical alert bracelet or necklace that states your drug allergy, if told by your health care provider. General instructions  Avoidmedicines that you are allergic to.  Take over-the-counter and prescription medicines only as told by your health care provider.  If you were given medicines to treat your reaction, do not drive until your health care provider approves.  If you have hives or a rash: ? Use an over-the-counter antihistamineas told by your health care provider. ? Apply cold, wet cloths (cold compresses) to your skin or take baths or showers in cool water. Avoid hot water.  If you had tests done, it is your responsibility to get your test results. Ask your health care provider when your results will be ready.  Tell any health care providers who care for you that you have a drug allergy.  Keep all follow-up visits as told by your health care provider. This is important. Contact a health care provider if:  You think that you are having an allergic reaction. Symptoms of an allergic reaction usually start within 30 minutes after you are exposed to a medicine.  You have symptoms that last more than 2 days after your reaction.  Your symptoms get worse.  You develop new symptoms. Get help right away if:  You needed to use epinephrine. ? An epinephrine injection helps to manage life-threatening allergic reactions, but you still need to go to the emergency room even if epinephrine seems to work. This is important because  anaphylaxis may happen again within 72 hours (rebound anaphylaxis). ? If you used epinephrine to treat anaphylaxis outside of the hospital, you need additional medical care. This may include more doses of epinephrine.  You develop symptoms of a severe allergic reaction. These symptoms may represent a serious problem that is an emergency. Do not wait to see if the symptoms will go away. Use your auto-injector pen or anaphylaxis kit as you have been instructed, and get medical help right away. Call your local emergency services (911 in the [place] Do not drive yourself to the hospital. This information is not intended to replace advice given to you by your health care provider. Make sure you discuss any questions you have with your health care provider. Document Released: 06/14/2005 Document Revised: 10/24/2015 Document Reviewed: 01/14/2015 Elsevier Interactive Patient Education  [employer].

## 2017-05-31 NOTE — Telephone Encounter (Signed)
[facility] CT report of abdomen pelvis from yesterday

## 2017-05-31 NOTE — Telephone Encounter (Signed)
error 

## 2017-05-31 NOTE — Progress Notes (Signed)
Subjective:  Patient ID: Kathryn [name], female    DOB: 01/12/1956  Age: 61 y.o. MRN: [id number]  CC: Rash   HPI Kathryn [name] presents for concerns about a pruritic rash that started on her chest about 5 days ago.  It eventually spread to her entire torso, abdomen and extremities.  She was seen by someone else a few days ago and a topical steroid was offered.  That has provided no symptom relief.  She is miserable with rash, itching, redness, and swelling.  She has no involvement of her eyes, mouth, or palms or soles.  She has had no swelling in her throat and denies cough, wheezing, or shortness of breath.  The rash started as she was completing a course of amoxicillin.  Outpatient Medications Prior to Visit  Medication Sig Dispense Refill  . Omeprazole (PRILOSEC PO) Prilosec    . sertraline (ZOLOFT) 50 MG tablet Take 1 tablet (50 mg total) by mouth daily. 30 tablet 1  . amoxicillin (AMOXIL) 500 MG capsule Take 1 capsule (500 mg total) 3 (three) times daily by mouth. 30 capsule 0  . ciprofloxacin (CIPRO) 500 MG tablet Take 1 tablet (500 mg total) 2 (two) times daily by mouth. 20 tablet 0  . triamcinolone cream (KENALOG) 0.1 % Apply topically 2 (two) times daily. 45 g 0   No facility-administered medications prior to visit.     ROS Review of Systems  Constitutional: Negative.  Negative for appetite change, chills, diaphoresis, fatigue and fever.  HENT: Negative.  Negative for facial swelling, sinus pressure and trouble swallowing.   Eyes: Negative.   Respiratory: Negative.  Negative for cough, shortness of breath and wheezing.   Cardiovascular: Negative.  Negative for chest pain.  Gastrointestinal: Negative for abdominal pain, diarrhea, nausea and vomiting.  Endocrine: Negative.   Genitourinary: Negative.  Negative for difficulty urinating.  Musculoskeletal: Negative.  Negative for arthralgias and myalgias.  Skin: Positive for color change and rash. Negative for pallor and wound.    Allergic/Immunologic: Negative.   Neurological: Negative.  Negative for dizziness, weakness, numbness and headaches.  Hematological: Negative for adenopathy. Does not bruise/bleed easily.  Psychiatric/Behavioral: Negative.     Objective:  BP 128/90 (BP Location: Left Arm, Patient Position: Sitting, Cuff Size: Normal)   Pulse 88   Temp 99.1 F (37.3 C) (Oral)   Resp 16   Ht 5' 6.5" (1.689 m)   Wt 148 lb (67.1 kg)   SpO2 99%   BMI 23.53 kg/m   BP Readings from Last 3 Encounters:  05/31/17 128/90  05/26/17 100/70  05/12/17 130/80    Wt Readings from Last 3 Encounters:  05/31/17 148 lb (67.1 kg)  05/28/17 145 lb (65.8 kg)  05/26/17 146 lb (66.2 kg)    Physical Exam  Constitutional: She is oriented to person, place, and time.  HENT:  Mouth/Throat: Oropharynx is clear and moist. No oropharyngeal exudate.  Eyes: Conjunctivae are normal. Left eye exhibits no discharge. No scleral icterus.  Neck: Normal range of motion. Neck supple.  Cardiovascular: Normal rate, regular rhythm and normal heart sounds.  No murmur heard. Pulmonary/Chest: Effort normal and breath sounds normal. No respiratory distress. She has no wheezes. She has no rales.  Abdominal: Soft. Bowel sounds are normal. There is no tenderness. There is no guarding.  Musculoskeletal: Normal range of motion. She exhibits no edema, tenderness or deformity.  Lymphadenopathy:    She has no cervical adenopathy.  Neurological: She is alert and oriented to person, place,  and time.  Skin: Skin is warm and dry. Rash noted. She is not diaphoretic. There is erythema. No pallor.  She has an erythematous maculopapular rash with confluence over the torso, abdomen, and extremities.  There are no targets or vesicles.  There is no breakdown of the skin.  There is no involvement of the mucous membranes of the palms or soles.  Vitals reviewed.   Lab Results  Component Value Date   WBC 10.1 05/26/2017   HGB 12.8 05/26/2017   HCT  38.0 05/26/2017   PLT 257.0 05/26/2017   GLUCOSE 113 (H) 05/26/2017   CHOL 181 12/04/2014   TRIG 195 (H) 12/04/2014   HDL 44 12/04/2014   LDLDIRECT 130.8 01/08/2013   LDLCALC 98 12/04/2014   ALT 51 (H) 05/26/2017   AST 33 05/26/2017   NA 139 05/26/2017   K 4.4 05/26/2017   CL 103 05/26/2017   CREATININE 0.84 05/26/2017   BUN 10 05/26/2017   CO2 29 05/26/2017   TSH 4.26 09/10/2015   INR 1.0 09/10/2015    Ct Abdomen Pelvis W Contrast  Result Date: 05/30/2017 CLINICAL DATA:  61 year old female with left [name] quadrant abdominal tenderness for 3 weeks. Antibiotics with some relief. Prior appendectomy. Initial encounter. EXAM: CT ABDOMEN AND PELVIS WITH CONTRAST TECHNIQUE: Multidetector CT imaging of the abdomen and pelvis was performed using the standard protocol following bolus administration of intravenous contrast. CONTRAST:  100mL ISOVUE-300 IOPAMIDOL (ISOVUE-300) INJECTION 61% COMPARISON:  None. FINDINGS: [name] chest: Minimal scarring lung bases. Heart size within normal limits. Hepatobiliary: Left lobe liver 1.2 cm cyst. Focal fatty infiltration adjacent to the fissure for the falciform ligament. No worrisome hepatic lesion. No calcified gallstones. Pancreas: No pancreatic mass or inflammation. Spleen: No splenic mass or enlargement. Adrenals/Urinary Tract: No obstructing stone or hydronephrosis. No worrisome renal or adrenal lesion. Noncontrast filled imaging of the urinary bladder without gross abnormality. Stomach/Bowel: Prominent diverticula most notable descending colon and sigmoid colon with associated muscular hypertrophy. Additionally, subtle hazy infiltration of fat planes surrounding distal descending colon and circumferential thickening proximal sigmoid colon consistent with changes of mild diverticulitis without drainable abscess or free intraperitoneal air. The bowel wall thickening limits evaluation for detection of underlying mass. Under distended stomach contains ingested  material. No primary small bowel abnormality noted. Vascular/Lymphatic: Mild aortic calcified plaque without aneurysm or large vessel occlusion. No adenopathy. Reproductive: No worrisome uterine or adnexal abnormality Other: No bowel containing hernia. Musculoskeletal: Curvature lumbar spine convex left. Lumbar degenerative changes most notable right L4-5 facet joint. No osseous destructive lesion. IMPRESSION: Mild descending colon/proximal sigmoid colon diverticulitis without drainable abscess or free intraperitoneal air. The bowel wall thickening/muscular hypertrophy limits evaluation for detection of underlying mass. Aortic Atherosclerosis (ICD10-I70.0). These results will be called to the ordering clinician or representative by the Radiologist Assistant, and communication documented in the PACS or zVision Dashboard. Electronically Signed   By: Lacy DuverneySteven  Olson M.D.   On: 05/30/2017 16:45    Assessment & Plan:   Kathryn LagerYolanda was seen today for rash.  Diagnoses and all orders for this visit:  Allergic drug rash due to anti-infective agent -     hydrOXYzine (ATARAX/VISTARIL) 25 MG tablet; Take 1 tablet (25 mg total) by mouth every 4 (four) hours as needed. -     methylPREDNISolone acetate (DEPO-MEDROL) injection 120 mg  Allergic reaction to drug, initial encounter- She is having significant allergic reaction with severe symptoms.  I decided to treat her with an injection of methylprednisolone and will offer hydroxyzine as  needed for the itching.  She has not taken amoxicillin for nearly 5 days now.  She was advised that she is allergic to it and to never take anything like this again. -     hydrOXYzine (ATARAX/VISTARIL) 25 MG tablet; Take 1 tablet (25 mg total) by mouth every 4 (four) hours as needed. -     methylPREDNISolone acetate (DEPO-MEDROL) injection 120 mg  Other orders -     sertraline (ZOLOFT) 50 MG tablet; Take 1 tablet (50 mg total) by mouth daily.   I have discontinued Carrina A. Riolo's  ciprofloxacin, amoxicillin, and triamcinolone cream. I am also having her start on hydrOXYzine. Additionally, I am having her maintain her Omeprazole (PRILOSEC PO) and sertraline. We administered methylPREDNISolone acetate.  Meds ordered this encounter  Medications  . hydrOXYzine (ATARAX/VISTARIL) 25 MG tablet    Sig: Take 1 tablet (25 mg total) by mouth every 4 (four) hours as needed.    Dispense:  45 tablet    Refill:  0  . methylPREDNISolone acetate (DEPO-MEDROL) injection 120 mg  . sertraline (ZOLOFT) 50 MG tablet    Sig: Take 1 tablet (50 mg total) by mouth daily.    Dispense:  90 tablet    Refill:  0     Follow-up: Return if symptoms worsen or fail to improve.  Sanda Linger, MD

## 2017-06-01 MED ORDER — SERTRALINE HCL 50 MG PO TABS: 50.0000 mg | ORAL_TABLET | Freq: Every day | ORAL | 0 refills | Status: DC

## 2017-06-03 ENCOUNTER — Ambulatory Visit: Payer: Self-pay | Admitting: Hematology

## 2017-06-03 ENCOUNTER — Ambulatory Visit: Payer: BLUE CROSS/BLUE SHIELD | Admitting: Nurse Practitioner

## 2017-06-03 ENCOUNTER — Other Ambulatory Visit: Payer: Self-pay | Admitting: Internal Medicine

## 2017-06-03 DIAGNOSIS — T7840XD Allergy, unspecified, subsequent encounter: Secondary | ICD-10-CM

## 2017-06-03 MED ORDER — CLOBETASOL PROPIONATE 0.05 % EX OINT: 1.0000 "application " | TOPICAL_OINTMENT | Freq: Two times a day (BID) | CUTANEOUS | 1 refills | Status: DC

## 2017-06-03 NOTE — Telephone Encounter (Signed)
RX sent

## 2017-06-03 NOTE — Telephone Encounter (Signed)
Pt informed of same.  

## 2017-06-03 NOTE — Telephone Encounter (Signed)
Patient was on amoxicillin and completed treatment on 05-26-17. Developed a rash on 05/27/17, and was in to see Dr. Yetta BarreJones on 05/31/17.  She was given a prednisone shot on that day, and improved a little.  On Thursday 06/02/17 she developed a rash around her sides, back and onto her buttocks.  Moderate itching.  Patient states she did take some OTC cough syrup on Wednesday.  Patient states she has taken benadryl without improvement.  Per patient Dr. Yetta BarreJones mention some "cream" he could prescribe it needed.  Home care instructions provided.  Reason for Disposition . Mild localized itching  Answer Assessment - Initial Assessment Questions 1. DESCRIPTION: "Describe the itching you are having." "Where is it located?"     Side, butt,  2. SEVERITY: "How bad is it?"    - MILD - doesn't interfere with normal activities   - MODERATE-SEVERE: interferes with work, school, sleep, or other activities      mild 3. SCRATCHING: "Are there any scratch marks? Bleeding?"    NO 4. ONSET: "When did the itching begin?"      06/02/17 5. CAUSE: "What do you think is causing the itching?"     unsure 6. OTHER SYMPTOMS: "Do you have any other symptoms?"     no  Protocols used: ITCHING - LOCALIZED-A-AH

## 2017-06-03 NOTE — Telephone Encounter (Signed)
Pt is requesting a cream or ointment for her rash, seen on 05/31/2017 for acute issue.

## 2017-06-04 ENCOUNTER — Encounter: Payer: Self-pay | Admitting: Family

## 2017-06-04 ENCOUNTER — Ambulatory Visit: Payer: BLUE CROSS/BLUE SHIELD | Admitting: Family

## 2017-06-04 VITALS — BP 126/80 | HR 87 | Temp 98.1°F | Ht 66.5 in | Wt 146.4 lb

## 2017-06-04 DIAGNOSIS — L270 Generalized skin eruption due to drugs and medicaments taken internally: Secondary | ICD-10-CM | POA: Diagnosis not present

## 2017-06-04 MED ORDER — METHYLPREDNISOLONE 4 MG PO TBPK: ORAL_TABLET | ORAL | 0 refills | Status: DC

## 2017-06-04 MED ORDER — METHYLPREDNISOLONE ACETATE 80 MG/ML IJ SUSP
80.0000 mg | Freq: Once | INTRAMUSCULAR | Status: AC
  Administered 2017-06-04: 80 mg via INTRAMUSCULAR

## 2017-06-04 MED ORDER — METHYLPREDNISOLONE ACETATE 40 MG/ML IJ SUSP
40.0000 mg | Freq: Once | INTRAMUSCULAR | Status: AC
  Administered 2017-06-04: 40 mg via INTRAMUSCULAR

## 2017-06-04 NOTE — Patient Instructions (Addendum)
Begin medrol dose pak this evening. You may add a daily antihistamine such as zyrtec once daily.  You can stop the clobetasol cream and atarax.  For breakthrough itching you may use benadryl 25mg  once daily. Call if new/worsening symptoms or if not improved in 2-3 days. Go to ER if you develop tongue/lip swelling.

## 2017-06-04 NOTE — Progress Notes (Signed)
Subjective:    Patient ID: Kathryn [name], female    DOB: 03/23/1956, 61 y.o.   MRN: [id number]  HPI  Kathryn [name] is a 61 yr old female who presents toady with chief complaint of rash. Rash began 8 days ago at the end of an amoxicillin course. Initially was seen on 05/28/17 and was prescribed clobetasol cream.  This did not help. She then saw Dr. Yetta BarreJones on 05/31/17.  She was treated with IM depomedrol, atarax prn. Noted brief improvement following depomedrol but then symptoms returned.  Using atarax but notes that it only "takes the edge off" and thinks that this is making her irritable.  Has intense itching. Denies tongue or lip swelling.    Review of Systems See HPI  Past Medical History:  Diagnosis Date  . Anxiety   . Diverticulitis 2016  . Fracture of fifth metacarpal bone of left hand 09/03/2011   post fall  . GERD (gastroesophageal reflux disease)   . Osteoarthritis      Social History   Socioeconomic History  . Marital status: Married    Spouse name: Not on file  . Number of children: 2  . Years of education: Not on file  . Highest education level: Not on file  Social Needs  . Financial resource strain: Not on file  . Food insecurity - worry: Not on file  . Food insecurity - inability: Not on file  . Transportation needs - medical: Not on file  . Transportation needs - non-medical: Not on file  Occupational History  . Occupation: Stay at home mom  Tobacco Use  . Smoking status: Never Smoker  . Smokeless tobacco: Never Used  Substance and Sexual Activity  . Alcohol use: No    Alcohol/week: 0.0 oz  . Drug use: No  . Sexual activity: Not on file  Other Topics Concern  . Not on file  Social History Narrative  . Not on file    Past Surgical History:  Procedure Laterality Date  . APPENDECTOMY    . COLONOSCOPY     negative; Dr Jarold MottoPatterson  . ORIF FINGER FRACTURE  09/03/2011   Procedure: OPEN REDUCTION INTERNAL FIXATION (ORIF) METACARPAL (FINGER) FRACTURE;  Surgeon:  Eulas PostJoshua P Landau, MD;  Location: [facility];  Service: Orthopedics;  Laterality: Left;  . TUBAL LIGATION      Family History  Problem Relation Age of Onset  . Colitis Mother   . COPD Mother   . Heart attack Mother [id number]  . Heart disease Maternal Grandfather   . Breast cancer Unknown        MGaunt  . Diabetes Neg Hx   . Stroke Neg Hx   . Colon cancer Neg Hx   . Colon polyps Neg Hx   . Kidney disease Neg Hx   . Gallbladder disease Neg Hx   . Esophageal cancer Neg Hx     Allergies  Allergen Reactions  . Amoxil [Amoxicillin] Itching  . Penicillins Rash    ? Rash as child    Current Outpatient Medications on File Prior to Visit  Medication Sig Dispense Refill  . clobetasol ointment (TEMOVATE) 0.05 % Apply 1 application topically 2 (two) times daily. 60 g 1  . hydrOXYzine (ATARAX/VISTARIL) 25 MG tablet Take 1 tablet (25 mg total) by mouth every 4 (four) hours as needed. 45 tablet 0  . hydrOXYzine (VISTARIL) 25 MG capsule take 1 capsule by mouth every 4 hours if needed  0  . Omeprazole (PRILOSEC  PO) Prilosec    . sertraline (ZOLOFT) 50 MG tablet Take 1 tablet (50 mg total) by mouth daily. 90 tablet 0   No current facility-administered medications on file prior to visit.     BP 126/80 (BP Location: Left Arm, Patient Position: Sitting, Cuff Size: Normal)   Pulse 87   Temp 98.1 F (36.7 C) (Oral)   Ht 5' 6.5" (1.689 m)   Wt 146 lb 6.1 oz (66.4 kg)   SpO2 99%   BMI 23.27 kg/m       Objective:   Physical Exam  Constitutional: She is oriented to person, place, and time. She appears well-developed and well-nourished.  HENT:  Head: Normocephalic and atraumatic.  Cardiovascular: Normal rate, regular rhythm and normal heart sounds.  No murmur heard. Pulmonary/Chest: Effort normal and breath sounds normal. No respiratory distress. She has no wheezes.  Musculoskeletal: She exhibits no edema.  Neurological: She is alert and oriented to person, place, and time.    Skin:  diffuse rash noted on trunk, arms, legs  Psychiatric: She has a normal mood and affect. Her behavior is normal. Judgment and thought content normal.          Assessment & Plan:  Drug rash- advised pt as follows:   Begin medrol dose pak this evening. You may add a daily antihistamine such as zyrtec once daily.  You can stop the clobetasol cream and atarax.  For breakthrough itching you may use benadryl 25mg  once daily. Call if new/worsening symptoms or if not improved in 2-3 days. Go to ER if you develop tongue/lip swelling.

## 2017-06-30 ENCOUNTER — Ambulatory Visit: Payer: BLUE CROSS/BLUE SHIELD | Admitting: Internal Medicine

## 2017-06-30 ENCOUNTER — Other Ambulatory Visit (INDEPENDENT_AMBULATORY_CARE_PROVIDER_SITE_OTHER): Payer: BLUE CROSS/BLUE SHIELD

## 2017-06-30 ENCOUNTER — Encounter: Payer: Self-pay | Admitting: Internal Medicine

## 2017-06-30 VITALS — BP 120/78 | HR 81 | Temp 98.1°F | Ht 66.5 in | Wt 144.8 lb

## 2017-06-30 DIAGNOSIS — Z1159 Encounter for screening for other viral diseases: Secondary | ICD-10-CM

## 2017-06-30 DIAGNOSIS — K219 Gastro-esophageal reflux disease without esophagitis: Secondary | ICD-10-CM

## 2017-06-30 DIAGNOSIS — R739 Hyperglycemia, unspecified: Secondary | ICD-10-CM

## 2017-06-30 DIAGNOSIS — R7989 Other specified abnormal findings of blood chemistry: Secondary | ICD-10-CM | POA: Diagnosis not present

## 2017-06-30 DIAGNOSIS — Z0000 Encounter for general adult medical examination without abnormal findings: Secondary | ICD-10-CM

## 2017-06-30 DIAGNOSIS — Z1211 Encounter for screening for malignant neoplasm of colon: Secondary | ICD-10-CM

## 2017-06-30 DIAGNOSIS — Z23 Encounter for immunization: Secondary | ICD-10-CM

## 2017-06-30 LAB — LIPID PANEL
Cholesterol: 194 mg/dL (ref 0–200)
HDL: 49.1 mg/dL (ref >39.00)
LDL CALC: 109 mg/dL — AB (ref 0–99)
NONHDL: 144.8
Total CHOL/HDL Ratio: 4
Triglycerides: 179 mg/dL — ABNORMAL HIGH (ref 0.0–149.0)
VLDL: 35.8 mg/dL (ref 0.0–40.0)

## 2017-06-30 LAB — CBC WITH DIFFERENTIAL/PLATELET
Basophils Absolute: 0 10*3/uL (ref 0.0–0.1)
Basophils Relative: 0.1 % (ref 0.0–3.0)
EOS ABS: 0 10*3/uL (ref 0.0–0.7)
Eosinophils Relative: 0.5 % (ref 0.0–5.0)
HCT: 38.4 % (ref 36.0–46.0)
HEMOGLOBIN: 12.8 g/dL (ref 12.0–15.0)
Lymphocytes Relative: 27.6 % (ref 12.0–46.0)
Lymphs Abs: 1.5 10*3/uL (ref 0.7–4.0)
MCHC: 33.5 g/dL (ref 30.0–36.0)
MCV: 91.8 fl (ref 78.0–100.0)
Monocytes Absolute: 0.4 10*3/uL (ref 0.1–1.0)
Monocytes Relative: 6.6 % (ref 3.0–12.0)
Neutro Abs: 3.5 10*3/uL (ref 1.4–7.7)
Neutrophils Relative %: 65.2 % (ref 43.0–77.0)
Platelets: 193 10*3/uL (ref 150.0–400.0)
RBC: 4.18 Mil/uL (ref 3.87–5.11)
RDW: 13.9 % (ref 11.5–15.5)
WBC: 5.4 10*3/uL (ref 4.0–10.5)

## 2017-06-30 LAB — COMPREHENSIVE METABOLIC PANEL
ALBUMIN: 4.3 g/dL (ref 3.5–5.2)
ALK PHOS: 71 U/L (ref 39–117)
ALT: 13 U/L (ref 0–35)
AST: 17 U/L (ref 0–37)
BUN: 12 mg/dL (ref 6–23)
CHLORIDE: 102 meq/L (ref 96–112)
CO2: 32 mEq/L (ref 19–32)
CREATININE: 0.79 mg/dL (ref 0.40–1.20)
Calcium: 9.3 mg/dL (ref 8.4–10.5)
GFR: 78.97 mL/min (ref >60.00)
Glucose, Bld: 82 mg/dL (ref 70–99)
Potassium: 4.3 mEq/L (ref 3.5–5.1)
SODIUM: 141 meq/L (ref 135–145)
TOTAL PROTEIN: 7.1 g/dL (ref 6.0–8.3)
Total Bilirubin: 0.5 mg/dL (ref 0.2–1.2)

## 2017-06-30 LAB — HEMOGLOBIN A1C: HEMOGLOBIN A1C: 5.8 % (ref 4.6–6.5)

## 2017-06-30 MED ORDER — ZOSTER VAC RECOMB ADJUVANTED 50 MCG/0.5ML IM SUSR: 0.5000 mL | Freq: Once | INTRAMUSCULAR | 1 refills | Status: AC

## 2017-06-30 NOTE — Progress Notes (Signed)
Subjective:  Patient ID: Kathryn [name], female    DOB: Nov 04, 1955  Age: 62 y.o. MRN: [id number]  CC: Annual Exam and Hyperlipidemia   HPI Kathryn [name] presents for a CPX.  She feels well today and offers no complaints.  Her GERD symptoms are well controlled with the PPI.  Her mood is stable on sertraline.  Outpatient Medications Prior to Visit  Medication Sig Dispense Refill  . Omeprazole (PRILOSEC PO) Prilosec    . sertraline (ZOLOFT) 50 MG tablet Take 1 tablet (50 mg total) by mouth daily. 90 tablet 0  . hydrOXYzine (ATARAX/VISTARIL) 25 MG tablet Take 1 tablet (25 mg total) by mouth every 4 (four) hours as needed. 45 tablet 0  . methylPREDNISolone (MEDROL DOSEPAK) 4 MG TBPK tablet Take as directed 21 tablet 0   No facility-administered medications prior to visit.     ROS Review of Systems  Constitutional: Negative.  Negative for appetite change, diaphoresis, fatigue and unexpected weight change.  HENT: Negative.  Negative for trouble swallowing.   Eyes: Negative for visual disturbance.  Respiratory: Negative for cough, chest tightness, shortness of breath and wheezing.   Cardiovascular: Negative for chest pain, palpitations and leg swelling.  Gastrointestinal: Negative for abdominal pain, constipation, diarrhea, nausea and vomiting.  Endocrine: Negative.  Negative for cold intolerance and heat intolerance.  Genitourinary: Negative.  Negative for difficulty urinating.  Musculoskeletal: Negative.  Negative for arthralgias, back pain, myalgias and neck pain.  Skin: Negative.  Negative for color change and rash.  Allergic/Immunologic: Negative.   Neurological: Negative.  Negative for dizziness.  Hematological: Negative for adenopathy. Does not bruise/bleed easily.  Psychiatric/Behavioral: Negative.     Objective:  BP 120/78 (BP Location: Left Arm, Patient Position: Sitting, Cuff Size: Normal)   Pulse 81   Temp 98.1 F (36.7 C) (Oral)   Ht 5' 6.5" (1.689 m)   Wt 144 lb  12 oz (65.7 kg)   SpO2 98%   BMI 23.01 kg/m   BP Readings from Last 3 Encounters:  06/30/17 120/78  06/04/17 126/80  05/31/17 128/90    Wt Readings from Last 3 Encounters:  06/30/17 144 lb 12 oz (65.7 kg)  06/04/17 146 lb 6.1 oz (66.4 kg)  05/31/17 148 lb (67.1 kg)    Physical Exam  Constitutional: She is oriented to person, place, and time. No distress.  HENT:  Mouth/Throat: Oropharynx is clear and moist. No oropharyngeal exudate.  Eyes: Conjunctivae are normal. Left eye exhibits no discharge. No scleral icterus.  Neck: Normal range of motion. Neck supple. No JVD present. No thyromegaly present.  Cardiovascular: Normal rate, regular rhythm and normal heart sounds.  No murmur heard. Pulmonary/Chest: Effort normal and breath sounds normal. She has no wheezes. She has no rales.  Abdominal: Soft. Bowel sounds are normal. She exhibits no distension and no mass. There is no tenderness.  Musculoskeletal: Normal range of motion. She exhibits no edema, tenderness or deformity.  Lymphadenopathy:    She has no cervical adenopathy.  Neurological: She is alert and oriented to person, place, and time.  Skin: Skin is warm and dry. No rash noted. She is not diaphoretic. No erythema. No pallor.  Psychiatric: She has a normal mood and affect. Her behavior is normal. Judgment and thought content normal.  Vitals reviewed.   Lab Results  Component Value Date   WBC 5.4 06/30/2017   HGB 12.8 06/30/2017   HCT 38.4 06/30/2017   PLT 193.0 06/30/2017   GLUCOSE 82 06/30/2017   CHOL  194 06/30/2017   TRIG 179.0 (H) 06/30/2017   HDL 49.10 06/30/2017   LDLDIRECT 130.8 01/08/2013   LDLCALC 109 (H) 06/30/2017   ALT 13 06/30/2017   AST 17 06/30/2017   NA 141 06/30/2017   K 4.3 06/30/2017   CL 102 06/30/2017   CREATININE 0.79 06/30/2017   BUN 12 06/30/2017   CO2 32 06/30/2017   TSH 3.95 06/30/2017   INR 1.0 09/10/2015   HGBA1C 5.8 06/30/2017    Ct Abdomen Pelvis W Contrast  Result Date:  05/30/2017 CLINICAL DATA:  62 year old female with left [name] quadrant abdominal tenderness for 3 weeks. Antibiotics with some relief. Prior appendectomy. Initial encounter. EXAM: CT ABDOMEN AND PELVIS WITH CONTRAST TECHNIQUE: Multidetector CT imaging of the abdomen and pelvis was performed using the standard protocol following bolus administration of intravenous contrast. CONTRAST:  100mL ISOVUE-300 IOPAMIDOL (ISOVUE-300) INJECTION 61% COMPARISON:  None. FINDINGS: [name] chest: Minimal scarring lung bases. Heart size within normal limits. Hepatobiliary: Left lobe liver 1.2 cm cyst. Focal fatty infiltration adjacent to the fissure for the falciform ligament. No worrisome hepatic lesion. No calcified gallstones. Pancreas: No pancreatic mass or inflammation. Spleen: No splenic mass or enlargement. Adrenals/Urinary Tract: No obstructing stone or hydronephrosis. No worrisome renal or adrenal lesion. Noncontrast filled imaging of the urinary bladder without gross abnormality. Stomach/Bowel: Prominent diverticula most notable descending colon and sigmoid colon with associated muscular hypertrophy. Additionally, subtle hazy infiltration of fat planes surrounding distal descending colon and circumferential thickening proximal sigmoid colon consistent with changes of mild diverticulitis without drainable abscess or free intraperitoneal air. The bowel wall thickening limits evaluation for detection of underlying mass. Under distended stomach contains ingested material. No primary small bowel abnormality noted. Vascular/Lymphatic: Mild aortic calcified plaque without aneurysm or large vessel occlusion. No adenopathy. Reproductive: No worrisome uterine or adnexal abnormality Other: No bowel containing hernia. Musculoskeletal: Curvature lumbar spine convex left. Lumbar degenerative changes most notable right L4-5 facet joint. No osseous destructive lesion. IMPRESSION: Mild descending colon/proximal sigmoid colon diverticulitis  without drainable abscess or free intraperitoneal air. The bowel wall thickening/muscular hypertrophy limits evaluation for detection of underlying mass. Aortic Atherosclerosis (ICD10-I70.0). These results will be called to the ordering clinician or representative by the Radiologist Assistant, and communication documented in the PACS or zVision Dashboard. Electronically Signed   By: Lacy DuverneySteven  Olson M.D.   On: 05/30/2017 16:45    Assessment & Plan:   Kathryn [name] was seen today for annual exam and hyperlipidemia.  Diagnoses and all orders for this visit:  Gastroesophageal reflux disease without esophagitis- Her symptoms are well controlled and there are no alarm features.  Will continue the PPI. -     CBC with Differential/Platelet; Future  Elevated TSH- She has a mildly elevated TSH but her other TFTs are normal and clinically she is euthyroid.  Will continue to monitor for symptoms of hypothyroidism. -     Comprehensive metabolic panel; Future -     Thyroid Panel With TSH; Future  Routine general medical examination at a health care facility- Exam completed, labs reviewed, vaccines reviewed and addressed, will screen for colon cancer/polyps with cologuard, she will have her mammogram and bone density done at [facility], Pap smear is up-to-date, patient education material was given. -     Lipid panel; Future -     HIV antibody; Future  Need for hepatitis C screening test -     Hepatitis C antibody; Future  Need for shingles vaccine -     Zoster Vaccine Adjuvanted [facility](SHINGRIX) injection;  Inject 0.5 mLs into the muscle once for 1 dose.  Hyperglycemia- Her A1c is very mildly elevated at 5.8%.  She is prediabetic.  Medical therapy is not indicated. -     Comprehensive metabolic panel; Future -     Hemoglobin A1c; Future  Screen for colon cancer -     Cologuard   I have discontinued Keymani A. Dowse's hydrOXYzine and methylPREDNISolone. I am also having her start on Zoster Vaccine Adjuvanted.  Additionally, I am having her maintain her Omeprazole (PRILOSEC PO) and sertraline.  Meds ordered this encounter  Medications  . Zoster Vaccine Adjuvanted [facility]) injection    Sig: Inject 0.5 mLs into the muscle once for 1 dose.    Dispense:  0.5 mL    Refill:  1     Follow-up: Return if symptoms worsen or fail to improve.  Sanda Linger, MD

## 2017-06-30 NOTE — Patient Instructions (Signed)

## 2017-07-01 ENCOUNTER — Encounter: Payer: Self-pay | Admitting: Internal Medicine

## 2017-07-01 LAB — HEPATITIS C ANTIBODY
HEP C AB: NONREACTIVE
SIGNAL TO CUT-OFF: 0.05 (ref <1.00)

## 2017-07-01 LAB — HIV ANTIBODY (ROUTINE TESTING W REFLEX): HIV: NONREACTIVE

## 2017-07-01 LAB — THYROID PANEL WITH TSH
Free Thyroxine Index: 1.8 (ref 1.4–3.8)
T3 Uptake: 32 % (ref 22–35)
T4, Total: 5.7 ug/dL (ref 5.1–11.9)
TSH: 3.95 mIU/L (ref 0.40–4.50)

## 2017-07-08 LAB — HM MAMMOGRAPHY

## 2017-07-12 ENCOUNTER — Encounter: Payer: Self-pay | Admitting: Internal Medicine

## 2017-07-12 NOTE — Progress Notes (Signed)
Outside notes received. Information abstracted. Notes sent to scan.  

## 2017-07-13 LAB — HM PAP SMEAR

## 2017-07-15 LAB — COLOGUARD: Cologuard: NEGATIVE

## 2017-07-29 ENCOUNTER — Encounter: Payer: Self-pay | Admitting: Internal Medicine

## 2017-07-29 NOTE — Progress Notes (Signed)
Result Abstracted and sent to scan. 

## 2017-08-31 ENCOUNTER — Telehealth: Payer: Self-pay | Admitting: Internal Medicine

## 2017-08-31 NOTE — Telephone Encounter (Signed)
Copied from CRM [id number]. Topic: Quick Communication - See Telephone Encounter >> Aug 31, 2017 10:34 AM Guinevere FerrariMorris, Jazmine Longshore E, NT wrote: CRM for notification. See Telephone encounter for: Patient is calling to see if she can get a refill for sertraline (ZOLOFT) 50 MG tablet. Also patient asked if the doctor could go up on the dosage. [facility] [address] [id number] - [place], [place] - [phone] (Phone) [phone] (Fax)    08/31/17.

## 2017-09-01 ENCOUNTER — Other Ambulatory Visit: Payer: Self-pay | Admitting: Internal Medicine

## 2017-09-01 DIAGNOSIS — F419 Anxiety disorder, unspecified: Secondary | ICD-10-CM

## 2017-09-01 MED ORDER — SERTRALINE HCL 100 MG PO TABS: 100.0000 mg | ORAL_TABLET | Freq: Every day | ORAL | 1 refills | Status: DC

## 2017-09-01 NOTE — Telephone Encounter (Signed)
RX sent

## 2017-09-01 NOTE — Telephone Encounter (Signed)
Notified pt MD increase dosage new rx has been sent pof.Marland Kitchen.[name]/lmb

## 2018-02-28 ENCOUNTER — Other Ambulatory Visit: Payer: Self-pay | Admitting: Internal Medicine

## 2018-02-28 DIAGNOSIS — F419 Anxiety disorder, unspecified: Secondary | ICD-10-CM

## 2018-05-31 ENCOUNTER — Other Ambulatory Visit: Payer: Self-pay | Admitting: Internal Medicine

## 2018-05-31 DIAGNOSIS — F419 Anxiety disorder, unspecified: Secondary | ICD-10-CM

## 2018-06-16 ENCOUNTER — Encounter: Payer: Self-pay | Admitting: Nurse Practitioner

## 2018-06-16 ENCOUNTER — Ambulatory Visit: Payer: BLUE CROSS/BLUE SHIELD | Admitting: Nurse Practitioner

## 2018-06-16 VITALS — BP 128/80 | HR 94 | Temp 99.0°F | Ht 66.5 in | Wt 142.0 lb

## 2018-06-16 DIAGNOSIS — J40 Bronchitis, not specified as acute or chronic: Secondary | ICD-10-CM

## 2018-06-16 MED ORDER — AZITHROMYCIN 250 MG PO TABS: ORAL_TABLET | ORAL | 0 refills | Status: DC

## 2018-06-16 MED ORDER — FLUTICASONE PROPIONATE 50 MCG/ACT NA SUSP: 2.0000 | Freq: Every day | NASAL | 1 refills | Status: DC

## 2018-06-16 NOTE — Progress Notes (Signed)
Kathryn LowerYolanda A [name] is a 62 y.o. female with the following history as recorded in EpicCare:  Patient Active Problem List   Diagnosis Date Noted  . Routine general medical examination at a health care facility 06/30/2017  . Elevated TSH 12/06/2014  . Hyperlipidemia 12/06/2014  . Subacromial bursitis 11/02/2013  . Chronic venous insufficiency 08/19/2013  . GERD 11/02/2007  . DEGENERATIVE JOINT DISEASE 11/02/2007  . Anxiety 10/31/2007    Current Outpatient Medications  Medication Sig Dispense Refill  . Omeprazole (PRILOSEC PO) Prilosec    . sertraline (ZOLOFT) 100 MG tablet TAKE 1 TABLET BY MOUTH ONCE DAILY 30 tablet 0  . azithromycin (ZITHROMAX) 250 MG tablet Take 2 tablets today then 1 tablet daily until done 6 tablet 0  . fluticasone (FLONASE) 50 MCG/ACT nasal spray Place 2 sprays into both nostrils daily. 16 g 1   No current facility-administered medications for this visit.     Allergies: Amoxil [amoxicillin] and Penicillins  Past Medical History:  Diagnosis Date  . Anxiety   . Diverticulitis 2016  . Fracture of fifth metacarpal bone of left hand 09/03/2011   post fall  . GERD (gastroesophageal reflux disease)   . Osteoarthritis     Past Surgical History:  Procedure Laterality Date  . APPENDECTOMY    . COLONOSCOPY     negative; Dr Jarold MottoPatterson  . ORIF FINGER FRACTURE  09/03/2011   Procedure: OPEN REDUCTION INTERNAL FIXATION (ORIF) METACARPAL (FINGER) FRACTURE;  Surgeon: Eulas PostJoshua P Landau, MD;  Location:  [facility];  Service: Orthopedics;  Laterality: Left;  . TUBAL LIGATION      Family History  Problem Relation Age of Onset  . Colitis Mother   . COPD Mother   . Heart attack Mother 165  . Heart disease Maternal Grandfather   . Breast cancer Unknown        MGaunt  . Diabetes Neg Hx   . Stroke Neg Hx   . Colon cancer Neg Hx   . Colon polyps Neg Hx   . Kidney disease Neg Hx   . Gallbladder disease Neg Hx   . Esophageal cancer Neg Hx     Social History    Tobacco Use  . Smoking status: Never Smoker  . Smokeless tobacco: Never Used  Substance Use Topics  . Alcohol use: No    Alcohol/week: 0.0 standard drinks     Subjective:  Kathryn [name] is here today requesting evaluation of acute complaint of cough/cold symptoms, which first began about 1 week ago, she first noticed sore throat, then sinus pain and pressure, nasal and chest congestion, body aches, dry cough. Symptoms have not improved since onset, she is feeling even worse today Reports: fevers, chills,  Denies: chest pain, sob, abd pain, n/v/d Smoker? No Tried at home: OTC cold and sinus, mucinex with minimal relief  ROS - See HPI  Objective:  Vitals:   06/16/18 1329  BP: 128/80  Pulse: 94  Temp: 99 F (37.2 C)  TempSrc: Oral  SpO2: 98%  Weight: 142 lb (64.4 kg)  Height: 5' 6.5" (1.689 m)    General: Well developed, well nourished, in no acute distress  Skin : Warm and dry.  Head: Normocephalic and atraumatic  Eyes: Sclera and conjunctiva clear; pupils round and reactive to light; extraocular movements intact  Ears: External normal; canals clear; tympanic membranes bulging, without erythema or effusion Oropharynx: posterior oropharyngeal erythema, no edema or exudate. No suspicious lesions  Neck: Supple without thyromegaly, adenopathy  Lungs: Respirations unlabored; clear  to auscultation bilaterally without wheeze, rales, rhonchi  CVS exam: normal rate and regular rhythm, S1 and S2 normal.  Extremities: No edema, cyanosis Vessels: Symmetric bilaterally  Neurologic: Alert and oriented; speech intact; face symmetrical; moves all extremities well; CNII-XII intact without focal deficit  Psychiatric: Normal mood and affect.   Assessment:  1. Bronchitis     Plan:   Due to duration of symptoms, no improvement, will start antibiotic course-medication dosing and side effects discussed Flonase Rx sent for symptoms-dosing, side effects discussed Home management, red flags  and return precautions including when to seek immediate care discussed and printed on AVS She was instructed to f/u for new, worsening symptoms or if no improvement of the antibiotics   No follow-ups on file.  No orders of the defined types were placed in this encounter.   Requested Prescriptions   Signed Prescriptions Disp Refills  . azithromycin (ZITHROMAX) 250 MG tablet 6 tablet 0    Sig: Take 2 tablets today then 1 tablet daily until done  . fluticasone (FLONASE) 50 MCG/ACT nasal spray 16 g 1    Sig: Place 2 sprays into both nostrils daily.

## 2018-06-16 NOTE — Patient Instructions (Addendum)
Take z-pak as directed  I have sent a prescription for flonase nasal spray to your pharmacy- you may start with 2 sprays in each nostril daily then reduce to 1 spray in each nostril daily when your symptoms improve You may also take an over the counter allergy medication such as claritin or zyrtec for your symptoms. Honey to soothe your throat  Please follow up for fevers over 101, if your symptoms get worse, or if your symptoms dont get better with the antibiotic.    Acute Bronchitis, Adult Acute bronchitis is when air tubes (bronchi) in the lungs suddenly get swollen. The condition can make it hard to breathe. It can also cause these symptoms:  A cough.  Coughing up clear, yellow, or green mucus.  Wheezing.  Chest congestion.  Shortness of breath.  A fever.  Body aches.  Chills.  A sore throat. Follow these instructions at home:  Medicines  Take over-the-counter and prescription medicines only as told by your doctor.  If you were prescribed an antibiotic medicine, take it as told by your doctor. Do not stop taking the antibiotic even if you start to feel better. General instructions  Rest.  Drink enough fluids to keep your pee (urine) pale yellow.  Avoid smoking and secondhand smoke. If you smoke and you need help quitting, ask your doctor. Quitting will help your lungs heal faster.  Use an inhaler, cool mist vaporizer, or humidifier as told by your doctor.  Keep all follow-up visits as told by your doctor. This is important. How is this prevented? To lower your risk of getting this condition again:  Wash your hands often with soap and water. If you cannot use soap and water, use hand sanitizer.  Avoid contact with people who have cold symptoms.  Try not to touch your hands to your mouth, nose, or eyes.  Make sure to get the flu shot every year. Contact a doctor if:  Your symptoms do not get better in 2 weeks. Get help right away if:  You cough up  blood.  You have chest pain.  You have very bad shortness of breath.  You become dehydrated.  You faint (pass out) or keep feeling like you are going to pass out.  You keep throwing up (vomiting).  You have a very bad headache.  Your fever or chills gets worse. This information is not intended to replace advice given to you by your health care provider. Make sure you discuss any questions you have with your health care provider. Document Released: 12/01/2007 Document Revised: 01/26/2017 Document Reviewed: 12/03/2015 Elsevier Interactive Patient Education  2019 [employer]

## 2018-07-05 ENCOUNTER — Other Ambulatory Visit: Payer: Self-pay | Admitting: Internal Medicine

## 2018-07-05 DIAGNOSIS — F419 Anxiety disorder, unspecified: Secondary | ICD-10-CM

## 2018-07-31 IMAGING — CT CT ABD-PELV W/ CM
2 of 5 series · 16 of 46 positions shown, 18 images · IV contrast (ISOVUE 300)
Comparison: None.

CLINICAL DATA: 61-year-old female with left lower quadrant
abdominal tenderness for 3 weeks. Antibiotics with some relief.
Prior appendectomy. Initial encounter.

EXAM:
CT ABDOMEN AND PELVIS WITH CONTRAST
TECHNIQUE: Multidetector CT imaging of the abdomen and pelvis was performed
using the standard protocol following bolus administration of
intravenous contrast.
CONTRAST:  100mL 9OMWCX-9UU IOPAMIDOL (9OMWCX-9UU) INJECTION 61%

[Series 2: abd/pel w · axial · 0.68mm/px · z∈[-345,-15]mm · 13 of 76 slices shown, 15 images]
[im 5/76  soft-tissue]
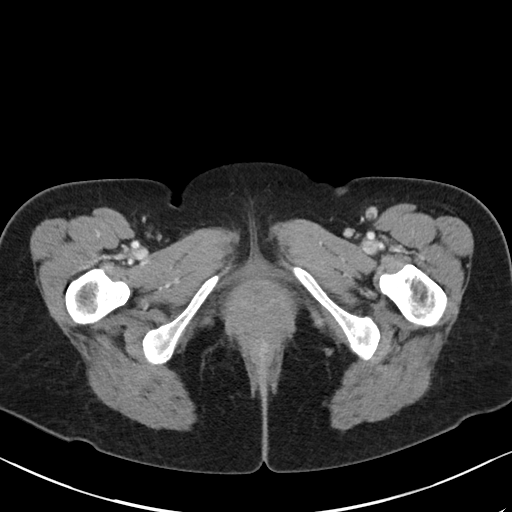
[im 5/76  bone]
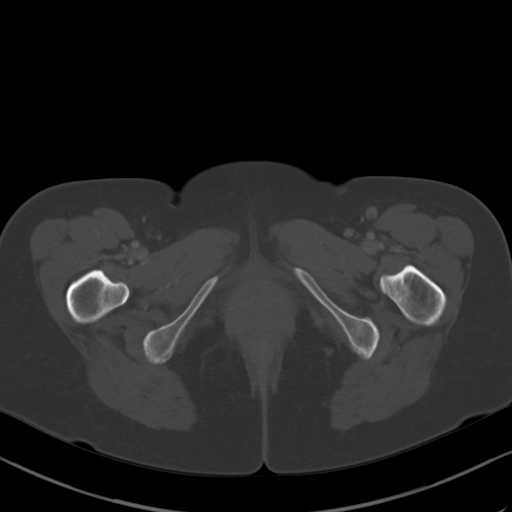
[im 9/76  soft-tissue]
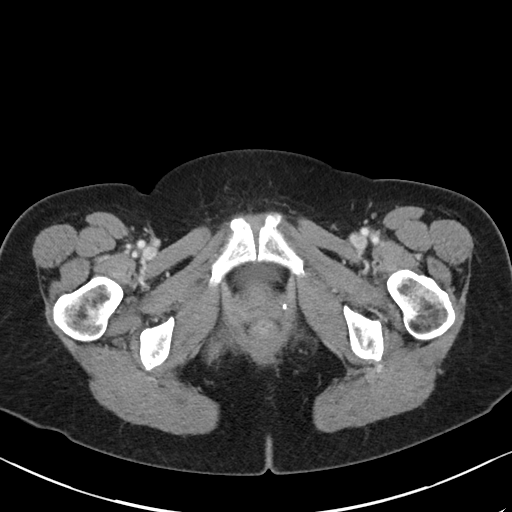
[im 17/76  soft-tissue]
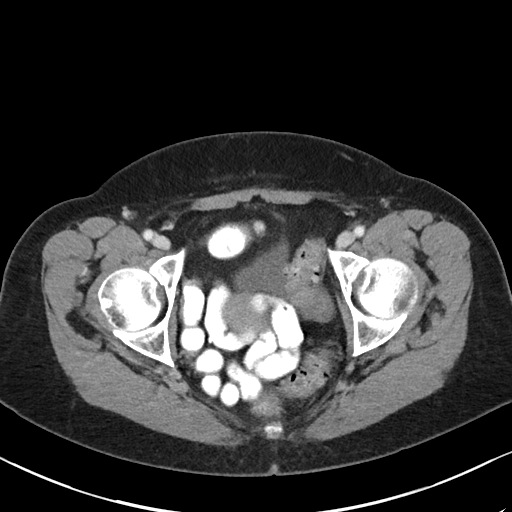
[im 21/76  soft-tissue]
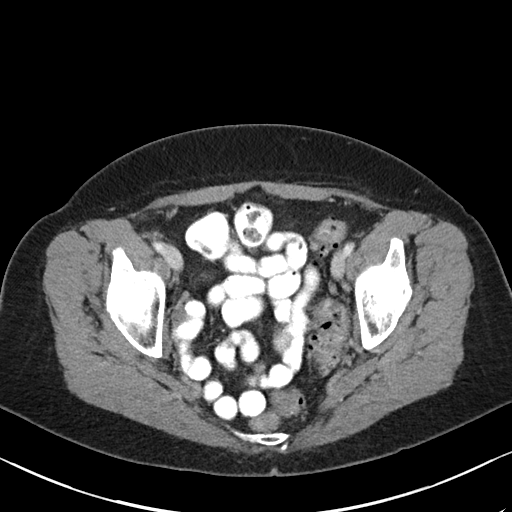
[im 26/76  soft-tissue]
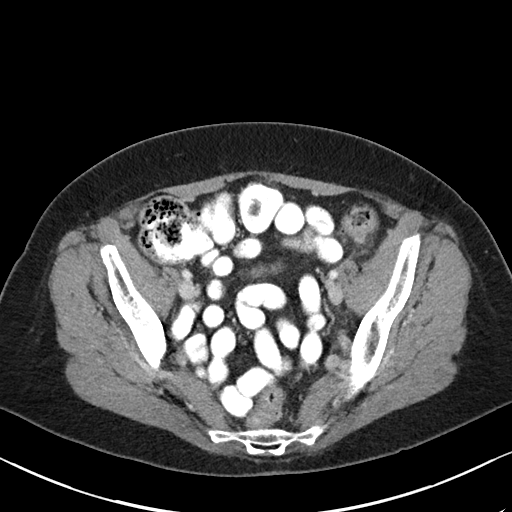
[im 34/76  soft-tissue]
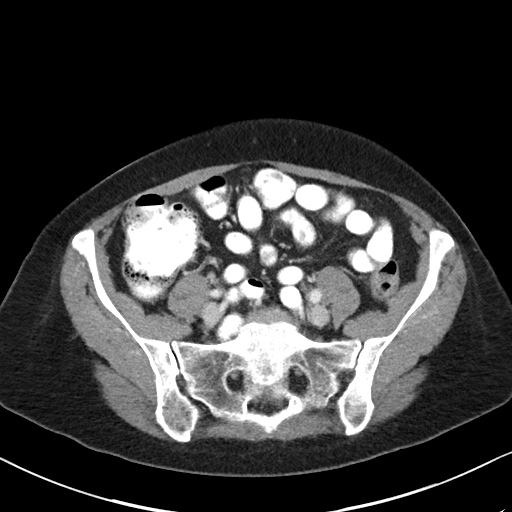
[im 38/76  soft-tissue]
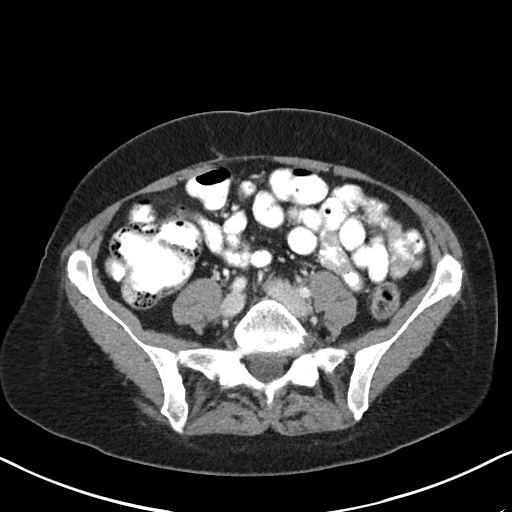
[im 42/76  soft-tissue]
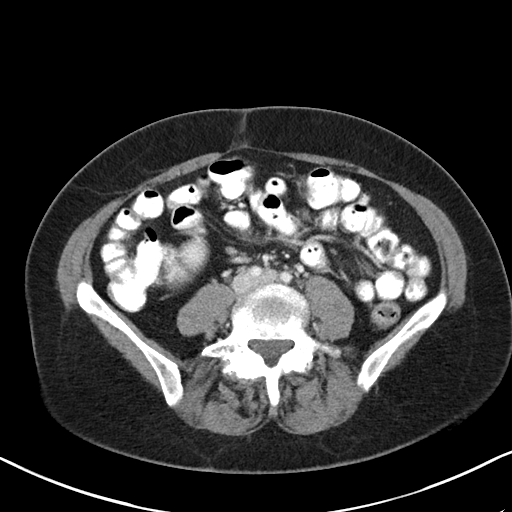
[im 51/76  soft-tissue]
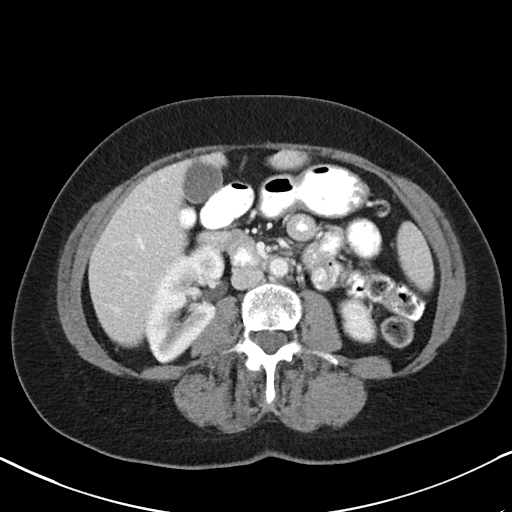
[im 51/76  bone]
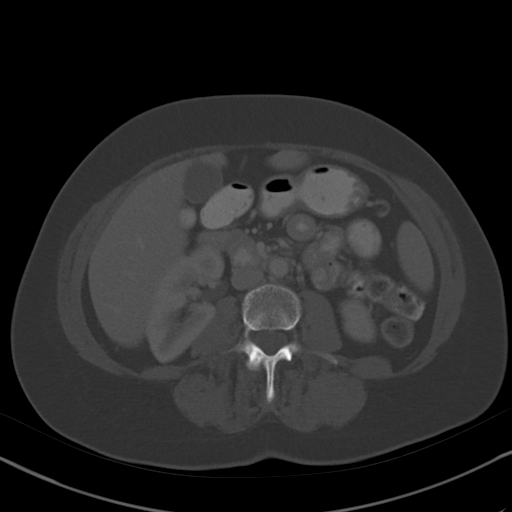
[im 55/76  soft-tissue]
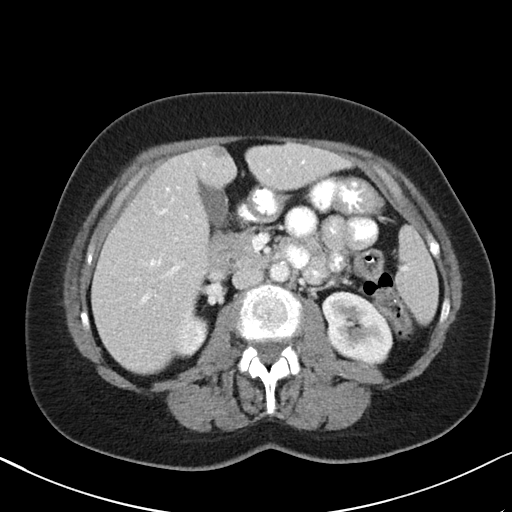
[im 59/76  soft-tissue]
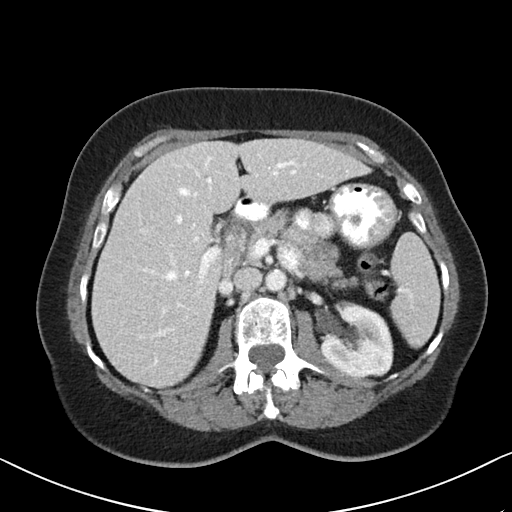
[im 67/76  soft-tissue]
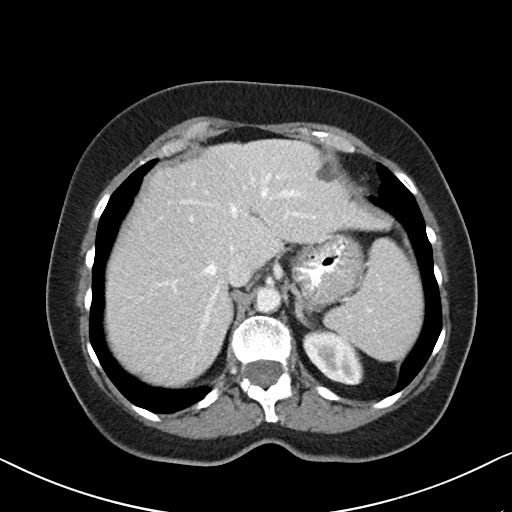
[im 71/76  soft-tissue]
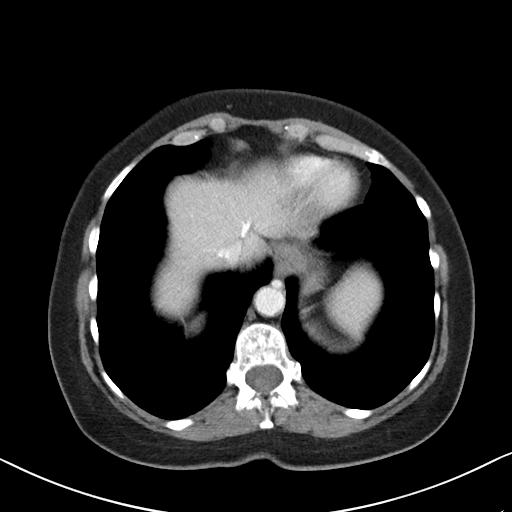

[Series 6: abd/pel w st · coronal · 0.61mm/px · 3 of 73 slices shown]
[im 25/73  soft-tissue]
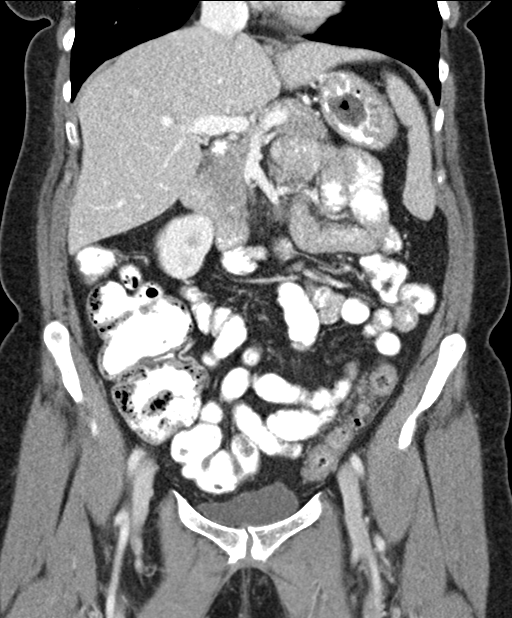
[im 33/73  soft-tissue]
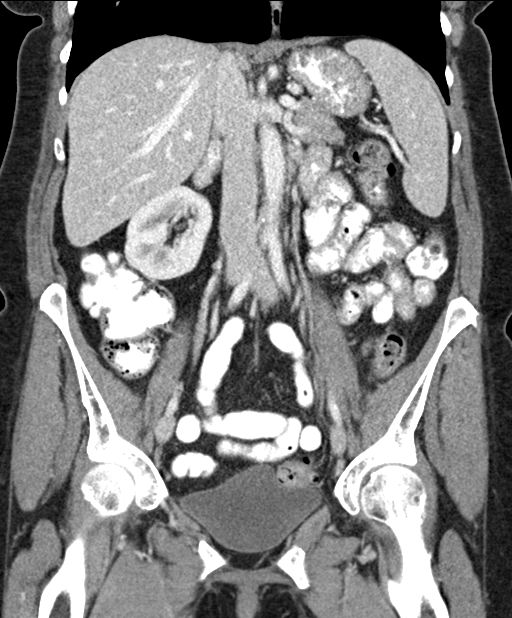
[im 41/73  soft-tissue]
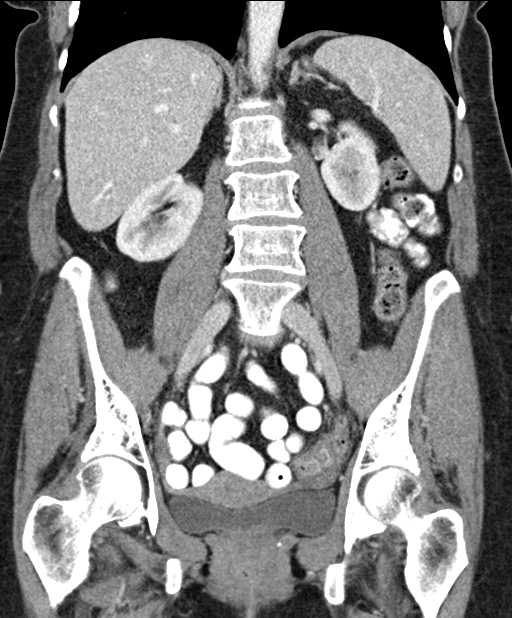

[16 of 46 positions shown; findings below may reference images not displayed]

FINDINGS: Lower chest: Minimal scarring lung bases. Heart size within normal
limits.

Hepatobiliary: Left lobe liver 1.2 cm cyst. Focal fatty infiltration
adjacent to the fissure for the falciform ligament. No worrisome
hepatic lesion. No calcified gallstones.

Pancreas: No pancreatic mass or inflammation.

Spleen: No splenic mass or enlargement.

Adrenals/Urinary Tract: No obstructing stone or hydronephrosis. No
worrisome renal or adrenal lesion.

Noncontrast filled imaging of the urinary bladder without gross
abnormality.

Stomach/Bowel: Prominent diverticula most notable descending colon
and sigmoid colon with associated muscular hypertrophy.
Additionally, subtle hazy infiltration of fat planes surrounding
distal descending colon and circumferential thickening proximal
sigmoid colon consistent with changes of mild diverticulitis without
drainable abscess or free intraperitoneal air. The bowel wall
thickening limits evaluation for detection of underlying mass.

Under distended stomach contains ingested material. No primary small
bowel abnormality noted.

Vascular/Lymphatic: Mild aortic calcified plaque without aneurysm or
large vessel occlusion.

No adenopathy.

Reproductive: No worrisome uterine or adnexal abnormality

Other: No bowel containing hernia.

Musculoskeletal: Curvature lumbar spine convex left. Lumbar
degenerative changes most notable right L4-5 facet joint. No osseous
destructive lesion.
IMPRESSION: Mild descending colon/proximal sigmoid colon diverticulitis without
drainable abscess or free intraperitoneal air. The bowel wall
thickening/muscular hypertrophy limits evaluation for detection of
underlying mass.

Aortic Atherosclerosis (JULA8-O32.2).

These results will be called to the ordering clinician or
representative by the Radiologist Assistant, and communication
documented in the PACS or zVision Dashboard.

## 2018-08-27 ENCOUNTER — Other Ambulatory Visit: Payer: Self-pay | Admitting: Nurse Practitioner

## 2019-01-02 ENCOUNTER — Telehealth: Payer: Self-pay

## 2019-01-02 NOTE — Telephone Encounter (Signed)
Refill for sertraline 100mg  requested.   Pt stated that she has the [employer] for [facility] and is not able to come in.   Please advise.

## 2019-01-08 NOTE — Telephone Encounter (Signed)
Pt want to know the status of her refill request. Please advise

## 2019-01-08 NOTE — Telephone Encounter (Signed)
Waiting on provider to respond!

## 2019-01-10 NOTE — Telephone Encounter (Signed)
Patient has made an appointment for 7/21

## 2019-01-11 ENCOUNTER — Other Ambulatory Visit: Payer: Self-pay | Admitting: Internal Medicine

## 2019-01-11 ENCOUNTER — Telehealth: Payer: Self-pay | Admitting: Internal Medicine

## 2019-01-11 DIAGNOSIS — F419 Anxiety disorder, unspecified: Secondary | ICD-10-CM

## 2019-01-11 MED ORDER — SERTRALINE HCL 100 MG PO TABS: 100.0000 mg | ORAL_TABLET | Freq: Every day | ORAL | 0 refills | Status: DC

## 2019-01-11 NOTE — Telephone Encounter (Signed)
Medication Refill - Medication: sertraline (ZOLOFT) 100 MG tablet (Patient stated pharmacy never received prescription for a short supply before upcoming appt)   Has the patient contacted their pharmacy? Yes (Agent: If no, request that the patient contact the pharmacy for the refill.) (Agent: If yes, when and what did the pharmacy advise?)Contact PCP  Preferred Pharmacy (with phone number or street name):  [id number] [address], [place] [phone] (Phone) [phone] (Fax)    Agent: Please be advised that RX refills may take up to 3 business days. We ask that you follow-up with your pharmacy.

## 2019-01-11 NOTE — Telephone Encounter (Signed)
Are you okay sending in enough sertraline til patients appointment?

## 2019-01-16 ENCOUNTER — Ambulatory Visit (INDEPENDENT_AMBULATORY_CARE_PROVIDER_SITE_OTHER): Payer: BLUE CROSS/BLUE SHIELD | Admitting: Internal Medicine

## 2019-01-16 ENCOUNTER — Other Ambulatory Visit: Payer: Self-pay

## 2019-01-16 ENCOUNTER — Encounter: Payer: Self-pay | Admitting: Internal Medicine

## 2019-01-16 VITALS — BP 138/82 | HR 77 | Temp 98.6°F | Resp 16 | Ht 66.5 in | Wt 141.5 lb

## 2019-01-16 DIAGNOSIS — E781 Pure hyperglyceridemia: Secondary | ICD-10-CM | POA: Diagnosis not present

## 2019-01-16 DIAGNOSIS — F419 Anxiety disorder, unspecified: Secondary | ICD-10-CM

## 2019-01-16 DIAGNOSIS — Z0000 Encounter for general adult medical examination without abnormal findings: Secondary | ICD-10-CM

## 2019-01-16 DIAGNOSIS — R7989 Other specified abnormal findings of blood chemistry: Secondary | ICD-10-CM

## 2019-01-16 DIAGNOSIS — Z1231 Encounter for screening mammogram for malignant neoplasm of breast: Secondary | ICD-10-CM

## 2019-01-16 MED ORDER — SERTRALINE HCL 100 MG PO TABS: 100.0000 mg | ORAL_TABLET | Freq: Every day | ORAL | 1 refills | Status: DC

## 2019-01-16 NOTE — Progress Notes (Signed)
Subjective:  Patient ID: Kathryn [name], female    DOB: 1956-06-28  Age: 63 y.o. MRN: [id number]  CC: Annual Exam   HPI Kathryn [name] presents for a CPX.  She continues to complain of mild anxiety and wants a refill of sertraline.  She is doing well at this dose.  She sleeps well and tells that her appetite is good.  She otherwise feels well and offers no other complaints.  Outpatient Medications Prior to Visit  Medication Sig Dispense Refill  . Omeprazole (PRILOSEC PO) Prilosec    . azithromycin (ZITHROMAX) 250 MG tablet Take 2 tablets today then 1 tablet daily until done 6 tablet 0  . fluticasone (FLONASE) 50 MCG/ACT nasal spray SHAKE LIQUID AND USE 2 SPRAYS IN EACH NOSTRIL DAILY 16 g 1  . sertraline (ZOLOFT) 100 MG tablet Take 1 tablet (100 mg total) by mouth daily. 30 tablet 0   No facility-administered medications prior to visit.     ROS Review of Systems  Constitutional: Negative for diaphoresis, fatigue and unexpected weight change.  HENT: Negative.   Eyes: Negative.   Respiratory: Negative for cough, chest tightness, shortness of breath and wheezing.   Cardiovascular: Negative for chest pain, palpitations and leg swelling.  Gastrointestinal: Negative for abdominal pain, constipation, diarrhea, nausea and vomiting.  Endocrine: Negative for cold intolerance and heat intolerance.  Genitourinary: Negative.  Negative for difficulty urinating.  Musculoskeletal: Negative.  Negative for arthralgias, back pain, myalgias and neck pain.  Skin: Negative.  Negative for color change and pallor.  Neurological: Negative.  Negative for dizziness, weakness, light-headedness and numbness.  Hematological: Negative for adenopathy. Does not bruise/bleed easily.  Psychiatric/Behavioral: Negative for decreased concentration, dysphoric mood, sleep disturbance and suicidal ideas. The patient is nervous/anxious.     Objective:  BP 138/82 (BP Location: Left Arm, Patient Position: Sitting, Cuff  Size: Normal)   Pulse 77   Temp 98.6 F (37 C) (Oral)   Resp 16   Ht 5' 6.5" (1.689 m)   Wt 141 lb 8 oz (64.2 kg)   SpO2 96%   BMI 22.50 kg/m   BP Readings from Last 3 Encounters:  01/16/19 138/82  06/16/18 128/80  06/30/17 120/78    Wt Readings from Last 3 Encounters:  01/16/19 141 lb 8 oz (64.2 kg)  06/16/18 142 lb (64.4 kg)  06/30/17 144 lb 12 oz (65.7 kg)    Physical Exam Vitals signs reviewed.  Constitutional:      Appearance: She is not ill-appearing or diaphoretic.  HENT:     Nose: Nose normal. No congestion or rhinorrhea.     Mouth/Throat:     Mouth: Mucous membranes are moist.     Pharynx: No oropharyngeal exudate.  Eyes:     General: No scleral icterus.    Conjunctiva/sclera: Conjunctivae normal.  Neck:     Musculoskeletal: Normal range of motion. No neck rigidity or muscular tenderness.  Cardiovascular:     Rate and Rhythm: Normal rate and regular rhythm.     Heart sounds: No murmur.  Pulmonary:     Effort: Pulmonary effort is normal.     Breath sounds: No stridor. No wheezing, rhonchi or rales.  Abdominal:     General: Abdomen is protuberant.     Palpations: Abdomen is soft. There is no hepatomegaly or splenomegaly.     Tenderness: There is no abdominal tenderness.     Hernia: No hernia is present.  Musculoskeletal: Normal range of motion.     Right lower  leg: No edema.     Left lower leg: No edema.  Lymphadenopathy:     Cervical: No cervical adenopathy.  Skin:    General: Skin is warm and dry.  Neurological:     General: No focal deficit present.     Mental Status: She is alert and oriented to person, place, and time. Mental status is at baseline.  Psychiatric:        Mood and Affect: Mood normal.        Behavior: Behavior normal.        Thought Content: Thought content normal.        Judgment: Judgment normal.     Lab Results  Component Value Date   WBC 5.4 06/30/2017   HGB 12.8 06/30/2017   HCT 38.4 06/30/2017   PLT 193.0  06/30/2017   GLUCOSE 82 06/30/2017   CHOL 194 06/30/2017   TRIG 179.0 (H) 06/30/2017   HDL 49.10 06/30/2017   LDLDIRECT 130.8 01/08/2013   LDLCALC 109 (H) 06/30/2017   ALT 13 06/30/2017   AST 17 06/30/2017   NA 141 06/30/2017   K 4.3 06/30/2017   CL 102 06/30/2017   CREATININE 0.79 06/30/2017   BUN 12 06/30/2017   CO2 32 06/30/2017   TSH 3.95 06/30/2017   INR 1.0 09/10/2015   HGBA1C 5.8 06/30/2017    Ct Abdomen Pelvis W Contrast  Result Date: 05/30/2017 CLINICAL DATA:  63 year old female with left lower quadrant abdominal tenderness for 3 weeks. Antibiotics with some relief. Prior appendectomy. Initial encounter. EXAM: CT ABDOMEN AND PELVIS WITH CONTRAST TECHNIQUE: Multidetector CT imaging of the abdomen and pelvis was performed using the standard protocol following bolus administration of intravenous contrast. CONTRAST:  100mL ISOVUE-300 IOPAMIDOL (ISOVUE-300) INJECTION 61% COMPARISON:  None. FINDINGS: Lower chest: Minimal scarring lung bases. Heart size within normal limits. Hepatobiliary: Left lobe liver 1.2 cm cyst. Focal fatty infiltration adjacent to the fissure for the falciform ligament. No worrisome hepatic lesion. No calcified gallstones. Pancreas: No pancreatic mass or inflammation. Spleen: No splenic mass or enlargement. Adrenals/Urinary Tract: No obstructing stone or hydronephrosis. No worrisome renal or adrenal lesion. Noncontrast filled imaging of the urinary bladder without gross abnormality. Stomach/Bowel: Prominent diverticula most notable descending colon and sigmoid colon with associated muscular hypertrophy. Additionally, subtle hazy infiltration of fat planes surrounding distal descending colon and circumferential thickening proximal sigmoid colon consistent with changes of mild diverticulitis without drainable abscess or free intraperitoneal air. The bowel wall thickening limits evaluation for detection of underlying mass. Under distended stomach contains ingested  material. No primary small bowel abnormality noted. Vascular/Lymphatic: Mild aortic calcified plaque without aneurysm or large vessel occlusion. No adenopathy. Reproductive: No worrisome uterine or adnexal abnormality Other: No bowel containing hernia. Musculoskeletal: Curvature lumbar spine convex left. Lumbar degenerative changes most notable right L4-5 facet joint. No osseous destructive lesion. IMPRESSION: Mild descending colon/proximal sigmoid colon diverticulitis without drainable abscess or free intraperitoneal air. The bowel wall thickening/muscular hypertrophy limits evaluation for detection of underlying mass. Aortic Atherosclerosis (ICD10-I70.0). These results will be called to the ordering clinician or representative by the Radiologist Assistant, and communication documented in the PACS or zVision Dashboard. Electronically Signed   By: Lacy DuverneySteven  Olson M.D.   On: 05/30/2017 16:45    Assessment & Plan:   Kathryn [name] was seen today for annual exam.  Diagnoses and all orders for this visit:  Routine general medical examination at a health care facility-  Exam completed, labs ordered, vaccines reviewed, screening for colon cancer is up-to-date, she  tells me she had a Pap smear done in January 2019-no Pap smear due at this time based on that information, she is due for follow-up mammogram, patient education material was given. -     Lipid panel; Future  Elevated TSH- She appears euthyroid.  I will monitor her TSH level. -     TSH; Future  Hyperglyceridemia, pure- I will recheck her fasting lipids and will treat if indicated. -     CBC with Differential/Platelet; Future -     Basic metabolic panel; Future -     Hepatic function panel; Future  Anxiety- She wants to stay on the current dose of sertraline. -     sertraline (ZOLOFT) 100 MG tablet; Take 1 tablet (100 mg total) by mouth daily.  Visit for screening mammogram -     MM DIGITAL SCREENING BILATERAL; Future   I have discontinued  Jairy A. Babino's azithromycin and fluticasone. I am also having her maintain her Omeprazole (PRILOSEC PO) and sertraline.  Meds ordered this encounter  Medications  . sertraline (ZOLOFT) 100 MG tablet    Sig: Take 1 tablet (100 mg total) by mouth daily.    Dispense:  90 tablet    Refill:  1     Follow-up: No follow-ups on file.  Sanda Lingerhomas Ori Kreiter, MD

## 2019-01-17 DIAGNOSIS — Z1231 Encounter for screening mammogram for malignant neoplasm of breast: Secondary | ICD-10-CM | POA: Insufficient documentation

## 2019-02-06 ENCOUNTER — Other Ambulatory Visit: Payer: Self-pay | Admitting: Internal Medicine

## 2019-02-06 DIAGNOSIS — F419 Anxiety disorder, unspecified: Secondary | ICD-10-CM

## 2019-02-06 MED ORDER — SERTRALINE HCL 100 MG PO TABS: 100.0000 mg | ORAL_TABLET | Freq: Every day | ORAL | 1 refills | Status: DC

## 2019-02-22 DIAGNOSIS — F330 Major depressive disorder, recurrent, mild: Secondary | ICD-10-CM | POA: Insufficient documentation

## 2019-02-22 DIAGNOSIS — R5382 Chronic fatigue, unspecified: Secondary | ICD-10-CM | POA: Insufficient documentation

## 2019-05-28 DIAGNOSIS — D649 Anemia, unspecified: Secondary | ICD-10-CM | POA: Insufficient documentation

## 2019-12-02 ENCOUNTER — Encounter: Payer: Self-pay | Admitting: Internal Medicine

## 2019-12-02 NOTE — Progress Notes (Signed)
Annual Screening/Preventative Visit & Comprehensive Evaluation &  Examination     This very nice 64 y.o. MWF  presents as a new patient to establish care for a Screening /Preventative Visit & comprehensive evaluation and management of multiple medical co-morbidities.  Patient has problems identified as  HLD, Prediabetes  and Vitamin D Deficiency. Patient has GERD controlled on her meds.      Patient's BP has been controlled at home and patient denies any cardiac symptoms as chest pain, palpitations, shortness of breath, dizziness or ankle swelling. Today's BP at goal - 114/76.       Patient's hyperlipidemia is not controlled with diet. Last lipids were not at goal:  Lab Results  Component Value Date   CHOL 194 06/30/2017   HDL 49.10 06/30/2017   LDLCALC 109 (H) 06/30/2017   LDLDIRECT 130.8 01/08/2013   TRIG 179.0 (H) 06/30/2017   CHOLHDL 4 06/30/2017       Patient has hx/o prediabetes predating since  2019  and patient denies reactive hypoglycemic symptoms, visual blurring, diabetic polys or paresthesias. Last A1c was not at goal:  Lab Results  Component Value Date   HGBA1C 5.8 06/30/2017       Finally, patient has no history of Vitamin D being monitored.  Current Outpatient Medications on File Prior to Visit  Medication Sig  . Cholecalciferol (VITAMIN D) 125 MCG (5000 UT) CAPS Take 1 capsule by mouth daily.  . Omeprazole (PRILOSEC PO) 20 mg daily.   Marland Kitchen OVER THE COUNTER MEDICATION OTC iron 65 mg daily.  . sertraline (ZOLOFT) 100 MG tablet Take 1 tablet (100 mg total) by mouth daily. (Patient taking differently: Take 100 mg by mouth daily. Takes 1.5 tablets daily.)  . vitamin C (ASCORBIC ACID) 250 MG tablet Take 250 mg by mouth daily. 1 gummy daily.   No current facility-administered medications on file prior to visit.   Allergies  Allergen Reactions  . Amoxil [Amoxicillin] Itching  . Penicillins Rash    ? Rash as child   Past Medical History:  Diagnosis Date  . Anxiety    . Diverticulitis 2016  . Fracture of fifth metacarpal bone of left hand 09/03/2011   post fall  . GERD (gastroesophageal reflux disease)   . Osteoarthritis    Health Maintenance  Topic Date Due  . MAMMOGRAM  07/09/2019  . INFLUENZA VACCINE  01/27/2020  . PAP SMEAR-Modifier  07/13/2020  . Fecal DNA (Cologuard)  07/15/2020  . TETANUS/TDAP  04/05/2027  . Hepatitis C Screening  Completed  . HIV Screening  Completed   Immunization History  Administered Date(s) Administered  . Influenza,inj,Quad PF,6+ Mos 04/04/2017  . Influenza-Unspecified 04/06/2013, 04/28/2018  . PPD Test 12/03/2019  . Td 10/03/2006  . Tdap 04/04/2017    Last Colon - Dec 2020 - at [facility] - had a negative  EGD, Capsule Endoscopy & Colonoscopy to evaluate for Iron deficiency (w/u was negative and Low iron most likely due to her Proilosec)  Last MGM and dexaBMD - 06/2019 at [facility]   Past Surgical History:  Procedure Laterality Date  . APPENDECTOMY    . COLONOSCOPY     negative; Dr Jarold Motto  . ORIF FINGER FRACTURE  09/03/2011   Procedure: OPEN REDUCTION INTERNAL FIXATION (ORIF) METACARPAL (FINGER) FRACTURE;  Surgeon: Eulas Post, MD;  Location: [facility];  Service: Orthopedics;  Laterality: Left;  . TUBAL LIGATION     Family History  Problem Relation Age of Onset  .  Colitis Mother   . COPD Mother   . Heart attack Mother 74  . Heart disease Maternal Grandfather   . Breast cancer Other        MGaunt  . Diabetes Neg Hx   . Stroke Neg Hx   . Colon cancer Neg Hx   . Colon polyps Neg Hx   . Kidney disease Neg Hx   . Gallbladder disease Neg Hx   . Esophageal cancer Neg Hx    Social History   Tobacco Use  . Smoking status: Never Smoker  . Smokeless tobacco: Never Used  Substance Use Topics  . Alcohol use: No    Alcohol/week: 0.0 standard drinks  . Drug use: No    ROS Constitutional: Denies fever, chills, weight loss/gain, headaches, insomnia,  night  sweats, and change in appetite. Does c/o fatigue. Eyes: Denies redness, blurred vision, diplopia, discharge, itchy, watery eyes.  ENT: Denies discharge, congestion, post nasal drip, epistaxis, sore throat, earache, hearing loss, dental pain, Tinnitus, Vertigo, Sinus pain, snoring.  Cardio: Denies chest pain, palpitations, irregular heartbeat, syncope, dyspnea, diaphoresis, orthopnea, PND, claudication, edema Respiratory: denies cough, dyspnea, DOE, pleurisy, hoarseness, laryngitis, wheezing.  Gastrointestinal: Denies dysphagia, heartburn, reflux, water brash, pain, cramps, nausea, vomiting, bloating, diarrhea, constipation, hematemesis, melena, hematochezia, jaundice, hemorrhoids Genitourinary: Denies dysuria, frequency, urgency, nocturia, hesitancy, discharge, hematuria, flank pain Breast: Breast lumps, nipple discharge, bleeding.  Musculoskeletal: Denies arthralgia, myalgia, stiffness, Jt. Swelling, pain, limp, and strain/sprain. Denies falls. Skin: Denies puritis, rash, hives, warts, acne, eczema, changing in skin lesion Neuro: No weakness, tremor, incoordination, spasms, paresthesia, pain Psychiatric: Denies confusion, memory loss, sensory loss. Denies Depression. Endocrine: Denies change in weight, skin, hair change, nocturia, and paresthesia, diabetic polys, visual blurring, hyper / hypo glycemic episodes.  Heme/Lymph: No excessive bleeding, bruising, enlarged lymph nodes.  Physical Exam  BP 114/76   Pulse 88   Temp (!) 97.2 F (36.2 C)   Resp 16   Ht 5' 7.5" (1.715 m)   Wt 142 lb 9.6 oz (64.7 kg)   BMI 22.00 kg/m   General Appearance: Well nourished, well groomed and in no apparent distress.  Eyes: PERRLA, EOMs, conjunctiva no swelling or erythema, normal fundi and vessels. Sinuses: No frontal/maxillary tenderness ENT/Mouth: EACs patent / TMs  nl. Nares clear without erythema, swelling, mucoid exudates. Oral hygiene is good. No erythema, swelling, or exudate. Tongue normal,  non-obstructing. Tonsils not swollen or erythematous. Hearing normal.  Neck: Supple, thyroid not palpable. No bruits, nodes or JVD. Respiratory: Respiratory effort normal.  BS equal and clear bilateral without rales, rhonci, wheezing or stridor. Cardio: Heart sounds are normal with regular rate and rhythm and no murmurs, rubs or gallops. Peripheral pulses are normal and equal bilaterally without edema. No aortic or femoral bruits. Chest: symmetric with normal excursions and percussion. Breasts: Symmetric, without lumps, nipple discharge, retractions, or fibrocystic changes.  Abdomen: Flat, soft with bowel sounds active. Nontender, no guarding, rebound, hernias, masses, or organomegaly.  Lymphatics: Non tender without lymphadenopathy.  Genitourinary:  Musculoskeletal: Full ROM all peripheral extremities, joint stability, 5/5 strength, and normal gait. Skin: Warm and dry without rashes, lesions, cyanosis, clubbing or  ecchymosis.  Neuro: Cranial nerves intact, reflexes equal bilaterally. Normal muscle tone, no cerebellar symptoms. Sensation intact.  Pysch: Alert and oriented X 3, normal affect, Insight and Judgment appropriate.   Assessment and Plan  1. Annual Preventative Screening Examination  2. Elevated BP without diagnosis of hypertension  - EKG 12-Lead - [place], RETROPERITNL ABD,  LTD - Urinalysis,  Routine w reflex microscopic - Microalbumin / creatinine urine ratio - CBC with Differential/Platelet - COMPLETE METABOLIC PANEL WITH GFR - Magnesium - TSH  3. Hyperlipidemia, mixed  - EKG 12-Lead - [place], RETROPERITNL ABD,  LTD - Lipid panel - TSH  4. Abnormal glucose  - EKG 12-Lead - [place], RETROPERITNL ABD,  LTD - Hemoglobin A1c - Insulin, random  5. Vitamin D deficiency  - VITAMIN D 25 Hydroxy  6. Hypothyroidism, unspecified type  - TSH  7. Screening for colorectal cancer  - POC Hemoccult Bld/Stl (3-Cd Home Screen); Future  8. Screening for ischemic heart disease  -  EKG 12-Lead  9. Screening for AAA (aortic abdominal aneurysm)  - [place], RETROPERITNL ABD,  LTD  10. FHx: heart disease  - EKG 12-Lead - [place], RETROPERITNL ABD,  LTD  11. Fatigue  - Iron,Total/Total Iron Binding Cap - Vitamin B12  12. Medication management  - Urinalysis, Routine w reflex microscopic - Microalbumin / creatinine urine ratio - CBC with Differential/Platelet - COMPLETE METABOLIC PANEL WITH GFR - Magnesium - Lipid panel - TSH - Hemoglobin A1c - Insulin, random - VITAMIN D 25 Hydroxy  13. Screening-pulmonary TB  - TB Skin Test         Patient was counseled in prudent diet to achieve/maintain BMI less than 25 for weight control, BP monitoring, regular exercise and medications. Discussed med's effects and SE's. Screening labs and tests as requested with regular follow-up as recommended. Over 40 minutes of exam, counseling, chart review and high complex critical decision making was performed.   Marinus Maw, MD

## 2019-12-02 NOTE — Patient Instructions (Signed)

## 2019-12-03 ENCOUNTER — Ambulatory Visit: Payer: 59 | Admitting: Internal Medicine

## 2019-12-03 ENCOUNTER — Other Ambulatory Visit: Payer: Self-pay

## 2019-12-03 VITALS — BP 114/76 | HR 88 | Temp 97.2°F | Resp 16 | Ht 67.5 in | Wt 142.6 lb

## 2019-12-03 DIAGNOSIS — Z8249 Family history of ischemic heart disease and other diseases of the circulatory system: Secondary | ICD-10-CM | POA: Diagnosis not present

## 2019-12-03 DIAGNOSIS — Z13220 Encounter for screening for lipoid disorders: Secondary | ICD-10-CM

## 2019-12-03 DIAGNOSIS — Z0000 Encounter for general adult medical examination without abnormal findings: Secondary | ICD-10-CM

## 2019-12-03 DIAGNOSIS — Z1389 Encounter for screening for other disorder: Secondary | ICD-10-CM | POA: Diagnosis not present

## 2019-12-03 DIAGNOSIS — Z131 Encounter for screening for diabetes mellitus: Secondary | ICD-10-CM

## 2019-12-03 DIAGNOSIS — R030 Elevated blood-pressure reading, without diagnosis of hypertension: Secondary | ICD-10-CM | POA: Diagnosis not present

## 2019-12-03 DIAGNOSIS — Z0001 Encounter for general adult medical examination with abnormal findings: Secondary | ICD-10-CM

## 2019-12-03 DIAGNOSIS — F419 Anxiety disorder, unspecified: Secondary | ICD-10-CM

## 2019-12-03 DIAGNOSIS — Z111 Encounter for screening for respiratory tuberculosis: Secondary | ICD-10-CM

## 2019-12-03 DIAGNOSIS — K2100 Gastro-esophageal reflux disease with esophagitis, without bleeding: Secondary | ICD-10-CM

## 2019-12-03 DIAGNOSIS — Z79899 Other long term (current) drug therapy: Secondary | ICD-10-CM

## 2019-12-03 DIAGNOSIS — E559 Vitamin D deficiency, unspecified: Secondary | ICD-10-CM | POA: Diagnosis not present

## 2019-12-03 DIAGNOSIS — Z136 Encounter for screening for cardiovascular disorders: Secondary | ICD-10-CM | POA: Diagnosis not present

## 2019-12-03 DIAGNOSIS — R7309 Other abnormal glucose: Secondary | ICD-10-CM

## 2019-12-03 DIAGNOSIS — E782 Mixed hyperlipidemia: Secondary | ICD-10-CM

## 2019-12-03 DIAGNOSIS — Z130 Encounter for screening for diseases of the blood and blood-forming organs and certain disorders involving the immune mechanism: Secondary | ICD-10-CM

## 2019-12-03 DIAGNOSIS — Z1211 Encounter for screening for malignant neoplasm of colon: Secondary | ICD-10-CM

## 2019-12-03 DIAGNOSIS — R5383 Other fatigue: Secondary | ICD-10-CM

## 2019-12-03 DIAGNOSIS — E039 Hypothyroidism, unspecified: Secondary | ICD-10-CM

## 2019-12-03 DIAGNOSIS — Z1212 Encounter for screening for malignant neoplasm of rectum: Secondary | ICD-10-CM

## 2019-12-03 MED ORDER — OMEPRAZOLE 40 MG PO CPDR: DELAYED_RELEASE_CAPSULE | ORAL | 3 refills | Status: DC

## 2019-12-03 MED ORDER — SERTRALINE HCL 100 MG PO TABS: ORAL_TABLET | ORAL | 3 refills | Status: DC

## 2019-12-04 LAB — MAGNESIUM: Magnesium: 2.3 mg/dL (ref 1.5–2.5)

## 2019-12-04 LAB — URINALYSIS, ROUTINE W REFLEX MICROSCOPIC
Bilirubin Urine: NEGATIVE
Glucose, UA: NEGATIVE
Hgb urine dipstick: NEGATIVE
Ketones, ur: NEGATIVE
Leukocytes,Ua: NEGATIVE
Nitrite: NEGATIVE
Protein, ur: NEGATIVE
Specific Gravity, Urine: 1.009 (ref 1.001–1.03)
pH: 6.5 (ref 5.0–8.0)

## 2019-12-04 LAB — CBC WITH DIFFERENTIAL/PLATELET
Absolute Monocytes: 336 cells/uL (ref 200–950)
Basophils Absolute: 0 cells/uL (ref 0–200)
Basophils Relative: 0 %
Eosinophils Absolute: 50 cells/uL (ref 15–500)
Eosinophils Relative: 1.2 %
HCT: 37.7 % (ref 35.0–45.0)
Hemoglobin: 12.7 g/dL (ref 11.7–15.5)
Lymphs Abs: 911 cells/uL (ref 850–3900)
MCH: 30.3 pg (ref 27.0–33.0)
MCHC: 33.7 g/dL (ref 32.0–36.0)
MCV: 90 fL (ref 80.0–100.0)
MPV: 12.1 fL (ref 7.5–12.5)
Monocytes Relative: 8 %
Neutro Abs: 2902 cells/uL (ref 1500–7800)
Neutrophils Relative %: 69.1 %
Platelets: 193 10*3/uL (ref 140–400)
RBC: 4.19 10*6/uL (ref 3.80–5.10)
RDW: 13.1 % (ref 11.0–15.0)
Total Lymphocyte: 21.7 %
WBC: 4.2 10*3/uL (ref 3.8–10.8)

## 2019-12-04 LAB — COMPLETE METABOLIC PANEL WITH GFR
AG Ratio: 1.9 (calc) (ref 1.0–2.5)
ALT: 20 U/L (ref 6–29)
AST: 25 U/L (ref 10–35)
Albumin: 4.4 g/dL (ref 3.6–5.1)
Alkaline phosphatase (APISO): 80 U/L (ref 37–153)
BUN: 14 mg/dL (ref 7–25)
CO2: 32 mmol/L (ref 20–32)
Calcium: 9.7 mg/dL (ref 8.6–10.4)
Chloride: 103 mmol/L (ref 98–110)
Creat: 0.78 mg/dL (ref 0.50–0.99)
GFR, Est African American: 94 mL/min/{1.73_m2} (ref >=60)
GFR, Est Non African American: 81 mL/min/{1.73_m2} (ref >=60)
Globulin: 2.3 g/dL (calc) (ref 1.9–3.7)
Glucose, Bld: 92 mg/dL (ref 65–99)
Potassium: 4.8 mmol/L (ref 3.5–5.3)
Sodium: 139 mmol/L (ref 135–146)
Total Bilirubin: 0.4 mg/dL (ref 0.2–1.2)
Total Protein: 6.7 g/dL (ref 6.1–8.1)

## 2019-12-04 LAB — HEMOGLOBIN A1C
Hgb A1c MFr Bld: 5.3 % of total Hgb (ref <5.7)
Mean Plasma Glucose: 105 (calc)
eAG (mmol/L): 5.8 (calc)

## 2019-12-04 LAB — LIPID PANEL
Cholesterol: 224 mg/dL — ABNORMAL HIGH (ref <200)
HDL: 43 mg/dL — ABNORMAL LOW (ref >=50)
LDL Cholesterol (Calc): 135 mg/dL (calc) — ABNORMAL HIGH
Non-HDL Cholesterol (Calc): 181 mg/dL (calc) — ABNORMAL HIGH (ref <130)
Total CHOL/HDL Ratio: 5.2 (calc) — ABNORMAL HIGH (ref <5.0)
Triglycerides: 321 mg/dL — ABNORMAL HIGH (ref <150)

## 2019-12-04 LAB — VITAMIN D 25 HYDROXY (VIT D DEFICIENCY, FRACTURES): Vit D, 25-Hydroxy: 69 ng/mL (ref 30–100)

## 2019-12-04 LAB — IRON, TOTAL/TOTAL IRON BINDING CAP
%SAT: 31 % (calc) (ref 16–45)
Iron: 99 ug/dL (ref 45–160)
TIBC: 323 mcg/dL (calc) (ref 250–450)

## 2019-12-04 LAB — MICROALBUMIN / CREATININE URINE RATIO
Creatinine, Urine: 30 mg/dL (ref 20–275)
Microalb, Ur: <0.2 mg/dL

## 2019-12-04 LAB — TSH: TSH: 6.16 mIU/L — ABNORMAL HIGH (ref 0.40–4.50)

## 2019-12-04 LAB — VITAMIN B12: Vitamin B-12: 396 pg/mL (ref 200–1100)

## 2019-12-04 LAB — INSULIN, RANDOM: Insulin: 6.2 u[IU]/mL

## 2020-03-20 DIAGNOSIS — E559 Vitamin D deficiency, unspecified: Secondary | ICD-10-CM | POA: Insufficient documentation

## 2020-03-20 DIAGNOSIS — R7309 Other abnormal glucose: Secondary | ICD-10-CM | POA: Insufficient documentation

## 2020-03-20 DIAGNOSIS — E538 Deficiency of other specified B group vitamins: Secondary | ICD-10-CM | POA: Insufficient documentation

## 2020-03-20 NOTE — Progress Notes (Signed)
FOLLOW UP  Assessment and Plan:   Cholesterol Currently with moderate elevations, low risk history working on lifestyle, has reduced animal fats, increasing fiber intake Strong preference to avoid medication if possible  Continue low cholesterol diet and exercise.  Check lipid panel.   Hx of prediabetes Recent A1Cs at goal Discussed diet/exercise, weight management  Defer A1C; check CMP  GERD Try moving omeprazole to PM; if still having breakthrough, can add famotidine/pepcid OTC in AM as needed, or switch back to omeprazole 20 mg BID if remains poorly controlled  Elevated TSH Hx of mild elevations resolved; recurrent elevation last visit Recheck for resolution vs need for low dose levothyroxine  Vitamin D Def At goal at last visit; continue supplementation to maintain goal of 60-100 Defer Vit D level  B12 def Has added sublingual b12; check levels today  Hx of recurrent iron def Likely r/t frequent donations; on BID iron; will try fusion plus Discussed separate from reflux meds by 4 hours Dietary sources of iron discussed   Continue diet and meds as discussed. Further disposition pending results of labs. Discussed med's effects and SE's.   Over 30 minutes of exam, counseling, chart review, and critical decision making was performed.   Future Appointments  Date Time Provider Department Center  12/12/2020 10:00 AM Lucky Cowboy, MD [facility] None    ----------------------------------------------------------------------------------------------------------------------  HPI 64 y.o. female  presents for 3 month follow up on cholesterol, elevated TSH, anxiety, GERD, vitamin D deficiency.   She has hx of anxiety treated by sertraline 100 mg daily.   GERD recently taking omprazole 40 mg in AM, reports some night time breakthrough.   BMI is Body mass index is 21.17 kg/m., she has been working on diet and exercise, very active in yard, watching her young grandson, hits  8000-10000 steps on fitbit daily . Wt Readings from Last 3 Encounters:  03/21/20 137 lb 3.2 oz (62.2 kg)  12/03/19 142 lb 9.6 oz (64.7 kg)  01/16/19 141 lb 8 oz (64.2 kg)   Her blood pressure has been controlled at home, today their BP is BP: 100/68  She does not workout but generally active. She denies chest pain, shortness of breath, dizziness.   She is not on cholesterol medication. Low risk history, strong preference for lifestyle changes.  Her cholesterol is not at goal. The cholesterol last visit was:   Lab Results  Component Value Date   CHOL 224 (H) 12/03/2019   HDL 43 (L) 12/03/2019   LDLCALC 135 (H) 12/03/2019   LDLDIRECT 130.8 01/08/2013   TRIG 321 (H) 12/03/2019   CHOLHDL 5.2 (H) 12/03/2019    She has been working on diet and exercise for hx of prediabetes, and denies increased appetite, nausea, paresthesia of the feet, polydipsia, polyuria and visual disturbances. Last A1C in the office was:  Lab Results  Component Value Date   HGBA1C 5.3 12/03/2019   TSh was mildly elevated at last check; she endorses mild fatigue, thinning hair; denies weight gain, dry skin, constipation.  Lab Results  Component Value Date   TSH 6.16 (H) 12/03/2019    Patient is on Vitamin D supplement.   Lab Results  Component Value Date   VD25OH 69 12/03/2019     B12 was found to be borderline low, recommended for sublingual supplement, has added in addition to b complex  Lab Results  Component Value Date   VITAMINB12 396 12/03/2019   She reports hx of intermittent mild iron def, donates blood/plasma frequently, occasionally can't  give due to being too low, taking iron pill twice daily.  Lab Results  Component Value Date   IRON 99 12/03/2019   TIBC 323 12/03/2019   FERRITIN 16.1 09/10/2015     Current Medications:  Current Outpatient Medications on File Prior to Visit  Medication Sig  . b complex vitamins capsule Take 1 capsule by mouth daily.  . Cholecalciferol (VITAMIN D) 125 MCG  (5000 UT) CAPS Take 1 capsule by mouth daily.  . Cyanocobalamin (B-12) 500 MCG SUBL Place under the tongue daily.  Marland Kitchen omeprazole (PRILOSEC) 40 MG capsule Take 1 capsule Daily prevent Indigestion & Heartburn  . OVER THE COUNTER MEDICATION OTC iron 65 mg daily.  . sertraline (ZOLOFT) 100 MG tablet Take 1 & 1/2 tablet  (150 mg)  Daily for Mood  . vitamin C (ASCORBIC ACID) 250 MG tablet Take 250 mg by mouth daily. 1 gummy daily.  . Zinc 50 MG CAPS Take by mouth daily.  . [DISCONTINUED] Omeprazole (PRILOSEC PO) 20 mg daily.    No current facility-administered medications on file prior to visit.     Allergies:  Allergies  Allergen Reactions  . Amoxil [Amoxicillin] Itching  . Penicillins Rash    ? Rash as child     Medical History:  Past Medical History:  Diagnosis Date  . Anxiety   . Diverticulitis 2016  . Fracture of fifth metacarpal bone of left hand 09/03/2011   post fall  . GERD (gastroesophageal reflux disease)   . Osteoarthritis    Family history- Reviewed and unchanged Social history- Reviewed and unchanged   Review of Systems:  Review of Systems  Constitutional: Negative for malaise/fatigue and weight loss.  HENT: Negative for hearing loss and tinnitus.   Eyes: Negative for blurred vision and double vision.  Respiratory: Negative for cough, shortness of breath and wheezing.   Cardiovascular: Negative for chest pain, palpitations, orthopnea, claudication and leg swelling.  Gastrointestinal: Positive for heartburn (mild night time breakthrough taking PPI in AM). Negative for abdominal pain, blood in stool, constipation, diarrhea, melena, nausea and vomiting.  Genitourinary: Negative.   Musculoskeletal: Negative for joint pain and myalgias.  Skin: Negative for rash.  Neurological: Negative for dizziness, tingling, sensory change, weakness and headaches.  Endo/Heme/Allergies: Negative for polydipsia.  Psychiatric/Behavioral: Negative.   All other systems reviewed and are  negative.   Physical Exam: BP 100/68   Pulse 81   Temp (!) 97.5 F (36.4 C)   Wt 137 lb 3.2 oz (62.2 kg)   SpO2 93%   BMI 21.17 kg/m  Wt Readings from Last 3 Encounters:  03/21/20 137 lb 3.2 oz (62.2 kg)  12/03/19 142 lb 9.6 oz (64.7 kg)  01/16/19 141 lb 8 oz (64.2 kg)   General Appearance: Well nourished, in no apparent distress. Eyes: PERRLA, EOMs, conjunctiva no swelling or erythema Sinuses: No Frontal/maxillary tenderness ENT/Mouth: Ext aud canals clear, TMs without erythema, bulging. No erythema, swelling, or exudate on post pharynx.  Tonsils not swollen or erythematous. Hearing normal.  Neck: Supple, thyroid normal.  Respiratory: Respiratory effort normal, BS equal bilaterally without rales, rhonchi, wheezing or stridor.  Cardio: RRR with no MRGs. Brisk peripheral pulses without edema.  Abdomen: Soft, + BS.  Non tender, no guarding, rebound, hernias, masses. Lymphatics: Non tender without lymphadenopathy.  Musculoskeletal: Full ROM, 5/5 strength, Normal gait Skin: Warm, dry without rashes, lesions, ecchymosis.  Neuro: Cranial nerves intact. No cerebellar symptoms.  Psych: Awake and oriented X 3, normal affect, Insight and Judgment appropriate.  Dan Maker, NP 10:17 AM [facility]

## 2020-03-21 ENCOUNTER — Encounter: Payer: Self-pay | Admitting: Adult Health

## 2020-03-21 ENCOUNTER — Ambulatory Visit: Payer: 59 | Admitting: Adult Health

## 2020-03-21 ENCOUNTER — Other Ambulatory Visit: Payer: Self-pay

## 2020-03-21 VITALS — BP 100/68 | HR 81 | Temp 97.5°F | Wt 137.2 lb

## 2020-03-21 DIAGNOSIS — K219 Gastro-esophageal reflux disease without esophagitis: Secondary | ICD-10-CM

## 2020-03-21 DIAGNOSIS — E559 Vitamin D deficiency, unspecified: Secondary | ICD-10-CM

## 2020-03-21 DIAGNOSIS — E538 Deficiency of other specified B group vitamins: Secondary | ICD-10-CM | POA: Diagnosis not present

## 2020-03-21 DIAGNOSIS — E782 Mixed hyperlipidemia: Secondary | ICD-10-CM | POA: Diagnosis not present

## 2020-03-21 DIAGNOSIS — Z6821 Body mass index (BMI) 21.0-21.9, adult: Secondary | ICD-10-CM

## 2020-03-21 DIAGNOSIS — F419 Anxiety disorder, unspecified: Secondary | ICD-10-CM

## 2020-03-21 DIAGNOSIS — R7309 Other abnormal glucose: Secondary | ICD-10-CM | POA: Diagnosis not present

## 2020-03-21 DIAGNOSIS — R7989 Other specified abnormal findings of blood chemistry: Secondary | ICD-10-CM

## 2020-03-21 DIAGNOSIS — E611 Iron deficiency: Secondary | ICD-10-CM

## 2020-03-21 LAB — COMPLETE METABOLIC PANEL WITH GFR
AG Ratio: 2 (calc) (ref 1.0–2.5)
ALT: 14 U/L (ref 6–29)
AST: 22 U/L (ref 10–35)
Albumin: 3.8 g/dL (ref 3.6–5.1)
Alkaline phosphatase (APISO): 65 U/L (ref 37–153)
BUN: 10 mg/dL (ref 7–25)
CO2: 31 mmol/L (ref 20–32)
Calcium: 9.5 mg/dL (ref 8.6–10.4)
Chloride: 105 mmol/L (ref 98–110)
Creat: 0.83 mg/dL (ref 0.50–0.99)
GFR, Est African American: 88 mL/min/{1.73_m2} (ref >=60)
GFR, Est Non African American: 76 mL/min/{1.73_m2} (ref >=60)
Globulin: 1.9 g/dL (calc) (ref 1.9–3.7)
Glucose, Bld: 73 mg/dL (ref 65–99)
Potassium: 5.1 mmol/L (ref 3.5–5.3)
Sodium: 141 mmol/L (ref 135–146)
Total Bilirubin: 0.4 mg/dL (ref 0.2–1.2)
Total Protein: 5.7 g/dL — ABNORMAL LOW (ref 6.1–8.1)

## 2020-03-21 LAB — LIPID PANEL
Cholesterol: 215 mg/dL — ABNORMAL HIGH (ref <200)
HDL: 44 mg/dL — ABNORMAL LOW (ref >=50)
LDL Cholesterol (Calc): 134 mg/dL (calc) — ABNORMAL HIGH
Non-HDL Cholesterol (Calc): 171 mg/dL (calc) — ABNORMAL HIGH (ref <130)
Total CHOL/HDL Ratio: 4.9 (calc) (ref <5.0)
Triglycerides: 227 mg/dL — ABNORMAL HIGH (ref <150)

## 2020-03-21 LAB — VITAMIN B12: Vitamin B-12: 613 pg/mL (ref 200–1100)

## 2020-03-21 LAB — TSH: TSH: 3.46 mIU/L (ref 0.40–4.50)

## 2020-03-21 MED ORDER — FUSION PLUS PO CAPS: 1.0000 | ORAL_CAPSULE | Freq: Every day | ORAL | 2 refills | Status: DC

## 2020-03-21 NOTE — Patient Instructions (Addendum)
Try moving omeprazole to night time, iron to daytime at least 4 hours apart If having any daytime breakthrough symptoms, can try famotidine (pepcid) over the counter as needed   Focus on black beans, chia seeds, flax seeds, whole grains/oats - for soluble fiber - naturally lower cholesterol  For oils, best to stick to olive oil, avocado oil (can handle higher temps - best for baking)  Check labels to limit saturated/trans fats        Hypothyroidism  Hypothyroidism is when the thyroid gland does not make enough of certain hormones (it is underactive). The thyroid gland is a small gland located in the lower front part of the neck, just in front of the windpipe (trachea). This gland makes hormones that help control how the body uses food for energy (metabolism) as well as how the heart and brain function. These hormones also play a role in keeping your bones strong. When the thyroid is underactive, it produces too little of the hormones thyroxine (T4) and triiodothyronine (T3). What are the causes? This condition may be caused by:  Hashimoto's disease. This is a disease in which the body's disease-fighting system (immune system) attacks the thyroid gland. This is the most common cause.  Viral infections.  Pregnancy.  Certain medicines.  Birth defects.  Past radiation treatments to the head or neck for cancer.  Past treatment with radioactive iodine.  Past exposure to radiation in the environment.  Past surgical removal of part or all of the thyroid.  Problems with a gland in the center of the brain (pituitary gland).  Lack of enough iodine in the diet. What increases the risk? You are more likely to develop this condition if:  You are female.  You have a family history of thyroid conditions.  You use a medicine called lithium.  You take medicines that affect the immune system (immunosuppressants). What are the signs or symptoms? Symptoms of this condition  include:  Feeling as though you have no energy (lethargy).  Not being able to tolerate cold.  Weight gain that is not explained by a change in diet or exercise habits.  Lack of appetite.  Dry skin.  Coarse hair.  Menstrual irregularity.  Slowing of thought processes.  Constipation.  Sadness or depression. How is this diagnosed? This condition may be diagnosed based on:  Your symptoms, your medical history, and a physical exam.  Blood tests. You may also have imaging tests, such as an ultrasound or MRI. How is this treated? This condition is treated with medicine that replaces the thyroid hormones that your body does not make. After you begin treatment, it may take several weeks for symptoms to go away. Follow these instructions at home:  Take over-the-counter and prescription medicines only as told by your health care provider.  If you start taking any new medicines, tell your health care provider.  Keep all follow-up visits as told by your health care provider. This is important. ? As your condition improves, your dosage of thyroid hormone medicine may change. ? You will need to have blood tests regularly so that your health care provider can monitor your condition. Contact a health care provider if:  Your symptoms do not get better with treatment.  You are taking thyroid replacement medicine and you: ? Sweat a lot. ? Have tremors. ? Feel anxious. ? Lose weight rapidly. ? Cannot tolerate heat. ? Have emotional swings. ? Have diarrhea. ? Feel weak. Get help right away if you have:  Chest pain.  An irregular heartbeat.  A rapid heartbeat.  Difficulty breathing. Summary  Hypothyroidism is when the thyroid gland does not make enough of certain hormones (it is underactive).  When the thyroid is underactive, it produces too little of the hormones thyroxine (T4) and triiodothyronine (T3).  The most common cause is Hashimoto's disease, a disease in which  the body's disease-fighting system (immune system) attacks the thyroid gland. The condition can also be caused by viral infections, medicine, pregnancy, or past radiation treatment to the head or neck.  Symptoms may include weight gain, dry skin, constipation, feeling as though you do not have energy, and not being able to tolerate cold.  This condition is treated with medicine to replace the thyroid hormones that your body does not make. This information is not intended to replace advice given to you by your health care provider. Make sure you discuss any questions you have with your health care provider. Document Revised: 05/27/2017 Document Reviewed: 05/25/2017 Elsevier Patient Education  2020 [employer].

## 2020-06-25 ENCOUNTER — Ambulatory Visit (INDEPENDENT_AMBULATORY_CARE_PROVIDER_SITE_OTHER): Payer: 59 | Admitting: Adult Health

## 2020-06-25 ENCOUNTER — Other Ambulatory Visit: Payer: Self-pay

## 2020-06-25 ENCOUNTER — Encounter: Payer: Self-pay | Admitting: Adult Health

## 2020-06-25 VITALS — BP 110/80 | HR 77 | Temp 96.9°F | Wt 135.4 lb

## 2020-06-25 DIAGNOSIS — E782 Mixed hyperlipidemia: Secondary | ICD-10-CM

## 2020-06-25 DIAGNOSIS — I872 Venous insufficiency (chronic) (peripheral): Secondary | ICD-10-CM | POA: Diagnosis not present

## 2020-06-25 DIAGNOSIS — D509 Iron deficiency anemia, unspecified: Secondary | ICD-10-CM

## 2020-06-25 DIAGNOSIS — R7309 Other abnormal glucose: Secondary | ICD-10-CM

## 2020-06-25 DIAGNOSIS — Z79899 Other long term (current) drug therapy: Secondary | ICD-10-CM | POA: Diagnosis not present

## 2020-06-25 DIAGNOSIS — K219 Gastro-esophageal reflux disease without esophagitis: Secondary | ICD-10-CM | POA: Diagnosis not present

## 2020-06-25 DIAGNOSIS — R7989 Other specified abnormal findings of blood chemistry: Secondary | ICD-10-CM

## 2020-06-25 DIAGNOSIS — F419 Anxiety disorder, unspecified: Secondary | ICD-10-CM

## 2020-06-25 DIAGNOSIS — E559 Vitamin D deficiency, unspecified: Secondary | ICD-10-CM

## 2020-06-25 DIAGNOSIS — K2100 Gastro-esophageal reflux disease with esophagitis, without bleeding: Secondary | ICD-10-CM

## 2020-06-25 MED ORDER — OMEPRAZOLE 40 MG PO CPDR: DELAYED_RELEASE_CAPSULE | ORAL | 3 refills | Status: DC

## 2020-06-25 NOTE — Patient Instructions (Addendum)
Goals    . LDL CALC < 130       Can try increasing soluble fiber in diet -  Citrucel/benfiber supplement  Reduce saturated/trans fat, cholesterol -     High-Fiber Diet Fiber, also called dietary fiber, is a type of carbohydrate that is found in fruits, vegetables, whole grains, and beans. A high-fiber diet can have many health benefits. Your health care provider may recommend a high-fiber diet to help:  Prevent constipation. Fiber can make your bowel movements more regular.  Lower your cholesterol.  Relieve the following conditions: ? Swelling of veins in the anus (hemorrhoids). ? Swelling and irritation (inflammation) of specific areas of the digestive tract (uncomplicated diverticulosis). ? A problem of the large intestine (colon) that sometimes causes pain and diarrhea (irritable bowel syndrome, IBS).  Prevent overeating as part of a weight-loss plan.  Prevent heart disease, type 2 diabetes, and certain cancers. What is my plan? The recommended daily fiber intake in grams (g) includes:  38 g for men age 76 or younger.  30 g for men over age 22.  25 g for women age 41 or younger.  21 g for women over age 80. You can get the recommended daily intake of dietary fiber by:  Eating a variety of fruits, vegetables, grains, and beans.  Taking a fiber supplement, if it is not possible to get enough fiber through your diet. What do I need to know about a high-fiber diet?  It is better to get fiber through food sources rather than from fiber supplements. There is not a lot of research about how effective supplements are.  Always check the fiber content on the nutrition facts label of any prepackaged food. Look for foods that contain 5 g of fiber or more per serving.  Talk with a diet and nutrition specialist (dietitian) if you have questions about specific foods that are recommended or not recommended for your medical condition, especially if those foods are not listed  below.  Gradually increase how much fiber you consume. If you increase your intake of dietary fiber too quickly, you may have bloating, cramping, or gas.  Drink plenty of water. Water helps you to digest fiber. What are tips for following this plan?  Eat a wide variety of high-fiber foods.  Make sure that half of the grains that you eat each day are whole grains.  Eat breads and cereals that are made with whole-grain flour instead of refined flour or white flour.  Eat brown rice, bulgur wheat, or millet instead of white rice.  Start the day with a breakfast that is high in fiber, such as a cereal that contains 5 g of fiber or more per serving.  Use beans in place of meat in soups, salads, and pasta dishes.  Eat high-fiber snacks, such as berries, raw vegetables, nuts, and popcorn.  Choose whole fruits and vegetables instead of processed forms like juice or sauce. What foods can I eat?  Fruits Berries. Pears. Apples. Oranges. Avocado. Prunes and raisins. Dried figs. Vegetables Sweet potatoes. Spinach. Kale. Artichokes. Cabbage. Broccoli. Cauliflower. Green peas. Carrots. Squash. Grains Whole-grain breads. Multigrain cereal. Oats and oatmeal. Brown rice. Barley. Bulgur wheat. Millet. Quinoa. Bran muffins. Popcorn. Rye wafer crackers. Meats and other proteins Navy, kidney, and pinto beans. Soybeans. Split peas. Lentils. Nuts and seeds. Dairy Fiber-fortified yogurt. Beverages Fiber-fortified soy milk. Fiber-fortified orange juice. Other foods Fiber bars. The items listed above may not be a complete list of recommended foods and beverages. Contact  a dietitian for more options. What foods are not recommended? Fruits Fruit juice. Cooked, strained fruit. Vegetables Fried potatoes. Canned vegetables. Well-cooked vegetables. Grains White bread. Pasta made with refined flour. White rice. Meats and other proteins Fatty cuts of meat. Fried chicken or fried fish. Dairy Milk.  Yogurt. Cream cheese. Sour cream. Fats and oils Butters. Beverages Soft drinks. Other foods Cakes and pastries. The items listed above may not be a complete list of foods and beverages to avoid. Contact a dietitian for more information. Summary  Fiber is a type of carbohydrate. It is found in fruits, vegetables, whole grains, and beans.  There are many health benefits of eating a high-fiber diet, such as preventing constipation, lowering blood cholesterol, helping with weight loss, and reducing your risk of heart disease, diabetes, and certain cancers.  Gradually increase your intake of fiber. Increasing too fast can result in cramping, bloating, and gas. Drink plenty of water while you increase your fiber.  The best sources of fiber include whole fruits and vegetables, whole grains, nuts, seeds, and beans. This information is not intended to replace advice given to you by your health care provider. Make sure you discuss any questions you have with your health care provider. Document Revised: 04/18/2017 Document Reviewed: 04/18/2017 Elsevier Patient Education  2020 [employer]     Coronary Calcium Scan A coronary calcium scan is an imaging test used to look for deposits of plaque in the inner lining of the blood vessels of the heart (coronary arteries). Plaque is made up of calcium, protein, and fatty substances. These deposits of plaque can partly clog and narrow the coronary arteries without producing any symptoms or warning signs. This puts a person at risk for a heart attack. This test is recommended for people who are at moderate risk for heart disease. The test can find plaque deposits before symptoms develop. Tell a health care provider about:  Any allergies you have.  All medicines you are taking, including vitamins, herbs, eye drops, creams, and over-the-counter medicines.  Any problems you or family members have had with anesthetic medicines.  Any blood disorders you  have.  Any surgeries you have had.  Any medical conditions you have.  Whether you are pregnant or may be pregnant. What are the risks? Generally, this is a safe procedure. However, problems may occur, including:  Harm to a pregnant woman and her unborn baby. This test involves the use of radiation. Radiation exposure can be dangerous to a pregnant woman and her unborn baby. If you are pregnant or think you may be pregnant, you should not have this procedure done.  Slight increase in the risk of cancer. This is because of the radiation involved in the test. What happens before the procedure? Ask your health care provider for any specific instructions on how to prepare for this procedure. You may be asked to avoid products that contain caffeine, tobacco, or nicotine for 4 hours before the procedure. What happens during the procedure?   You will undress and remove any jewelry from your neck or chest.  You will put on a hospital gown.  Sticky electrodes will be placed on your chest. The electrodes will be connected to an electrocardiogram (ECG) machine to record a tracing of the electrical activity of your heart.  You will lie down on a curved bed that is attached to the CT scanner.  You may be given medicine to slow down your heart rate so that clear pictures can be created.  You will be moved into the CT scanner, and the CT scanner will take pictures of your heart. During this time, you will be asked to lie still and hold your breath for 2-3 seconds at a time while each picture of your heart is being taken. The procedure may vary among health care providers and hospitals. What happens after the procedure?  You can get dressed.  You can return to your normal activities.  It is up to you to get the results of your procedure. Ask your health care provider, or the department that is doing the procedure, when your results will be ready. Summary  A coronary calcium scan is an imaging  test used to look for deposits of plaque in the inner lining of the blood vessels of the heart (coronary arteries). Plaque is made up of calcium, protein, and fatty substances.  Generally, this is a safe procedure. Tell your health care provider if you are pregnant or may be pregnant.  Ask your health care provider for any specific instructions on how to prepare for this procedure.  A CT scanner will take pictures of your heart.  You can return to your normal activities after the scan is done. This information is not intended to replace advice given to you by your health care provider. Make sure you discuss any questions you have with your health care provider. Document Revised: 01/02/2019 Document Reviewed: 01/02/2019 Elsevier Patient Education  2020 Elsevier Inc.     Ezetimibe Tablets What is this medicine? EZETIMIBE (ez ET i mibe) blocks the absorption of cholesterol from the stomach. It can help lower blood cholesterol for patients who are at risk of getting heart disease or a stroke. It is only for patients whose cholesterol level is not controlled by diet. This medicine may be used for other purposes; ask your health care provider or pharmacist if you have questions. COMMON BRAND NAME(S): Zetia What should I tell my health care provider before I take this medicine? They need to know if you have any of these conditions:  liver disease  an unusual or allergic reaction to ezetimibe, medicines, foods, dyes, or preservatives  pregnant or trying to get pregnant  breast-feeding How should I use this medicine? Take this medicine by mouth with a glass of water. Follow the directions on the prescription label. This medicine can be taken with or without food. Take your doses at regular intervals. Do not take your medicine more often than directed. Talk to your pediatrician regarding the use of this medicine in children. Special care may be needed. Overdosage: If you think you have taken  too much of this medicine contact a poison control center or emergency room at once. NOTE: This medicine is only for you. Do not share this medicine with others. What if I miss a dose? If you miss a dose, take it as soon as you can. If it is almost time for your next dose, take only that dose. Do not take double or extra doses. What may interact with this medicine? Do not take this medicine with any of the following medications:  fenofibrate  gemfibrozil This medicine may also interact with the following medications:  antacids  cyclosporine  herbal medicines like red yeast rice  other medicines to lower cholesterol or triglycerides This list may not describe all possible interactions. Give your health care provider a list of all the medicines, herbs, non-prescription drugs, or dietary supplements you use. Also tell them if you smoke, drink alcohol,  or use illegal drugs. Some items may interact with your medicine. What should I watch for while using this medicine? Visit your doctor or health care professional for regular checks on your progress. You will need to have your cholesterol levels checked. If you are also taking some other cholesterol medicines, you will also need to have tests to make sure your liver is working properly. Tell your doctor or health care professional if you get any unexplained muscle pain, tenderness, or weakness, especially if you also have a fever and tiredness. You need to follow a low-cholesterol, low-fat diet while you are taking this medicine. This will decrease your risk of getting heart and blood vessel disease. Exercising and avoiding alcohol and smoking can also help. Ask your doctor or dietician for advice. What side effects may I notice from receiving this medicine? Side effects that you should report to your doctor or health care professional as soon as possible:  allergic reactions like skin rash, itching or hives, swelling of the face, lips, or  tongue  dark yellow or brown urine  unusually weak or tired  yellowing of the skin or eyes Side effects that usually do not require medical attention (report to your doctor or health care professional if they continue or are bothersome):  diarrhea  dizziness  headache  stomach upset or pain This list may not describe all possible side effects. Call your doctor for medical advice about side effects. You may report side effects to FDA at 1-800-FDA-1088. Where should I keep my medicine? Keep out of the reach of children. Store at room temperature between 15 and 30 degrees C (59 and 86 degrees F). Protect from moisture. Keep container tightly closed. Throw away any unused medicine after the expiration date. NOTE: This sheet is a summary. It may not cover all possible information. If you have questions about this medicine, talk to your doctor, pharmacist, or health care provider.  2020 Elsevier/Gold Standard (2011-12-20 15:39:09)

## 2020-06-25 NOTE — Progress Notes (Signed)
FOLLOW UP  Assessment and Plan:   Cholesterol Currently with moderate elevations, does have maternal MI hx working on lifestyle, has reduced animal fats, increasing fiber intake Strong preference to avoid medication if possible, particularly statin Discussed zetia, CT coronary calcium; she would like information and will consider but hold off for now Continue low cholesterol diet and exercise.  Check lipid panel.   Hx of prediabetes Recent A1Cs at goal Discussed diet/exercise, weight management  Defer A1C; check CMP  GERD Well managed on current medications Discussed diet, avoiding triggers and other lifestyle changes  Elevated TSH Hx of recurrent mild elevations resolved;  Recheck TSH She is persistently symptomatic, consider trial of low dose synthroid if remains borderline  Vitamin D Def At goal at last visit; continue supplementation to maintain goal of 60-100 Defer Vit D level  Hx of recurrent iron def Likely r/t frequent donations;  Requesting iron recheck due to recent fatigue  Continue diet and meds as discussed. Further disposition pending results of labs. Discussed med's effects and SE's.   Over 30 minutes of exam, counseling, chart review, and critical decision making was performed.   Future Appointments  Date Time Provider Department Center  12/12/2020 10:00 AM Lucky Cowboy, MD [facility] None    ----------------------------------------------------------------------------------------------------------------------  HPI 64 y.o. female  presents for 3 month follow up on cholesterol, elevated TSH, anxiety, GERD, vitamin D deficiency.   She has hx of anxiety treated by sertraline 150 mg daily.   GERD recently taking omprazole 40 mg in AM, famotidine 20 mg at night and reports no longer having breakthrough sx at night.   BMI is Body mass index is 20.89 kg/m., she has been working on diet and exercise, very active in yard, watching her young grandson,  hits 8000-10000 steps on fitbit daily. Admits some plurging over the holidays.  Wt Readings from Last 3 Encounters:  06/25/20 135 lb 6.4 oz (61.4 kg)  03/21/20 137 lb 3.2 oz (62.2 kg)  12/03/19 142 lb 9.6 oz (64.7 kg)   Her blood pressure has been controlled at home, today their BP is BP: 110/80  She does not workout but generally active. She denies chest pain, shortness of breath, dizziness.   She is not on cholesterol medication. Low risk history, strong preference for lifestyle changes. Mom did have MI at age 74. Her cholesterol is not at goal. She did start fish oil since last visit. The cholesterol last visit was:   Lab Results  Component Value Date   CHOL 215 (H) 03/21/2020   HDL 44 (L) 03/21/2020   LDLCALC 134 (H) 03/21/2020   LDLDIRECT 130.8 01/08/2013   TRIG 227 (H) 03/21/2020   CHOLHDL 4.9 03/21/2020    She has been working on diet and exercise for hx of prediabetes, and denies increased appetite, nausea, paresthesia of the feet, polydipsia, polyuria and visual disturbances.  Last A1C in the office was:  Lab Results  Component Value Date   HGBA1C 5.3 12/03/2019   TSh was mildly elevated at previous check (6.16 11/2019); she endorses mild fatigue, thinning hair; denies weight gain, dry skin, constipation.  Lab Results  Component Value Date   TSH 3.46 03/21/2020    Patient is on Vitamin D supplement.   Lab Results  Component Value Date   VD25OH 69 12/03/2019     B12 was found to be borderline low, recommended for sublingual supplement Lab Results  Component Value Date   VITAMINB12 613 03/21/2020   She reports hx of intermittent mild  iron def, donates blood/plasma frequently, occasionally can't give due to being too low, taking iron pill twice daily.  Lab Results  Component Value Date   IRON 99 12/03/2019   TIBC 323 12/03/2019   FERRITIN 16.1 09/10/2015     Current Medications:  Current Outpatient Medications on File Prior to Visit  Medication Sig  .  Cholecalciferol (VITAMIN D) 125 MCG (5000 UT) CAPS Take 1 capsule by mouth daily.  . Cyanocobalamin (B-12) 500 MCG SUBL Place under the tongue daily.  . Iron-FA-B Cmp-C-Biot-Probiotic (FUSION PLUS) CAPS Take 1 capsule by mouth daily.  Marland Kitchen loratadine (CLARITIN) 10 MG tablet Take 10 mg by mouth daily.  Marland Kitchen omeprazole (PRILOSEC) 40 MG capsule Take 1 capsule Daily prevent Indigestion & Heartburn  . sertraline (ZOLOFT) 100 MG tablet Take 1 & 1/2 tablet  (150 mg)  Daily for Mood  . vitamin C (ASCORBIC ACID) 250 MG tablet Take 250 mg by mouth daily. 1 gummy daily.  . Zinc 50 MG CAPS Take by mouth daily.  . [DISCONTINUED] Omeprazole (PRILOSEC PO) 20 mg daily.    No current facility-administered medications on file prior to visit.     Allergies:  Allergies  Allergen Reactions  . Amoxil [Amoxicillin] Itching  . Penicillins Rash    ? Rash as child     Medical History:  Past Medical History:  Diagnosis Date  . Anxiety   . Diverticulitis 2016  . Fracture of fifth metacarpal bone of left hand 09/03/2011   post fall  . GERD (gastroesophageal reflux disease)   . Osteoarthritis    Family history- Reviewed and unchanged Social history- Reviewed and unchanged    Review of Systems:  Review of Systems  Constitutional: Positive for malaise/fatigue. Negative for weight loss.  HENT: Negative for hearing loss and tinnitus.   Eyes: Negative for blurred vision and double vision.  Respiratory: Negative for cough, shortness of breath and wheezing.   Cardiovascular: Negative for chest pain, palpitations, orthopnea, claudication and leg swelling.  Gastrointestinal: Negative for abdominal pain, blood in stool, constipation, diarrhea, heartburn, melena, nausea and vomiting.  Genitourinary: Negative.   Musculoskeletal: Negative for joint pain and myalgias.  Skin: Negative for rash.  Neurological: Negative for dizziness, tingling, sensory change, weakness and headaches.  Endo/Heme/Allergies: Negative for  polydipsia.  Psychiatric/Behavioral: Negative.   All other systems reviewed and are negative.   Physical Exam: BP 110/80   Pulse 77   Temp (!) 96.9 F (36.1 C)   Wt 135 lb 6.4 oz (61.4 kg)   SpO2 99%   BMI 20.89 kg/m  Wt Readings from Last 3 Encounters:  06/25/20 135 lb 6.4 oz (61.4 kg)  03/21/20 137 lb 3.2 oz (62.2 kg)  12/03/19 142 lb 9.6 oz (64.7 kg)   General Appearance: Well nourished, in no apparent distress. Eyes: PERRLA, EOMs, conjunctiva no swelling or erythema Sinuses: No Frontal/maxillary tenderness ENT/Mouth: Ext aud canals clear, TMs without erythema, bulging. No erythema, swelling, or exudate on post pharynx.  Tonsils not swollen or erythematous. Hearing normal.  Neck: Supple, thyroid normal.  Respiratory: Respiratory effort normal, BS equal bilaterally without rales, rhonchi, wheezing or stridor.  Cardio: RRR with no MRGs. Brisk peripheral pulses without edema.  Abdomen: Soft, + BS.  Non tender, no guarding, rebound, hernias, masses. Lymphatics: Non tender without lymphadenopathy.  Musculoskeletal: Full ROM, 5/5 strength, Normal gait Skin: Warm, dry without rashes, lesions, ecchymosis.  Neuro: Cranial nerves intact. No cerebellar symptoms.  Psych: Awake and oriented X 3, normal affect, Insight and Judgment  appropriate.    Dan Maker, NP 2:31 PM [facility]

## 2020-06-26 LAB — COMPLETE METABOLIC PANEL WITH GFR
AG Ratio: 1.9 (calc) (ref 1.0–2.5)
ALT: 11 U/L (ref 6–29)
AST: 19 U/L (ref 10–35)
Albumin: 4.4 g/dL (ref 3.6–5.1)
Alkaline phosphatase (APISO): 72 U/L (ref 37–153)
BUN: 15 mg/dL (ref 7–25)
CO2: 30 mmol/L (ref 20–32)
Calcium: 9.6 mg/dL (ref 8.6–10.4)
Chloride: 103 mmol/L (ref 98–110)
Creat: 0.71 mg/dL (ref 0.50–0.99)
GFR, Est African American: 106 mL/min/{1.73_m2} (ref >=60)
GFR, Est Non African American: 91 mL/min/{1.73_m2} (ref >=60)
Globulin: 2.3 g/dL (calc) (ref 1.9–3.7)
Glucose, Bld: 89 mg/dL (ref 65–99)
Potassium: 4.8 mmol/L (ref 3.5–5.3)
Sodium: 139 mmol/L (ref 135–146)
Total Bilirubin: 0.4 mg/dL (ref 0.2–1.2)
Total Protein: 6.7 g/dL (ref 6.1–8.1)

## 2020-06-26 LAB — CBC WITH DIFFERENTIAL/PLATELET
Absolute Monocytes: 308 cells/uL (ref 200–950)
Basophils Absolute: 0 cells/uL (ref 0–200)
Basophils Relative: 0 %
Eosinophils Absolute: 32 cells/uL (ref 15–500)
Eosinophils Relative: 0.6 %
HCT: 36.3 % (ref 35.0–45.0)
Hemoglobin: 12.3 g/dL (ref 11.7–15.5)
Lymphs Abs: 1134 cells/uL (ref 850–3900)
MCH: 30.8 pg (ref 27.0–33.0)
MCHC: 33.9 g/dL (ref 32.0–36.0)
MCV: 90.8 fL (ref 80.0–100.0)
MPV: 12.3 fL (ref 7.5–12.5)
Monocytes Relative: 5.7 %
Neutro Abs: 3926 cells/uL (ref 1500–7800)
Neutrophils Relative %: 72.7 %
Platelets: 203 10*3/uL (ref 140–400)
RBC: 4 10*6/uL (ref 3.80–5.10)
RDW: 12.5 % (ref 11.0–15.0)
Total Lymphocyte: 21 %
WBC: 5.4 10*3/uL (ref 3.8–10.8)

## 2020-06-26 LAB — MAGNESIUM: Magnesium: 2.3 mg/dL (ref 1.5–2.5)

## 2020-06-26 LAB — TSH: TSH: 3.51 mIU/L (ref 0.40–4.50)

## 2020-06-26 LAB — LIPID PANEL
Cholesterol: 219 mg/dL — ABNORMAL HIGH (ref <200)
HDL: 49 mg/dL — ABNORMAL LOW (ref >=50)
LDL Cholesterol (Calc): 141 mg/dL (calc) — ABNORMAL HIGH
Non-HDL Cholesterol (Calc): 170 mg/dL (calc) — ABNORMAL HIGH (ref <130)
Total CHOL/HDL Ratio: 4.5 (calc) (ref <5.0)
Triglycerides: 155 mg/dL — ABNORMAL HIGH (ref <150)

## 2020-06-26 LAB — IRON,TIBC AND FERRITIN PANEL
%SAT: 30 % (calc) (ref 16–45)
Ferritin: 51 ng/mL (ref 16–288)
Iron: 100 ug/dL (ref 45–160)
TIBC: 328 mcg/dL (calc) (ref 250–450)

## 2020-07-03 ENCOUNTER — Encounter: Payer: Self-pay | Admitting: Adult Health Nurse Practitioner

## 2020-07-03 ENCOUNTER — Other Ambulatory Visit: Payer: Self-pay

## 2020-07-03 ENCOUNTER — Ambulatory Visit (INDEPENDENT_AMBULATORY_CARE_PROVIDER_SITE_OTHER): Payer: 59 | Admitting: Adult Health Nurse Practitioner

## 2020-07-03 VITALS — BP 118/72 | HR 83 | Temp 98.7°F | Wt 137.0 lb

## 2020-07-03 DIAGNOSIS — M545 Low back pain, unspecified: Secondary | ICD-10-CM

## 2020-07-03 DIAGNOSIS — M542 Cervicalgia: Secondary | ICD-10-CM

## 2020-07-03 DIAGNOSIS — K2100 Gastro-esophageal reflux disease with esophagitis, without bleeding: Secondary | ICD-10-CM

## 2020-07-03 MED ORDER — MELOXICAM 15 MG PO TABS: ORAL_TABLET | ORAL | 1 refills | Status: DC
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
## 2020-08-19 ENCOUNTER — Other Ambulatory Visit: Payer: Self-pay

## 2020-08-19 ENCOUNTER — Ambulatory Visit (INDEPENDENT_AMBULATORY_CARE_PROVIDER_SITE_OTHER): Payer: 59 | Admitting: Internal Medicine

## 2020-08-19 VITALS — BP 108/80 | HR 73 | Temp 97.2°F | Resp 16 | Ht 67.5 in | Wt 136.6 lb

## 2020-08-19 DIAGNOSIS — S161XXD Strain of muscle, fascia and tendon at neck level, subsequent encounter: Secondary | ICD-10-CM | POA: Diagnosis not present

## 2020-08-19 MED ORDER — DEXAMETHASONE 4 MG PO TABS: ORAL_TABLET | ORAL | 0 refills | Status: DC

## 2020-08-19 MED ORDER — CYCLOBENZAPRINE HCL 10 MG PO TABS: ORAL_TABLET | ORAL | 0 refills | Status: DC

## 2020-08-19 NOTE — Patient Instructions (Signed)
Cervical Sprain  A cervical sprain is a stretch or tear in one or more of the ligaments in the neck. Ligaments are the tissues that connect bones. Cervical sprains can range from mild to severe. Severe cervical sprains can cause the spinal bones (vertebrae) in the neck to be unstable. This can result in spinal cord damage and in serious nervous system problems. The time that it takes for a cervical sprain to heal depends on the cause and extent of the injury. Most cervical sprains heal in 4-6 weeks. What are the causes? Cervical sprains may be caused by trauma, such as an injury from a motor vehicle accident, a fall, or a sudden forward and backward whipping movement of the head and neck (whiplash injury). Mild cervical sprains may be caused by wear and tear over time. What increases the risk? The following factors may make you more likely to develop this condition:  Participating in activities that have a high risk of trauma to the neck. These include contact sports, auto racing, gymnastics, and diving.  Taking risks when driving or riding in a motor vehicle.  Osteoarthritis of the spine.  Poor strength and flexibility of the neck.  A previous neck injury.  Poor posture.  Spending long periods in certain positions that put stress on the neck, such as sitting at a computer for a long time. What are the signs or symptoms? Symptoms of this condition include:  Pain, soreness, stiffness, tenderness, swelling, or a burning sensation in the front, back, or sides of the neck, shoulders, or upper back.  Sudden tightening of neck muscles (spasms).  Limited ability to move the neck.  Headache.  Dizziness.  Nausea or vomiting.  Weakness, numbness, or tingling in a hand or an arm. Symptoms may develop right away after injury, or they may develop over a few days. In some cases, symptoms may go away with treatment and return (recur) over time. How is this diagnosed? This condition may be  diagnosed based on:  Your medical history.  Your symptoms.  Any recent injuries or known neck problems that you have, such as arthritis in the neck.  A physical exam.  Imaging tests, such as X-rays, MRI, and CT scan. How is this treated? This condition is treated by resting and icing the injured area and doing physical therapy exercises. Heat therapy may be used 2-3 days after the injury occurred if there is no swelling. Depending on the severity of your condition, treatment may also include:  Keeping your neck in place (immobilized) for periods of time. This may be done using: ? A cervical collar. This supports your chin and the back of your head. ? A cervical traction device. This is a sling that holds up your head. The device removes weight and pressure from your neck, and it may help to relieve pain.  Medicines that help to relieve pain and inflammation.  Medicines that help to relax your muscles (muscle relaxants).  Surgery. This is rare.   Follow these instructions at home: Medicines  Take over-the-counter and prescription medicines only as told by your health care provider.  Ask your health care provider if the medicine prescribed to you: ? Requires you to avoid driving or using heavy machinery. ? Can cause constipation. You may need to take these actions to prevent or treat constipation:  Drink enough fluid to keep your urine pale yellow.  Take over-the-counter or prescription medicines.  Eat foods that are high in fiber, such as beans, whole grains,  and fresh fruits and vegetables.  Limit foods that are high in fat and processed sugars, such as fried or sweet foods.  If you have a cervical collar:  Wear the collar as told by your health care provider. Do not remove it unless told.  Ask before making any adjustments to your collar.  If you have long hair, keep it outside of the collar.  Ask your health care provider if you may remove the collar for cleaning  and bathing. If so: ? Follow instructions about how to remove it safely. ? Clean it by hand with mild soap and water and air-dry it completely. ? If your collar has removable pads, remove them every 1-2 days and wash them by hand with soap and water. Let them air-dry completely before putting them back in the collar.  Tell your health care provider if your skin under the collar has irritation or sores.   Managing pain, stiffness, and swelling  If directed, use a cervical traction device as told.  If directed, put ice on the affected area. To do this: ? Put ice in a plastic bag. ? Place a towel between your skin and the bag. ? Leave the ice on for 20 minutes, 2-3 times a day.  If directed, apply heat to the affected area before you do your physical therapy or as often as told by your health care provider. Use the heat source that your health care provider recommends, such as a moist heat pack or a heating pad. ? Place a towel between your skin and the heat source. ? Leave the heat on for 20-30 minutes. ? Remove the heat if your skin turns bright red. This is especially important if you are unable to feel pain, heat, or cold. You may have a greater risk of getting burned.  Activity  Do not drive while wearing a cervical collar. If you do not have a cervical collar, ask if it is safe to drive while your neck heals.  Do not lift anything that is heavier than 10 lb (4.5 kg), or the limit that you are told, until your health care provider says that it is safe.  Rest as told by your health care provider.  If physical therapy was prescribed, do exercises as told by your health care provider or physical therapist.  Return to your normal activities as told by your health care provider. Avoid positions and activities that make your symptoms worse. Ask your health care provider what activities are safe for you. General instructions  Do not use any products that contain nicotine or tobacco, such  as cigarettes, e-cigarettes, and chewing tobacco. These can delay healing. If you need help quitting, ask your health care provider.  Keep all follow-up visits as told by your health care provider or physical therapist. This is important. How is this prevented? To prevent a cervical sprain from happening again:  Use and maintain good posture. Make any needed adjustments to your workstation to help you do this.  Exercise regularly as told by your health care provider or physical therapist.  Avoid risky activities that may cause a cervical sprain. Contact a health care provider if you have:  Symptoms that get worse or do not get better after 2 weeks of treatment.  Pain that gets worse or does not get better with medicine.  New, unexplained symptoms.  Sores or irritated skin on your neck from wearing your cervical collar. Get help right away if:  You have severe pain.  You develop numbness, tingling, or weakness in any part of your body.  You cannot move a part of your body (you have paralysis).  You have neck pain along with severe dizziness or headache. Summary  A cervical sprain is a stretch or tear in one or more of the ligaments in the neck.  Cervical sprains may be caused by trauma, such as an injury from a motor vehicle accident, a fall, or a sudden forward and backward whipping movement of the head and neck (whiplash injury).  Symptoms may develop right away after injury, or they may develop over a few days.  This condition may be treated with rest, ice, heat, medicines, physical therapy, and surgery. =============================   Cervical Strain and Sprain Rehab  Ask your health care provider which exercises are safe for you. Do exercises exactly as told by your health care provider and adjust them as directed. It is normal to feel mild stretching, pulling, tightness, or discomfort as you do these exercises. Stop right away if you feel sudden pain or your pain gets  worse. Do not begin these exercises until told by your health care provider. Stretching and range-of-motion exercises  Cervical side bending 1. Using good posture, sit on a stable chair or stand up. 2. Without moving your shoulders, slowly tilt your left / right ear to your shoulder until you feel a stretch in the opposite side neck muscles. You should be looking straight ahead. 3. Hold for __________ seconds. 4. Repeat with the other side of your neck. Repeat __________ times. Complete this exercise __________ times  a day.     Cervical rotation 1. Using good posture, sit on a stable chair or stand up. 2. Slowly turn your head to the side as if you are looking over your left / right shoulder. ? Keep your eyes level with the ground. ? Stop when you feel a stretch along the side and the back of your neck. 3. Hold for __________ seconds. 4. Repeat this by turning to your other side. Repeat __________ times. Complete this exercise __________ times a day.    Thoracic extension and pectoral stretch 1. Roll a towel or a small blanket so it is about 4 inches (10 cm) in diameter. 2. Lie down on your back on a firm surface. 3. Put the towel lengthwise, under your spine in the middle of your back. It should not be under your shoulder blades. The towel should line up with your spine from your middle back to your lower back. 4. Put your hands behind your head and let your elbows fall out to your sides. 5. Hold for __________ seconds. Repeat __________ times. Complete this exercise __________ times a day.  Strengthening exercises  Isometric upper cervical flexion 1. Lie on your back with a thin pillow behind your head and a small rolled-up towel under your neck. 2. Gently tuck your chin toward your chest and nod your head down to look toward your feet. Do not lift your head off the pillow. 3. Hold for __________ seconds. 4. Release the tension slowly. Relax your neck muscles completely before  you repeat this exercise. Repeat __________ times. Complete this exercise __________ times a day.   Isometric cervical extension 1. Stand about 6 inches (15 cm) away from a wall, with your back facing the wall. 2. Place a soft object, about 6-8 inches (15-20 cm) in diameter, between the back of your head and the wall. A soft object could be a small pillow,  a ball, or a folded towel. 3. Gently tilt your head back and press into the soft object. Keep your jaw and forehead relaxed. 4. Hold for __________ seconds. 5. Release the tension slowly. Relax your neck muscles completely before you repeat this exercise. Repeat __________ times. Complete this exercise __________ times a day.    Posture and body mechanics Body mechanics refers to the movements and positions of your body while you do your daily activities. Posture is part of body mechanics. Good posture and healthy body mechanics can help to relieve stress in your body's tissues and joints. Good posture means that your spine is in its natural S-curve position (your spine is neutral), your shoulders are pulled back slightly, and your head is not tipped forward. The following are general guidelines for applying improved posture and body mechanics to your everyday activities.   Sitting 1. When sitting, keep your spine neutral and keep your feet flat on the floor. Use a footrest, if necessary, and keep your thighs parallel to the floor. Avoid rounding your shoulders, and avoid tilting your head forward. 2. When working at a desk or a computer, keep your desk at a height where your hands are slightly lower than your elbows. Slide your chair under your desk so you are close enough to maintain good posture. 3. When working at a computer, place your monitor at a height where you are looking straight ahead and you do not have to tilt your head forward or downward to look at the screen.     Standing  When standing, keep your spine neutral and keep  your feet about hip-width apart. Keep a slight bend in your knees. Your ears, shoulders, and hips should line up.  When you do a task in which you stand in one place for a long time, place one foot up on a stable object that is 2-4 inches (5-10 cm) high, such as a footstool. This helps keep your spine neutral.     Resting When lying down and resting, avoid positions that are most painful for you. Try to support your neck in a neutral position. You can use a contour pillow or a small rolled-up towel. Your pillow should support your neck but not push on it. ===================================== ===================================== =====================================  1st take the Steroid  (Dexamethasone) for 10 days,                                  then resume the Meloxicam   - Use the muscle relaxer as needed,                                         but especially at Bedtime   ===================================== ===================================== ====================================]=

## 2020-08-27 ENCOUNTER — Other Ambulatory Visit: Payer: Self-pay | Admitting: Internal Medicine

## 2020-08-27 MED ORDER — FLUCONAZOLE 150 MG PO TABS: ORAL_TABLET | ORAL | 0 refills | Status: DC

## 2020-09-05 LAB — HM MAMMOGRAPHY

## 2020-09-19 ENCOUNTER — Ambulatory Visit: Payer: 59 | Admitting: Internal Medicine
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
## 2020-10-20 ENCOUNTER — Ambulatory Visit (INDEPENDENT_AMBULATORY_CARE_PROVIDER_SITE_OTHER): Payer: 59 | Admitting: Internal Medicine

## 2020-10-20 ENCOUNTER — Other Ambulatory Visit: Payer: Self-pay

## 2020-10-20 ENCOUNTER — Encounter: Payer: Self-pay | Admitting: Internal Medicine

## 2020-10-20 VITALS — BP 110/70 | HR 105 | Temp 97.3°F | Resp 16 | Ht 67.35 in | Wt 140.2 lb

## 2020-10-20 DIAGNOSIS — S161XXD Strain of muscle, fascia and tendon at neck level, subsequent encounter: Secondary | ICD-10-CM

## 2020-10-20 DIAGNOSIS — M542 Cervicalgia: Secondary | ICD-10-CM

## 2020-11-11 ENCOUNTER — Other Ambulatory Visit: Payer: Self-pay

## 2020-11-11 DIAGNOSIS — K2100 Gastro-esophageal reflux disease with esophagitis, without bleeding: Secondary | ICD-10-CM

## 2020-11-11 MED ORDER — OMEPRAZOLE 40 MG PO CPDR: DELAYED_RELEASE_CAPSULE | ORAL | 3 refills | Status: DC

## 2020-12-11 NOTE — Progress Notes (Signed)
Annual Screening/Preventative Visit & Comprehensive Evaluation &  Examination  Future Appointments  Date Time Provider Department Center  12/12/2020 10:00 AM Lucky Cowboy, MD [facility] None  12/14/2021 10:00 AM Lucky Cowboy, MD [facility] None        This very nice 65 y.o. MWF presents for a Screening /Preventative Visit & comprehensive evaluation and management of multiple medical co-morbidities.  Patient has been followed for BP monitoring,  HLD, Prediabetes  and Vitamin D Deficiency. Patient's GERD is controled on her meds.        Patient is monitored proactively for elevated BP.  Patient's BP has been controlled at home and patient denies any cardiac symptoms as chest pain, palpitations, shortness of breath, dizziness or ankle swelling. Today's BP is at goal - 113/79.        Patient's hyperlipidemia is not controlled with diet. Last lipids were not at goal:  Lab Results  Component Value Date   CHOL 219 (H) 06/25/2020   HDL 49 (L) 06/25/2020   LDLCALC 141 (H) 06/25/2020   LDLDIRECT 130.8 01/08/2013   TRIG 155 (H) 06/25/2020   CHOLHDL 4.5 06/25/2020         Patient has hx/o prediabetes (A1c 5.8%  /2019) and patient denies reactive hypoglycemic symptoms, visual blurring, diabetic polys or paresthesias. Last A1c was normal & at goal:  Lab Results  Component Value Date   HGBA1C 5.3 12/03/2019         Last June,   patient had a low level of Vitamin B12 at "396".  Finally, patient has history of Vitamin D Deficiency and last Vitamin D  on 5,000 units daily replacement was:  Lab Results  Component Value Date   VD25OH 69 12/03/2019     Current Outpatient Medications on File Prior to Visit  Medication Sig   VITAMIN D 5000  u Take 1 capsule  daily.   Vitamin B-12 500 MCG SUBL Place under the tongue daily.   cyclobenzaprine 10 MG tablet Take  1/2 to 1 tablet  3 x /day  as needed    famotidine (PEPCID) 20 MG OTC  Take 20 mg daily.    loratadine (CLARITIN) 10 MG  tablet Take 10 mg  daily.   meloxicam (MOBIC) 15 MG tablet Take one daily as needed  for pain   Omeprazole 40 MG capsule Take 1 capsule Daily    sertraline 100 MG tablet Take 1 & 1/2 tablet  (150 mg)  Daily    vitamin C  250 MG tablet Take 1 gummy daily.   Zinc 50 MG CAPS Take by mouth daily.     Allergies  Allergen Reactions   Amoxil [Amoxicillin] Itching   Penicillins Rash    ? Rash as child    Past Medical History:  Diagnosis Date   Anxiety    Diverticulitis 2016   Fracture of fifth metacarpal bone of left hand 09/03/2011   post fall   GERD (gastroesophageal reflux disease)    Osteoarthritis     Health Maintenance  Topic Date Due   Pneumococcal Vaccine 8-3 Years old (1 - PCV) Never done   Zoster Vaccines- Shingrix (1 of 2) Never done   MAMMOGRAM  07/09/2019   PAP SMEAR-Modifier  07/13/2020   Fecal DNA (Cologuard)  07/15/2020   INFLUENZA VACCINE  01/26/2021   TETANUS/TDAP  04/05/2027   Hepatitis C Screening  Completed   HIV Screening  Completed   HPV VACCINES  Aged [employer] History  Administered Date(s) Administered   Influenza,inj,Quad PF,6+ Mos 04/04/2017   Influenza-Unspecified 04/06/2013, 04/28/2018   PPD Test 12/03/2019   Td 10/03/2006   Tdap 04/04/2017     Last Colon -  Dec 2020 - at [facility] - had a negative  EGD, Capsule Endoscopy & Colonoscopy to evaluate for Iron deficiency (w/u was negative and Low iron most likely due to her Proilosec)   Last MGM and dexaBMD - 06/2019 at [facility]   Past Surgical History:  Procedure Laterality Date   APPENDECTOMY     COLONOSCOPY     negative; Dr Jarold Motto   ORIF FINGER FRACTURE  09/03/2011   Procedure: OPEN REDUCTION INTERNAL FIXATION (ORIF) METACARPAL (FINGER) FRACTURE;  Surgeon: Eulas Post, MD;  Location: [facility];  Service: Orthopedics;  Laterality: Left;   TUBAL LIGATION       Family History  Problem Relation Age of Onset   Colitis Mother     COPD Mother    Heart attack Mother 86   Heart disease Maternal Grandfather    Breast cancer Other        MGaunt   Thyroid disease Sister    Diabetes Neg Hx    Stroke Neg Hx    Colon cancer Neg Hx    Colon polyps Neg Hx    Kidney disease Neg Hx    Gallbladder disease Neg Hx    Esophageal cancer Neg Hx     Social History   Tobacco Use   Smoking status: Never   Smokeless tobacco: Never  Substance Use Topics   Alcohol use: No    Alcohol/week: 0.0 standard drinks   Drug use: No      ROS  Constitutional: Denies fever, chills, weight loss/gain, headaches, insomnia,  night sweats, and change in appetite. Does c/o fatigue. Eyes: Denies redness, blurred vision, diplopia, discharge, itchy, watery eyes.  ENT: Denies discharge, congestion, post nasal drip, epistaxis, sore throat, earache, hearing loss, dental pain, Tinnitus, Vertigo, Sinus pain, snoring.  Cardio: Denies chest pain, palpitations, irregular heartbeat, syncope, dyspnea, diaphoresis, orthopnea, PND, claudication, edema Respiratory: denies cough, dyspnea, DOE, pleurisy, hoarseness, laryngitis, wheezing.  Gastrointestinal: Denies dysphagia, heartburn, reflux, water brash, pain, cramps, nausea, vomiting, bloating, diarrhea, constipation, hematemesis, melena, hematochezia, jaundice, hemorrhoids Genitourinary: Denies dysuria, frequency, urgency, nocturia, hesitancy, discharge, hematuria, flank pain Breast: Breast lumps, nipple discharge, bleeding.  Musculoskeletal: Denies arthralgia, myalgia, stiffness, Jt. Swelling, pain, limp, and strain/sprain. Denies falls. Skin: Denies puritis, rash, hives, warts, acne, eczema, changing in skin lesion Neuro: No weakness, tremor, incoordination, spasms, paresthesia, pain Psychiatric: Denies confusion, memory loss, sensory loss. Denies Depression. Endocrine: Denies change in weight, skin, hair change, nocturia, and paresthesia, diabetic polys, visual blurring, hyper / hypo glycemic episodes.   Heme/Lymph: No excessive bleeding, bruising, enlarged lymph nodes.  Physical Exam  BP 113/79   Pulse 81   Temp (!) 96.9 F (36.1 C)   Resp 17   Ht 5\' 7"  (1.702 m)   Wt 139 lb 3.2 oz (63.1 kg)   SpO2 98%   BMI 21.80 kg/m   General Appearance: Well nourished, well groomed and in no apparent distress.  Eyes: PERRLA, EOMs, conjunctiva no swelling or erythema, normal fundi and vessels. Sinuses: No frontal/maxillary tenderness ENT/Mouth: EACs patent / TMs  nl. Nares clear without erythema, swelling, mucoid exudates. Oral hygiene is good. No erythema, swelling, or exudate. Tongue normal, non-obstructing. Tonsils not swollen or erythematous. Hearing normal.  Neck: Supple, thyroid not palpable. No bruits, nodes or  JVD. Respiratory: Respiratory effort normal.  BS equal and clear bilateral without rales, rhonci, wheezing or stridor. Cardio: Heart sounds are normal with regular rate and rhythm and no murmurs, rubs or gallops. Peripheral pulses are normal and equal bilaterally without edema. No aortic or femoral bruits. Chest: symmetric with normal excursions and percussion. Breasts: Symmetric, without lumps, nipple discharge, retractions, or fibrocystic changes.  Abdomen: Flat, soft with bowel sounds active. Nontender, no guarding, rebound, hernias, masses, or organomegaly.  Lymphatics: Non tender without lymphadenopathy.  Genitourinary:  Musculoskeletal: Full ROM all peripheral extremities, joint stability, 5/5 strength, and normal gait. Skin: Warm and dry without rashes, lesions, cyanosis, clubbing or  ecchymosis.  Neuro: Cranial nerves intact, reflexes equal bilaterally. Normal muscle tone, no cerebellar symptoms. Sensation intact.  Pysch: Alert and oriented X 3, normal affect, Insight and Judgment appropriate.    Assessment and Plan  1. Annual Preventative Screening Examination   2. Elevated BP without diagnosis of hypertension  - EKG 12-Lead - [place], RETROPERITNL ABD,  LTD -  Urinalysis, Routine w reflex microscopic - Microalbumin / creatinine urine ratio - CBC with Differential/Platelet - COMPLETE METABOLIC PANEL WITH GFR - Magnesium - TSH  3. Hyperlipidemia, mixed  - EKG 12-Lead - [place], RETROPERITNL ABD,  LTD - Lipid panel - TSH  4. Abnormal glucose  - EKG 12-Lead - [place], RETROPERITNL ABD,  LTD - Hemoglobin A1c - Insulin, random  5. Vitamin D deficiency  - VITAMIN D 25 Hydroxy  6. Hypothyroidism, unspecified type  - TSH  7. Gastroesophageal reflux disease without esophagitis  - CBC with Differential/Platelet  8. Iron deficiency  - Iron, Total/Total Iron Binding Cap  9. B12 deficiency  - Vitamin B12  10. Screening for colorectal cancer  - POC Hemoccult Bld/Stl  11. Screening for ischemic heart disease  - EKG 12-Lead  12. FHx: heart disease  - EKG 12-Lead - [place], RETROPERITNL ABD,  LTD  13. Screening for AAA (aortic abdominal aneurysm)  - [place], RETROPERITNL ABD,  LTD  14. Fatigue, unspecified type  - Iron, Total/Total Iron Binding Cap - Vitamin B12 - CBC with Differential/Platelet - TSH  15. Medication management  - Urinalysis, Routine w reflex microscopic - Microalbumin / creatinine urine ratio - CBC with Differential/Platelet - COMPLETE METABOLIC PANEL WITH GFR - Magnesium - Lipid panel - TSH - Hemoglobin A1c - Insulin, random - VITAMIN D 25 Hydroxy   16. Screening-pulmonary TB  - TB Skin Test             Patient was counseled in prudent diet to achieve/maintain BMI less than 25 for weight control, BP monitoring, regular exercise and medications. Discussed med's effects and SE's. Screening labs and tests as requested with regular follow-up as recommended. Over 40 minutes of exam, counseling, chart review and high complex critical decision making was performed.   Marinus Maw, MD

## 2020-12-12 ENCOUNTER — Ambulatory Visit (INDEPENDENT_AMBULATORY_CARE_PROVIDER_SITE_OTHER): Payer: 59 | Admitting: Internal Medicine

## 2020-12-12 ENCOUNTER — Encounter: Payer: Self-pay | Admitting: Internal Medicine

## 2020-12-12 ENCOUNTER — Other Ambulatory Visit: Payer: Self-pay

## 2020-12-12 VITALS — BP 113/79 | HR 81 | Temp 96.9°F | Resp 17 | Ht 67.0 in | Wt 139.2 lb

## 2020-12-12 DIAGNOSIS — R5383 Other fatigue: Secondary | ICD-10-CM

## 2020-12-12 DIAGNOSIS — K219 Gastro-esophageal reflux disease without esophagitis: Secondary | ICD-10-CM

## 2020-12-12 DIAGNOSIS — Z79899 Other long term (current) drug therapy: Secondary | ICD-10-CM | POA: Diagnosis not present

## 2020-12-12 DIAGNOSIS — E538 Deficiency of other specified B group vitamins: Secondary | ICD-10-CM

## 2020-12-12 DIAGNOSIS — R7309 Other abnormal glucose: Secondary | ICD-10-CM

## 2020-12-12 DIAGNOSIS — E039 Hypothyroidism, unspecified: Secondary | ICD-10-CM

## 2020-12-12 DIAGNOSIS — Z1389 Encounter for screening for other disorder: Secondary | ICD-10-CM

## 2020-12-12 DIAGNOSIS — Z8249 Family history of ischemic heart disease and other diseases of the circulatory system: Secondary | ICD-10-CM | POA: Diagnosis not present

## 2020-12-12 DIAGNOSIS — Z130 Encounter for screening for diseases of the blood and blood-forming organs and certain disorders involving the immune mechanism: Secondary | ICD-10-CM

## 2020-12-12 DIAGNOSIS — Z1212 Encounter for screening for malignant neoplasm of rectum: Secondary | ICD-10-CM

## 2020-12-12 DIAGNOSIS — E611 Iron deficiency: Secondary | ICD-10-CM | POA: Insufficient documentation

## 2020-12-12 DIAGNOSIS — Z0000 Encounter for general adult medical examination without abnormal findings: Secondary | ICD-10-CM | POA: Diagnosis not present

## 2020-12-12 DIAGNOSIS — Z111 Encounter for screening for respiratory tuberculosis: Secondary | ICD-10-CM

## 2020-12-12 DIAGNOSIS — Z13220 Encounter for screening for lipoid disorders: Secondary | ICD-10-CM

## 2020-12-12 DIAGNOSIS — Z131 Encounter for screening for diabetes mellitus: Secondary | ICD-10-CM | POA: Diagnosis not present

## 2020-12-12 DIAGNOSIS — R030 Elevated blood-pressure reading, without diagnosis of hypertension: Secondary | ICD-10-CM | POA: Diagnosis not present

## 2020-12-12 DIAGNOSIS — E782 Mixed hyperlipidemia: Secondary | ICD-10-CM

## 2020-12-12 DIAGNOSIS — Z136 Encounter for screening for cardiovascular disorders: Secondary | ICD-10-CM | POA: Diagnosis not present

## 2020-12-12 DIAGNOSIS — E559 Vitamin D deficiency, unspecified: Secondary | ICD-10-CM | POA: Diagnosis not present

## 2020-12-12 DIAGNOSIS — Z0001 Encounter for general adult medical examination with abnormal findings: Secondary | ICD-10-CM

## 2020-12-12 DIAGNOSIS — Z1211 Encounter for screening for malignant neoplasm of colon: Secondary | ICD-10-CM

## 2020-12-12 NOTE — Patient Instructions (Signed)
Due to recent changes in healthcare laws, you may see the results of your imaging and laboratory studies on MyChart before your provider has had a chance to review them.  We understand that in some cases there may be results that are confusing or concerning to you. Not all laboratory results come back in the same time frame and the provider may be waiting for multiple results in order to interpret others.  Please give [place] 48 hours in order for your provider to thoroughly review all the results before contacting the office for clarification of your results.   ++++++++++++++++++++++++++++++  Vit D  & Vit C 1,000 mg   are recommended to help protect  against the Covid-19 and other Corona viruses.    Also it's recommended  to take  Zinc 50 mg  to help  protect against the Covid-19   and best place to get  is also on [email or web address]  and don't pay more than 6-8 cents /pill !  ================================ Coronavirus (COVID-19) Are you at risk?  Are you at risk for the Coronavirus (COVID-19)?  To be considered HIGH RISK for Coronavirus (COVID-19), you have to meet the following criteria:  Traveled to [place], [place], [place], [place] or [place]; or in the [place] to [place], [place], [place]  or [place]; and have fever, cough, and shortness of breath within the last 2 weeks of travel OR Been in close contact with a person diagnosed with COVID-19 within the last 2 weeks and have  fever, cough,and shortness of breath  IF YOU DO NOT MEET THESE CRITERIA, YOU ARE CONSIDERED LOW RISK FOR COVID-19.  What to do if you are HIGH RISK for COVID-19?  If you are having a medical emergency, call 911. Seek medical care right away. Before you go to a doctor's office, urgent care or emergency department,  call ahead and tell them about your recent travel, contact with someone diagnosed with COVID-19   and your symptoms.  You should receive instructions from your physician's office regarding  next steps of care.  When you arrive at healthcare provider, tell the healthcare staff immediately you have returned from  visiting [place], [place], [place], [place] or [place]; or traveled in the [place] to [place], [place],  [place] or [place] in the last two weeks or you have been in close contact with a person diagnosed with  COVID-19 in the last 2 weeks.   Tell the health care staff about your symptoms: fever, cough and shortness of breath. After you have been seen by a medical provider, you will be either: Tested for (COVID-19) and discharged home on quarantine except to seek medical care if  symptoms worsen, and asked to  Stay home and avoid contact with others until you get your results (4-5 days)  Avoid travel on public transportation if possible (such as bus, train, or airplane) or Sent to the Emergency Department by EMS for evaluation, COVID-19 testing  and  possible admission depending on your condition and test results.  What to do if you are LOW RISK for COVID-19?  Reduce your risk of any infection by using the same precautions used for avoiding the common cold or flu:  Wash your hands often with soap and warm water for at least 20 seconds.  If soap and water are not readily available,  use an alcohol-based hand sanitizer with at least 60% alcohol.  If coughing or sneezing, cover your mouth and nose by coughing  or sneezing into the elbow areas of your shirt or coat,  into a tissue or into your sleeve (not your hands). Avoid shaking hands with others and consider head nods or verbal greetings only. Avoid touching your eyes, nose, or mouth with unwashed hands.  Avoid close contact with people who are sick. Avoid places or events with large numbers of people in one location, like concerts or sporting events. Carefully consider travel plans you have or are making. If you are planning any travel outside or inside the [place], visit the CDC's Travelers' Health webpage for  the latest health notices. If you have some symptoms but not all symptoms, continue to monitor at home and seek medical attention  if your symptoms worsen. If you are having a medical emergency, call 911. >>>>>>>>>>>>>>>>>>>>>>> Preventive Care for Adults  A healthy lifestyle and preventive care can promote health and wellness. Preventive health guidelines for women include the following key practices. A routine yearly physical is a good way to check with your health care provider about your health and preventive screening. It is a chance to share any concerns and updates on your health and to receive a thorough exam. Visit your dentist for a routine exam and preventive care every 6 months. Brush your teeth twice a day and floss once a day. Good oral hygiene prevents tooth decay and gum disease. The frequency of eye exams is based on your age, health, family medical history, use of contact lenses, and other factors. Follow your health care provider's recommendations for frequency of eye exams. Eat a healthy diet. Foods like vegetables, fruits, whole grains, low-fat dairy products, and lean protein foods contain the nutrients you need without too many calories. Decrease your intake of foods high in solid fats, added sugars, and salt. Eat the right amount of calories for you. Get information about a proper diet from your health care provider, if necessary. Regular physical exercise is one of the most important things you can do for your health. Most adults should get at least 150 minutes of moderate-intensity exercise (any activity that increases your heart rate and causes you to sweat) each week. In addition, most adults need muscle-strengthening exercises on 2 or more days a week. Maintain a healthy weight. The body mass index (BMI) is a screening tool to identify possible weight problems. It provides an estimate of body fat based on height and weight. Your health care provider can find your BMI and can  help you achieve or maintain a healthy weight. For adults 20 years and older: A BMI below 18.5 is considered underweight. A BMI of 18.5 to 24.9 is normal. A BMI of 25 to 29.9 is considered overweight. A BMI of 30 and above is considered obese. Maintain normal blood lipids and cholesterol levels by exercising and minimizing your intake of saturated fat. Eat a balanced diet with plenty of fruit and vegetables. Blood tests for lipids and cholesterol should begin at age 43 and be repeated every 5 years. If your lipid or cholesterol levels are high, you are over 50, or you are at high risk for heart disease, you may need your cholesterol levels checked more frequently. Ongoing high lipid and cholesterol levels should be treated with medicines if diet and exercise are not working. If you smoke, find out from your health care provider how to quit. If you do not use tobacco, do not start. Lung cancer screening is recommended for adults aged 11-80 years who are at high risk for developing  lung cancer because of a history of smoking. A yearly low-dose CT scan of the lungs is recommended for people who have at least a 30-pack-year history of smoking and are a current smoker or have quit within the past 15 years. A pack year of smoking is smoking an average of 1 pack of cigarettes a day for 1 year (for example: 1 pack a day for 30 years or 2 packs a day for 15 years). Yearly screening should continue until the smoker has stopped smoking for at least 15 years. Yearly screening should be stopped for people who develop a health problem that would prevent them from having lung cancer treatment. High blood pressure causes heart disease and increases the risk of stroke. Your blood pressure should be checked at least every 1 to 2 years. Ongoing high blood pressure should be treated with medicines if weight loss and exercise do not work. If you are 32-3 years old, ask your health care provider if you should take aspirin to  prevent strokes. Diabetes screening involves taking a blood sample to check your fasting blood sugar level. This should be done once every 3 years, after age 32, if you are within normal weight and without risk factors for diabetes. Testing should be considered at a younger age or be carried out more frequently if you are overweight and have at least 1 risk factor for diabetes. Breast cancer screening is essential preventive care for women. You should practice "breast self-awareness." This means understanding the normal appearance and feel of your breasts and may include breast self-examination. Any changes detected, no matter how small, should be reported to a health care provider. Women in their 45s and 30s should have a clinical breast exam (CBE) by a health care provider as part of a regular health exam every 1 to 3 years. After age 83, women should have a CBE every year. Starting at age 92, women should consider having a mammogram (breast X-ray test) every year. Women who have a family history of breast cancer should talk to their health care provider about genetic screening. Women at a high risk of breast cancer should talk to their health care providers about having an MRI and a mammogram every year. Breast cancer gene (BRCA)-related cancer risk assessment is recommended for women who have family members with BRCA-related cancers. BRCA-related cancers include breast, ovarian, tubal, and peritoneal cancers. Having family members with these cancers may be associated with an increased risk for harmful changes (mutations) in the breast cancer genes BRCA1 and BRCA2. Results of the assessment will determine the need for genetic counseling and BRCA1 and BRCA2 testing. Routine pelvic exams to screen for cancer are no longer recommended for nonpregnant women who are considered low risk for cancer of the pelvic organs (ovaries, uterus, and vagina) and who do not have symptoms. Ask your health care provider if a  screening pelvic exam is right for you. If you have had past treatment for cervical cancer or a condition that could lead to cancer, you need Pap tests and screening for cancer for at least 20 years after your treatment. If Pap tests have been discontinued, your risk factors (such as having a new sexual partner) need to be reassessed to determine if screening should be resumed. Some women have medical problems that increase the chance of getting cervical cancer. In these cases, your health care provider may recommend more frequent screening and Pap tests. Colorectal cancer can be detected and often prevented. Most routine colorectal  cancer screening begins at the age of 50 years and continues through age 75 years. However, your health care provider may recommend screening at an earlier age if you have risk factors for colon cancer. On a yearly basis, your health care provider may provide home test kits to check for hidden blood in the stool. Use of a small camera at the end of a tube, to directly examine the colon (sigmoidoscopy or colonoscopy), can detect the earliest forms of colorectal cancer. Talk to your health care provider about this at age 50, when routine screening begins.  Direct exam of the colon should be repeated every 5-10 years through age 75 years, unless early forms of pre-cancerous polyps or small growths are found. Hepatitis C blood testing is recommended for all people born from 1945 through 1965 and any individual with known risks for hepatitis C. Pra Osteoporosis is a disease in which the bones lose minerals and strength with aging. This can result in serious bone fractures or breaks. The risk of osteoporosis can be identified using a bone density scan. Women ages 65 years and over and women at risk for fractures or osteoporosis should discuss screening with their health care providers. Ask your health care provider whether you should take a calcium supplement or vitamin D to reduce the  rate of osteoporosis. Menopause can be associated with physical symptoms and risks. Hormone replacement therapy is available to decrease symptoms and risks. You should talk to your health care provider about whether hormone replacement therapy is right for you. Use sunscreen. Apply sunscreen liberally and repeatedly throughout the day. You should seek shade when your shadow is shorter than you. Protect yourself by wearing long sleeves, pants, a wide-brimmed hat, and sunglasses year round, whenever you are outdoors. Once a month, do a whole body skin exam, using a mirror to look at the skin on your back. Tell your health care provider of new moles, moles that have irregular borders, moles that are larger than a pencil eraser, or moles that have changed in shape or color. Stay current with required vaccines (immunizations). Influenza vaccine. All adults should be immunized every year. Tetanus, diphtheria, and acellular pertussis (Td, Tdap) vaccine. Pregnant women should receive 1 dose of Tdap vaccine during each pregnancy. The dose should be obtained regardless of the length of time since the last dose. Immunization is preferred during the 27th-36th week of gestation. An adult who has not previously received Tdap or who does not know her vaccine status should receive 1 dose of Tdap. This initial dose should be followed by tetanus and diphtheria toxoids (Td) booster doses every 10 years. Adults with an unknown or incomplete history of completing a 3-dose immunization series with Td-containing vaccines should begin or complete a primary immunization series including a Tdap dose. Adults should receive a Td booster every 10 years. Varicella vaccine. An adult without evidence of immunity to varicella should receive 2 doses or a second dose if she has previously received 1 dose. Pregnant females who do not have evidence of immunity should receive the first dose after pregnancy. This first dose should be obtained  before leaving the health care facility. The second dose should be obtained 4-8 weeks after the first dose. Human papillomavirus (HPV) vaccine. Females aged 13-26 years who have not received the vaccine previously should obtain the 3-dose series. The vaccine is not recommended for use in pregnant females. However, pregnancy testing is not needed before receiving a dose. If a female is found to   be pregnant after receiving a dose, no treatment is needed. In that case, the remaining doses should be delayed until after the pregnancy. Immunization is recommended for any person with an immunocompromised condition through the age of 26 years if she did not get any or all doses earlier. During the 3-dose series, the second dose should be obtained 4-8 weeks after the first dose. The third dose should be obtained 24 weeks after the first dose and 16 weeks after the second dose. Zoster vaccine. One dose is recommended for adults aged 60 years or older unless certain conditions are present. Measles, mumps, and rubella (MMR) vaccine. Adults born before 1957 generally are considered immune to measles and mumps. Adults born in 1957 or later should have 1 or more doses of MMR vaccine unless there is a contraindication to the vaccine or there is laboratory evidence of immunity to each of the three diseases. A routine second dose of MMR vaccine should be obtained at least 28 days after the first dose for students attending postsecondary schools, health care workers, or international travelers. People who received inactivated measles vaccine or an unknown type of measles vaccine during 1963-1967 should receive 2 doses of MMR vaccine. People who received inactivated mumps vaccine or an unknown type of mumps vaccine before 1979 and are at high risk for mumps infection should consider immunization with 2 doses of MMR vaccine. For females of childbearing age, rubella immunity should be determined. If there is no evidence of immunity,  females who are not pregnant should be vaccinated. If there is no evidence of immunity, females who are pregnant should delay immunization until after pregnancy. Unvaccinated health care workers born before 1957 who lack laboratory evidence of measles, mumps, or rubella immunity or laboratory confirmation of disease should consider measles and mumps immunization with 2 doses of MMR vaccine or rubella immunization with 1 dose of MMR vaccine. Pneumococcal 13-valent conjugate (PCV13) vaccine. When indicated, a person who is uncertain of her immunization history and has no record of immunization should receive the PCV13 vaccine. An adult aged 19 years or older who has certain medical conditions and has not been previously immunized should receive 1 dose of PCV13 vaccine. This PCV13 should be followed with a dose of pneumococcal polysaccharide (PPSV23) vaccine. The PPSV23 vaccine dose should be obtained at least 1 or more year(s) after the dose of PCV13 vaccine. An adult aged 19 years or older who has certain medical conditions and previously received 1 or more doses of PPSV23 vaccine should receive 1 dose of PCV13. The PCV13 vaccine dose should be obtained 1 or more years after the last PPSV23 vaccine dose.  Pneumococcal polysaccharide (PPSV23) vaccine. When PCV13 is also indicated, PCV13 should be obtained first. All adults aged 65 years and older should be immunized. An adult younger than age 65 years who has certain medical conditions should be immunized. Any person who resides in a nursing home or long-term care facility should be immunized. An adult smoker should be immunized. People with an immunocompromised condition and certain other conditions should receive both PCV13 and PPSV23 vaccines. People with human immunodeficiency virus (HIV) infection should be immunized as soon as possible after diagnosis. Immunization during chemotherapy or radiation therapy should be avoided. Routine use of PPSV23 vaccine is  not recommended for American Indians, [place] Natives, or people younger than 65 years unless there are medical conditions that require PPSV23 vaccine. When indicated, people who have unknown immunization and have no record of immunization should receive   PPSV23 vaccine. One-time revaccination 5 years after the first dose of PPSV23 is recommended for people aged 19-64 years who have chronic kidney failure, nephrotic syndrome, asplenia, or immunocompromised conditions. People who received 1-2 doses of PPSV23 before age 56 years should receive another dose of PPSV23 vaccine at age 89 years or later if at least 5 years have passed since the previous dose. Doses of PPSV23 are not needed for people immunized with PPSV23 at or after age 70 years.  Preventive Services / Frequency  Ages 96 to 55 years Blood pressure check. Lipid and cholesterol check. Lung cancer screening. / Every year if you are aged 80-80 years and have a 30-pack-year history of smoking and currently smoke or have quit within the past 15 years. Yearly screening is stopped once you have quit smoking for at least 15 years or develop a health problem that would prevent you from having lung cancer treatment. Clinical breast exam.** / Every year after age 23 years.  BRCA-related cancer risk assessment.** / For women who have family members with a BRCA-related cancer (breast, ovarian, tubal, or peritoneal cancers). Mammogram.** / Every year beginning at age 27 years and continuing for as long as you are in good health. Consult with your health care provider. Pap test.** / Every 3 years starting at age 89 years through age 28 or 28 years with a history of 3 consecutive normal Pap tests. HPV screening.** / Every 3 years from ages 52 years through ages 70 to 10 years with a history of 3 consecutive normal Pap tests. Fecal occult blood test (FOBT) of stool. / Every year beginning at age 67 years and continuing until age 43 years. You may not need to do  this test if you get a colonoscopy every 10 years. Flexible sigmoidoscopy or colonoscopy.** / Every 5 years for a flexible sigmoidoscopy or every 10 years for a colonoscopy beginning at age 47 years and continuing until age 39 years. Hepatitis C blood test.** / For all people born from [birth year] through 1965 and any individual with known risks for hepatitis C. Skin self-exam. / Monthly. Influenza vaccine. / Every year. Tetanus, diphtheria, and acellular pertussis (Tdap/Td) vaccine.** / Consult your health care provider. Pregnant women should receive 1 dose of Tdap vaccine during each pregnancy. 1 dose of Td every 10 years. Varicella vaccine.** / Consult your health care provider. Pregnant females who do not have evidence of immunity should receive the first dose after pregnancy. Zoster vaccine.** / 1 dose for adults aged 32 years or older. Pneumococcal 13-valent conjugate (PCV13) vaccine.** / Consult your health care provider. Pneumococcal polysaccharide (PPSV23) vaccine.** / 1 to 2 doses if you smoke cigarettes or if you have certain conditions. Meningococcal vaccine.** / Consult your health care provider. Hepatitis A vaccine.** / Consult your health care provider. Hepatitis B vaccine.** / Consult your health care provider. Screening for abdominal aortic aneurysm (AAA)  by ultrasound is recommended for people over 50 who have history of high blood pressure or who are current or former smokers. ++++++++++++++++++ Recommend Adult Low Dose Aspirin or  coated  Aspirin 81 mg daily  To reduce risk of Colon Cancer 40 %,  Skin Cancer 26 % ,  Melanoma 46%  and  Pancreatic cancer 60% +++++++++++++++++++ Vitamin D goal  is between 70-100.  Please make sure that you are taking your Vitamin D as directed.  It is very important as a natural anti-inflammatory  helping hair, skin, and nails, as well as reducing stroke and heart  attack risk.  It helps your bones and helps with mood. It also decreases  numerous cancer risks so please take it as directed.  Low Vit D is associated with a 200-300% higher risk for CANCER  and 200-300% higher risk for HEART   ATTACK  &  STROKE.   ...................................... It is also associated with higher death rate at younger ages,  autoimmune diseases like Rheumatoid arthritis, Lupus, Multiple Sclerosis.    Also many other serious conditions, like depression, Alzheimer's Dementia, infertility, muscle aches, fatigue, fibromyalgia - just to name a few. ++++++++++++++++++ Recommend the book "The END of DIETING" by Dr Joel Fuhrman  & the book "The END of DIABETES " by Dr Joel Fuhrman At [employer] - get book & Audio CD's    Being diabetic has a  300% increased risk for heart attack, stroke, cancer, and alzheimer- type vascular dementia. It is very important that you work harder with diet by avoiding all foods that are white. Avoid white rice (brown & wild rice is OK), white potatoes (sweetpotatoes in moderation is OK), White bread or wheat bread or anything made out of white flour like bagels, donuts, rolls, buns, biscuits, cakes, pastries, cookies, pizza crust, and pasta (made from white flour & egg whites) - vegetarian pasta or spinach or wheat pasta is OK. Multigrain breads like Arnold's or [employer], or multigrain sandwich thins or flatbreads.  Diet, exercise and weight loss can reverse and cure diabetes in the early stages.  Diet, exercise and weight loss is very important in the control and prevention of complications of diabetes which affects every system in your body, ie. Brain - dementia/stroke, eyes - glaucoma/blindness, heart - heart attack/heart failure, kidneys - dialysis, stomach - gastric paralysis, intestines - malabsorption, nerves - severe painful neuritis, circulation - gangrene & loss of a leg(s), and finally cancer and Alzheimers.    I recommend avoid fried & greasy foods,  sweets/candy, white rice (brown or wild rice or Quinoa is  OK), white potatoes (sweet potatoes are OK) - anything made from white flour - bagels, doughnuts, rolls, buns, biscuits,white and wheat breads, pizza crust and traditional pasta made of white flour & egg white(vegetarian pasta or spinach or wheat pasta is OK).  Multi-grain bread is OK - like multi-grain flat bread or sandwich thins. Avoid alcohol in excess. Exercise is also important.    Eat all the vegetables you want - avoid meat, especially red meat and dairy - especially cheese.  Cheese is the most concentrated form of trans-fats which is the worst thing to clog up our arteries. Veggie cheese is OK which can be found in the fresh produce section at [employer] or [employer] or [employer]  ++++++++++++++++++++++ DASH Eating Plan  DASH stands for "Dietary Approaches to Stop Hypertension."   The DASH eating plan is a healthy eating plan that has been shown to reduce high blood pressure (hypertension). Additional health benefits may include reducing the risk of type 2 diabetes mellitus, heart disease, and stroke. The DASH eating plan may also help with weight loss. WHAT DO I NEED TO KNOW ABOUT THE DASH EATING PLAN? For the DASH eating plan, you will follow these general guidelines: Choose foods with a percent daily value for sodium of less than 5% (as listed on the food label). Use salt-free seasonings or herbs instead of table salt or sea salt. Check with your health care provider or pharmacist before using salt substitutes. Eat lower-sodium products, often labeled as "lower sodium" or "no   salt added." Eat fresh foods. Eat more vegetables, fruits, and low-fat dairy products. Choose whole grains. Look for the word "whole" as the first word in the ingredient list. Choose fish  Limit sweets, desserts, sugars, and sugary drinks. Choose heart-healthy fats. Eat veggie cheese  Eat more home-cooked food and less restaurant, buffet, and fast food. Limit fried foods. Cook foods using methods other  than frying. Limit canned vegetables. If you do use them, rinse them well to decrease the sodium. When eating at a restaurant, ask that your food be prepared with less salt, or no salt if possible.                      WHAT FOODS CAN I EAT? Read Dr Joel Fuhrman's books on The End of Dieting & The End of Diabetes  Grains Whole grain or whole wheat bread. Brown rice. Whole grain or whole wheat pasta. Quinoa, bulgur, and whole grain cereals. Low-sodium cereals. Corn or whole wheat flour tortillas. Whole grain cornbread. Whole grain crackers. Low-sodium crackers.  Vegetables Fresh or frozen vegetables (raw, steamed, roasted, or grilled). Low-sodium or reduced-sodium tomato and vegetable juices. Low-sodium or reduced-sodium tomato sauce and paste. Low-sodium or reduced-sodium canned vegetables.   Fruits All fresh, canned (in natural juice), or frozen fruits.  Protein Products  All fish and seafood.  Dried beans, peas, or lentils. Unsalted nuts and seeds. Unsalted canned beans.  Dairy Low-fat dairy products, such as skim or 1% milk, 2% or reduced-fat cheeses, low-fat ricotta or cottage cheese, or plain low-fat yogurt. Low-sodium or reduced-sodium cheeses.  Fats and Oils Tub margarines without trans fats. Light or reduced-fat mayonnaise and salad dressings (reduced sodium). Avocado. Safflower, olive, or canola oils. Natural peanut or almond butter.  Other Unsalted popcorn and pretzels. The items listed above may not be a complete list of recommended foods or beverages. Contact your dietitian for more options.  ++++++++++++++++++  WHAT FOODS ARE NOT RECOMMENDED? Grains/ White flour or wheat flour White bread. White pasta. White rice. Refined cornbread. Bagels and croissants. Crackers that contain trans fat.  Vegetables  Creamed or fried vegetables. Vegetables in a . Regular canned vegetables. Regular canned tomato sauce and paste. Regular tomato and vegetable juices.  Fruits Dried  fruits. Canned fruit in light or heavy syrup. Fruit juice.  Meat and Other Protein Products Meat in general - RED meat & White meat.  Fatty cuts of meat. Ribs, chicken wings, all processed meats as bacon, sausage, bologna, salami, fatback, hot dogs, bratwurst and packaged luncheon meats.  Dairy Whole or 2% milk, cream, half-and-half, and cream cheese. Whole-fat or sweetened yogurt. Full-fat cheeses or blue cheese. Non-dairy creamers and whipped toppings. Processed cheese, cheese spreads, or cheese curds.  Condiments Onion and garlic salt, seasoned salt, table salt, and sea salt. Canned and packaged gravies. Worcestershire sauce. Tartar sauce. Barbecue sauce. Teriyaki sauce. Soy sauce, including reduced sodium. Steak sauce. Fish sauce. Oyster sauce. Cocktail sauce. Horseradish. Ketchup and mustard. Meat flavorings and tenderizers. Bouillon cubes. Hot sauce. Tabasco sauce. Marinades. Taco seasonings. Relishes.  Fats and Oils Butter, stick margarine, lard, shortening and bacon fat. Coconut, palm kernel, or palm oils. Regular salad dressings.  Pickles and olives. Salted popcorn and pretzels.  The items listed above may not be a complete list of foods and beverages to avoid.   

## 2020-12-13 ENCOUNTER — Encounter: Payer: Self-pay | Admitting: Internal Medicine

## 2020-12-13 ENCOUNTER — Other Ambulatory Visit: Payer: Self-pay | Admitting: Internal Medicine

## 2020-12-13 DIAGNOSIS — E782 Mixed hyperlipidemia: Secondary | ICD-10-CM

## 2020-12-13 MED ORDER — ROSUVASTATIN CALCIUM 10 MG PO TABS: ORAL_TABLET | ORAL | 3 refills | Status: DC

## 2020-12-13 NOTE — Progress Notes (Signed)
============================================================ -   Test results slightly outside the reference range are not unusual. If there is anything important, I will review this with you,  otherwise it is considered normal test values.  If you have further questions,  please do not hesitate to contact me at the office or via My Chart.  ============================================================ ============================================================  -  Iron & Vitamin B12 levels - Both Normal   ============================================================ ============================================================  -  Total Chol = 220  too high          (  Ideal or Goal is less than 180  !  )   - and   - Bad /Dangerous LDL Chol = 134 - Also way too high            (  Ideal or Goal is less than 70  !  )  - Increased Cholesterol is the major risk factor for developing                                        vascular Dementia, Having a Heart Attack  or Stroke  !   - Cholesterol only comes from animal sources  - ie. meat, dairy, egg yolks  - Eat all the vegetables you want.  - Avoid meat, especially red meat - Beef AND Pork .  - Avoid cheese & dairy - milk & ice cream.     - Cheese is the most concentrated form of trans-fats which  is the worst thing to clog up our arteries.   - Veggie cheese is OK which can be found in the fresh  produce section at [employer] or [employer] or [employer] ============================================================ ============================================================  -  New Rx for Rosuvastatin / Crestor sent to your Pharmacy  - Please call office to schedule an appointment in                                                                 3 months to recheck your cholesterol  ============================================================ ============================================================  -  Vitamin D = 66 -  Excellent  ============================================================ ============================================================  -  All Else - CBC - Kidneys - Electrolytes - Liver - Magnesium & Thyroid    - all  Normal / OK ============================================================ ============================================================

## 2020-12-15 LAB — COMPLETE METABOLIC PANEL WITH GFR
AG Ratio: 1.7 (calc) (ref 1.0–2.5)
ALT: 12 U/L (ref 6–29)
AST: 20 U/L (ref 10–35)
Albumin: 4.3 g/dL (ref 3.6–5.1)
Alkaline phosphatase (APISO): 71 U/L (ref 37–153)
BUN: 12 mg/dL (ref 7–25)
CO2: 27 mmol/L (ref 20–32)
Calcium: 9.3 mg/dL (ref 8.6–10.4)
Chloride: 105 mmol/L (ref 98–110)
Creat: 0.85 mg/dL (ref 0.50–0.99)
GFR, Est African American: 85 mL/min/{1.73_m2} (ref >=60)
GFR, Est Non African American: 73 mL/min/{1.73_m2} (ref >=60)
Globulin: 2.5 g/dL (calc) (ref 1.9–3.7)
Glucose, Bld: 79 mg/dL (ref 65–99)
Potassium: 4 mmol/L (ref 3.5–5.3)
Sodium: 142 mmol/L (ref 135–146)
Total Bilirubin: 0.4 mg/dL (ref 0.2–1.2)
Total Protein: 6.8 g/dL (ref 6.1–8.1)

## 2020-12-15 LAB — HEMOGLOBIN A1C
Hgb A1c MFr Bld: 5.3 % of total Hgb (ref <5.7)
Mean Plasma Glucose: 105 mg/dL
eAG (mmol/L): 5.8 mmol/L

## 2020-12-15 LAB — IRON, TOTAL/TOTAL IRON BINDING CAP
%SAT: 36 % (calc) (ref 16–45)
Iron: 103 ug/dL (ref 45–160)
TIBC: 287 mcg/dL (calc) (ref 250–450)

## 2020-12-15 LAB — CBC WITH DIFFERENTIAL/PLATELET
Absolute Monocytes: 277 cells/uL (ref 200–950)
Basophils Absolute: 0 cells/uL (ref 0–200)
Basophils Relative: 0 %
Eosinophils Absolute: 62 cells/uL (ref 15–500)
Eosinophils Relative: 1.4 %
HCT: 38.7 % (ref 35.0–45.0)
Hemoglobin: 12.9 g/dL (ref 11.7–15.5)
Lymphs Abs: 1377 cells/uL (ref 850–3900)
MCH: 29.9 pg (ref 27.0–33.0)
MCHC: 33.3 g/dL (ref 32.0–36.0)
MCV: 89.8 fL (ref 80.0–100.0)
MPV: 12.1 fL (ref 7.5–12.5)
Monocytes Relative: 6.3 %
Neutro Abs: 2684 cells/uL (ref 1500–7800)
Neutrophils Relative %: 61 %
Platelets: 207 10*3/uL (ref 140–400)
RBC: 4.31 10*6/uL (ref 3.80–5.10)
RDW: 12.3 % (ref 11.0–15.0)
Total Lymphocyte: 31.3 %
WBC: 4.4 10*3/uL (ref 3.8–10.8)

## 2020-12-15 LAB — LIPID PANEL
Cholesterol: 220 mg/dL — ABNORMAL HIGH (ref <200)
HDL: 56 mg/dL (ref >=50)
LDL Cholesterol (Calc): 134 mg/dL (calc) — ABNORMAL HIGH
Non-HDL Cholesterol (Calc): 164 mg/dL (calc) — ABNORMAL HIGH (ref <130)
Total CHOL/HDL Ratio: 3.9 (calc) (ref <5.0)
Triglycerides: 161 mg/dL — ABNORMAL HIGH (ref <150)

## 2020-12-15 LAB — URINALYSIS, ROUTINE W REFLEX MICROSCOPIC
Bilirubin Urine: NEGATIVE
Glucose, UA: NEGATIVE
Hgb urine dipstick: NEGATIVE
Ketones, ur: NEGATIVE
Leukocytes,Ua: NEGATIVE
Nitrite: NEGATIVE
Protein, ur: NEGATIVE
Specific Gravity, Urine: 1.004 (ref 1.001–1.035)
pH: 7 (ref 5.0–8.0)

## 2020-12-15 LAB — INSULIN, RANDOM: Insulin: 13.9 u[IU]/mL

## 2020-12-15 LAB — VITAMIN B12: Vitamin B-12: 583 pg/mL (ref 200–1100)

## 2020-12-15 LAB — MICROALBUMIN / CREATININE URINE RATIO
Creatinine, Urine: 14 mg/dL — ABNORMAL LOW (ref 20–275)
Microalb, Ur: <0.2 mg/dL

## 2020-12-15 LAB — TB SKIN TEST
Induration: 0 mm
TB Skin Test: NEGATIVE

## 2020-12-15 LAB — VITAMIN D 25 HYDROXY (VIT D DEFICIENCY, FRACTURES): Vit D, 25-Hydroxy: 66 ng/mL (ref 30–100)

## 2020-12-15 LAB — TSH: TSH: 3.82 mIU/L (ref 0.40–4.50)

## 2020-12-15 LAB — MAGNESIUM: Magnesium: 2.2 mg/dL (ref 1.5–2.5)

## 2020-12-16 ENCOUNTER — Encounter: Payer: Self-pay | Admitting: Internal Medicine

## 2020-12-31 ENCOUNTER — Other Ambulatory Visit: Payer: Self-pay

## 2020-12-31 ENCOUNTER — Ambulatory Visit (INDEPENDENT_AMBULATORY_CARE_PROVIDER_SITE_OTHER): Payer: 59 | Admitting: Nurse Practitioner

## 2020-12-31 ENCOUNTER — Ambulatory Visit
Admission: RE | Admit: 2020-12-31 | Discharge: 2020-12-31 | Disposition: A | Discharge status: Home / Self Care | Payer: 59 | Source: Ambulatory Visit | Attending: Nurse Practitioner | Admitting: Nurse Practitioner

## 2020-12-31 ENCOUNTER — Encounter: Payer: Self-pay | Admitting: Nurse Practitioner

## 2020-12-31 VITALS — BP 107/78 | HR 83 | Temp 97.5°F | Resp 17 | Ht 67.0 in | Wt 139.6 lb

## 2020-12-31 DIAGNOSIS — M25572 Pain in left ankle and joints of left foot: Secondary | ICD-10-CM

## 2020-12-31 NOTE — Patient Instructions (Signed)
Ankle Pain The ankle joint holds your body weight and allows you to move around. Ankle pain can occur on either side or the back of one ankle or both ankles. Ankle pain may be sharp and burning or dull and aching. There may be tenderness, stiffness, redness, or warmth around the ankle. Many things can cause anklepain, including an injury to the area and overuse of the ankle. Follow these instructions at home: Activity Rest your ankle as told by your health care provider. Avoid any activities that cause ankle pain. Do not use the injured limb to support your body weight until your health care provider says that you can. Use crutches as told by your health care provider. Do exercises as told by your health care provider. Ask your health care provider when it is safe to drive if you have a brace on your ankle. If you have a brace: Wear the brace as told by your health care provider. Remove it only as told by your health care provider. Loosen the brace if your toes tingle, become numb, or turn cold and blue. Keep the brace clean. If the brace is not waterproof: Do not let it get wet. Cover it with a watertight covering when you take a bath or shower. If you were given an elastic bandage:  Remove it when you take a bath or a shower. Try not to move your ankle very much, but wiggle your toes from time to time. This helps to prevent swelling. Adjust the bandage to make it more comfortable if it feels too tight. Loosen the bandage if you have numbness or tingling in your foot or if your foot turns cold and blue.  Managing pain, stiffness, and swelling  If directed, put ice on the painful area. If you have a removable brace or elastic bandage, remove it as told by your health care provider. Put ice in a plastic bag. Place a towel between your skin and the bag. Leave the ice on for 20 minutes, 2-3 times a day. Move your toes often to avoid stiffness and to lessen swelling. Raise (elevate) your  ankle above the level of your heart while you are sitting or lying down.  General instructions Record information about your pain. Writing down the following may be helpful for you and your health care provider: How often you have ankle pain. Where the pain is located. What the pain feels like. If treatment involves wearing a prescribed shoe or insole, make sure you wear it correctly and for as long as told by your health care provider. Take over-the-counter and prescription medicines only as told by your health care provider. Keep all follow-up visits as told by your health care provider. This is important. Contact a health care provider if: Your pain gets worse. Your pain is not relieved with medicines. You have a fever or chills. You are having more trouble with walking. You have new symptoms. Get help right away if: Your foot, leg, toes, or ankle: Tingles or becomes numb. Becomes swollen. Turns pale or blue. Summary Ankle pain can occur on either side or the back of one ankle or both ankles. Ankle pain may be sharp and burning or dull and aching. Rest your ankle as told by your health care provider. If told, apply ice to the area. Take over-the-counter and prescription medicines only as told by your health care provider. This information is not intended to replace advice given to you by your health care provider. Make sure you   discuss any questions you have with your healthcare provider. Document Revised: 10/03/2018 Document Reviewed: 12/21/2017 Elsevier Patient Education  2022 [employer]  

## 2020-12-31 NOTE — Progress Notes (Signed)
Assessment and Plan:   Kathryn [name] was seen today for foot swelling and foot pain.  Diagnoses and all orders for this visit:  Acute left ankle pain -will order Xray of left ankle, pt can use meloxicam she has for pain. Pt given instructions on ankle pain and care, elevate and use brace if neccessary  Further disposition pending results of labs. Discussed med's effects and SE's.   Over 20cminutes of exam, counseling, chart review, and critical decision making was performed.   Future Appointments  Date Time Provider Department Center  06/18/2021 10:30 AM Kathryn Humphrey, NP [facility] None  12/14/2021 10:00 AM Lucky Cowboy, MD [facility] None    ------------------------------------------------------------------------------------------------------------------   HPI  [place] 65 y.o.female presents for left ankle pain which began approximately 1 week ago.  Does not remember any injury to the area.  It is swollen and tender to touch and with ambulation around the fibula above the lateral malleolus.  Area is slightly erythematous but not warm to the touch.    Past Medical History:  Diagnosis Date   Anxiety    Diverticulitis 2016   Fracture of fifth metacarpal bone of left hand 09/03/2011   post fall   GERD (gastroesophageal reflux disease)    Osteoarthritis      Allergies  Allergen Reactions   Amoxil [Amoxicillin] Itching   Penicillins Rash    ? Rash as child    Current Outpatient Medications on File Prior to Visit  Medication Sig   Cholecalciferol (VITAMIN D) 125 MCG (5000 UT) CAPS Take 1 capsule by mouth daily.   Cyanocobalamin (B-12) 500 MCG SUBL Place under the tongue daily.   cyclobenzaprine (FLEXERIL) 10 MG tablet Take  1/2 to 1 tablet  3 x /day  as needed for Muscle Spasm   famotidine (PEPCID) 20 MG tablet Take 20 mg by mouth daily. Prior to dinner. Gets OTC   meloxicam (MOBIC) 15 MG tablet Take one daily with food for 2 weeks, can take with tylenol, can not take with  aleve, iburpofen, then as needed daily for pain   omeprazole (PRILOSEC) 40 MG capsule Take 1 capsule Daily prior to breakfast to prevent Indigestion & Heartburn   rosuvastatin (CRESTOR) 10 MG tablet Take 1 tablet Daily for Cholesterol   sertraline (ZOLOFT) 100 MG tablet Take 1 & 1/2 tablet  (150 mg)  Daily for Mood   vitamin C (ASCORBIC ACID) 250 MG tablet Take 250 mg by mouth daily. 1 gummy daily.   Zinc 50 MG CAPS Take by mouth daily.   loratadine (CLARITIN) 10 MG tablet Take 10 mg by mouth daily. (Patient not taking: Reported on 12/31/2020)   No current facility-administered medications on file prior to visit.    ROS: all negative except above.   Physical Exam:  BP 107/78   Pulse 83   Temp (!) 97.5 F (36.4 C)   Resp 17   Ht 5\' 7"  (1.702 m)   Wt 139 lb 9.6 oz (63.3 kg)   SpO2 98%   BMI 21.86 kg/m   General Appearance: Well nourished, in no apparent distress. Eyes: PERRLA, EOMs, conjunctiva no swelling or erythema Neck: Supple, thyroid normal.  Respiratory: Respiratory effort normal, BS equal bilaterally without rales, rhonchi, wheezing or stridor.  Cardio: RRR with no MRGs. Brisk peripheral pulses without edema.  Abdomen: Soft, + BS.  Non tender, no guarding, rebound, hernias, masses. Lymphatics: Non tender without lymphadenopathy.  Musculoskeletal: ankle without gross deformity. Swelling of left ankle. Tender to palpation laterally. Pain with  passive external rotation>inversion. Ligamentous function stable and neurovascularly intact. Pain with WB effort. Skin: Warm, dry without rashes, lesions, ecchymosis.  Neuro: Cranial nerves intact. Normal muscle tone, no cerebellar symptoms. Sensation intact.  Psych: Awake and oriented X 3, normal affect, Insight and Judgment appropriate.     Kathryn Humphrey, NP 3:22 PM [facility]

## 2021-01-15 ENCOUNTER — Other Ambulatory Visit: Payer: Self-pay | Admitting: Adult Health Nurse Practitioner

## 2021-01-15 ENCOUNTER — Other Ambulatory Visit: Payer: Self-pay | Admitting: Internal Medicine

## 2021-01-15 DIAGNOSIS — M542 Cervicalgia: Secondary | ICD-10-CM

## 2021-01-15 DIAGNOSIS — M545 Low back pain, unspecified: Secondary | ICD-10-CM

## 2021-01-15 DIAGNOSIS — F419 Anxiety disorder, unspecified: Secondary | ICD-10-CM

## 2021-01-15 MED ORDER — SERTRALINE HCL 100 MG PO TABS: ORAL_TABLET | ORAL | 3 refills | Status: DC

## 2021-05-06 NOTE — Progress Notes (Signed)
Assessment and Plan:   Diagnoses and all orders for this visit:  Cough headache -     POC COVID-19 -     POCT Influenza A/B Flu and covid are negative  URI, acute -     azithromycin (ZITHROMAX) 250 MG tablet; Take 2 tablets (500 mg) on  Day 1,  followed by 1 tablet (250 mg) once daily on Days 2 through 5. -     dexamethasone (DECADRON) 1 MG tablet; Take 3 tabs for 3 days, 2 tabs for 3 days 1 tab for 5 days. Take with food. -     promethazine-dextromethorphan (PROMETHAZINE-DM) 6.25-15 MG/5ML syrup; Take 5 mLs by mouth 4 (four) times daily as needed for cough.  Push fluids, Vit C and Mucinex DM as needed.  Do not take Mucinex DM with cough syrup Advised to limit Sudafed and Nyquil due to potential to raise BP  Further disposition pending results of labs. Discussed med's effects and SE's.   Over 30 minutes of exam, counseling, chart review, and critical decision making was performed.   Future Appointments  Date Time Provider Department Center  06/18/2021 10:30 AM Revonda Humphrey, NP [facility] None  12/14/2021 10:00 AM Lucky Cowboy, MD [facility] None    ------------------------------------------------------------------------------------------------------------------   HPI BP 136/80   Pulse 95   Temp 98.1 F (36.7 C)   Resp 18   Ht 5\' 7"  (1.702 m)   Wt 138 lb (62.6 kg)   SpO2 97%   BMI 21.61 kg/m  65 y.o.female presents for cough, congestion  Starting 6 days ago she started having congestion and nonproductive coughing. She does have a lot of sinus pressure, has been running a fever of 100 at home. She is also having a sore throat, grandson had strep a week ago. . Past Medical History:  Diagnosis Date   Anxiety    Diverticulitis 2016   Fracture of fifth metacarpal bone of left hand 09/03/2011   post fall   GERD (gastroesophageal reflux disease)    Osteoarthritis      Allergies  Allergen Reactions   Amoxil [Amoxicillin] Itching   Penicillins Rash    ? Rash as child     Current Outpatient Medications on File Prior to Visit  Medication Sig   Cholecalciferol (VITAMIN D) 125 MCG (5000 UT) CAPS Take 1 capsule by mouth daily.   Cyanocobalamin (B-12) 500 MCG SUBL Place under the tongue daily.   cyclobenzaprine (FLEXERIL) 10 MG tablet Take  1/2 to 1 tablet  3 x /day  as needed for Muscle Spasm   famotidine (PEPCID) 20 MG tablet Take 20 mg by mouth daily. Prior to dinner. Gets OTC   loratadine (CLARITIN) 10 MG tablet Take 10 mg by mouth daily. (Patient not taking: Reported on 12/31/2020)   meloxicam (MOBIC) 15 MG tablet TAKE 1 TABLET BY MOUTH DAILY WITH FOOD AS NEEDED FOR PAIN FOR 2 WEEKS. MAY TAKE WITH TYLENOL. DO NOT TAKE WITH ALEVE OR IBUPROFEN   omeprazole (PRILOSEC) 40 MG capsule Take 1 capsule Daily prior to breakfast to prevent Indigestion & Heartburn   rosuvastatin (CRESTOR) 10 MG tablet Take 1 tablet Daily for Cholesterol   sertraline (ZOLOFT) 100 MG tablet Take 1 & 1/2 tablet  (150 mg)  Daily for Mood   vitamin C (ASCORBIC ACID) 250 MG tablet Take 250 mg by mouth daily. 1 gummy daily.   Zinc 50 MG CAPS Take by mouth daily.   No current facility-administered medications on file prior to visit.  ROS: all negative except above.   Physical Exam:  BP 136/80   Pulse 95   Temp 98.1 F (36.7 C)   Resp 18   Ht 5\' 7"  (1.702 m)   Wt 138 lb (62.6 kg)   SpO2 97%   BMI 21.61 kg/m   General Appearance: Well nourished, in no apparent distress. Eyes: PERRLA, EOMs, conjunctiva no swelling or erythema Sinuses: Positive frontal tenderness ENT/Mouth: Ext aud canals clear, TMs dull/bulging. No erythema, swelling, or exudate on post pharynx .  Tonsils not swollen or erythematous. Hearing normal.  Neck: Supple, thyroid normal.  Respiratory: Respiratory effort normal, BS equal bilaterally without rales, rhonchi, wheezing or stridor.  Cardio: RRR with no MRGs. Brisk peripheral pulses without edema.  Abdomen: Soft, + BS.  Non tender, no guarding, rebound,  hernias, masses. Lymphatics: Tender submandibular adenopathy bilaterally  Musculoskeletal: Full ROM, 5/5 strength, normal gait.  Skin: Warm, dry without rashes, lesions, ecchymosis.  Neuro: Cranial nerves intact. Normal muscle tone, no cerebellar symptoms. Sensation intact.  Psych: Awake and oriented X 3, normal affect, Insight and Judgment appropriate.     , NP 10:11 AM [facility]

## 2021-05-07 ENCOUNTER — Encounter: Payer: Self-pay | Admitting: Nurse Practitioner

## 2021-05-07 ENCOUNTER — Ambulatory Visit (INDEPENDENT_AMBULATORY_CARE_PROVIDER_SITE_OTHER): Payer: 59 | Admitting: Nurse Practitioner

## 2021-05-07 ENCOUNTER — Other Ambulatory Visit: Payer: Self-pay

## 2021-05-07 VITALS — BP 136/80 | HR 95 | Temp 98.1°F | Resp 18 | Ht 67.0 in | Wt 138.0 lb

## 2021-05-07 DIAGNOSIS — G4483 Primary cough headache: Secondary | ICD-10-CM

## 2021-05-07 DIAGNOSIS — J069 Acute upper respiratory infection, unspecified: Secondary | ICD-10-CM

## 2021-05-07 LAB — POCT INFLUENZA A/B
Influenza A, POC: NEGATIVE
Influenza B, POC: NEGATIVE

## 2021-05-07 LAB — POC COVID19 BINAXNOW: SARS Coronavirus 2 Ag: NEGATIVE

## 2021-05-07 MED ORDER — DEXAMETHASONE 1 MG PO TABS: ORAL_TABLET | ORAL | 0 refills | Status: DC

## 2021-05-07 MED ORDER — PROMETHAZINE-DM 6.25-15 MG/5ML PO SYRP: 5.0000 mL | ORAL_SOLUTION | Freq: Four times a day (QID) | ORAL | 1 refills | Status: DC | PRN

## 2021-05-07 MED ORDER — AZITHROMYCIN 250 MG PO TABS: ORAL_TABLET | ORAL | 1 refills | Status: DC

## 2021-05-07 NOTE — Patient Instructions (Signed)

## 2021-06-16 NOTE — Progress Notes (Signed)
FOLLOW UP  Assessment and Plan:   Cholesterol Currently with moderate elevations, does have maternal MI hx working on lifestyle, has reduced animal fats, increasing fiber intake Strong preference to avoid medication if possible, particularly statin Discussed zetia, CT coronary calcium; she would like information and will consider but hold off for now Continue low cholesterol diet and exercise.  Check lipid panel.  CMP  Hx of prediabetes Recent A1Cs at goal Discussed diet/exercise, weight management  A1C; check CMP  GERD Well managed on current medications Discussed diet, avoiding triggers and other lifestyle changes  Hypothyroidism Hx of recurrent mild elevations resolved;  Recheck TSH She is persistently symptomatic, consider trial of low dose synthroid if remains borderline  Vitamin D Def At goal at last visit; continue supplementation to maintain goal of 60-100 Defer Vit D level  Hx of recurrent iron def Likely r/t frequent donations;  Requesting iron recheck due to recent fatigue CBC Iron total and TIBC  Flu vaccine Need Flu Quad 6+ months PF IM given  Cough Persistent dry cough after viral infection Tessalon perles 100mg  q 6 hours as needed If develop sputum production or fever call the office and will order a chest xray  Continue diet and meds as discussed. Further disposition pending results of labs. Discussed med's effects and SE's.   Over 30 minutes of exam, counseling, chart review, and critical decision making was performed.   Future Appointments  Date Time Provider Department Center  12/14/2021 10:00 AM 12/16/2021, MD [facility] None    ----------------------------------------------------------------------------------------------------------------------  HPI 65 y.o. female  presents for 3 month follow up on cholesterol, elevated TSH, anxiety, GERD, vitamin D deficiency.   She continues to have a dry cough. States she had a URI several weeks  ago and is feeling much better but has a persistent dry cough. Denies shortness of breath, wheezing, fever, sore throat, myalgias.  She has hx of anxiety treated by sertraline 150 mg daily.   GERD taking omprazole 40 mg in AM, famotidine 20 mg at night and reports no longer having breakthrough sx at night.   BMI is Body mass index is 21.58 kg/m., she has been working on diet and exercise, very active in yard, watching her young grandson, hits 8000-10000 steps on fitbit daily. Admits some plurging over the holidays.  Wt Readings from Last 3 Encounters:  06/18/21 137 lb 12.8 oz (62.5 kg)  05/07/21 138 lb (62.6 kg)  12/31/20 139 lb 9.6 oz (63.3 kg)   Her blood pressure has been controlled at home, today their BP is BP: 110/64 BP Readings from Last 3 Encounters:  06/18/21 110/64  05/07/21 136/80  12/31/20 107/78     She does not workout but generally active. She denies chest pain, shortness of breath, dizziness.   She is on cholesterol medication, Rosuvastatin 10 mg daily.  Her cholesterol is not at goal. She did start fish oil since last visit. The cholesterol last visit was:   Lab Results  Component Value Date   CHOL 220 (H) 12/12/2020   HDL 56 12/12/2020   LDLCALC 134 (H) 12/12/2020   LDLDIRECT 130.8 01/08/2013   TRIG 161 (H) 12/12/2020   CHOLHDL 3.9 12/12/2020    She has been working on diet and exercise for hx of prediabetes, and denies increased appetite, nausea, paresthesia of the feet, polydipsia, polyuria and visual disturbances.  Last A1C in the office was:  Lab Results  Component Value Date   HGBA1C 5.3 12/12/2020   TSh was mildly elevated  at previous check (6.16 11/2019); she endorses mild fatigue, thinning hair; denies weight gain, dry skin, constipation.  Lab Results  Component Value Date   TSH 3.82 12/12/2020    Patient is on Vitamin D supplement.   Lab Results  Component Value Date   VD25OH 66 12/12/2020     B12 was found to be borderline low, recommended for  sublingual supplement Lab Results  Component Value Date   VITAMINB12 583 12/12/2020   She reports hx of intermittent mild iron def, donates blood/plasma frequently, occasionally can't give due to being too low, taking iron pill twice daily.  Lab Results  Component Value Date   IRON 103 12/12/2020   TIBC 287 12/12/2020   FERRITIN 51 06/25/2020     Current Medications:  Current Outpatient Medications on File Prior to Visit  Medication Sig   Cholecalciferol (VITAMIN D) 125 MCG (5000 UT) CAPS Take 1 capsule by mouth daily.   Cyanocobalamin (B-12) 500 MCG SUBL Place under the tongue daily.   cyclobenzaprine (FLEXERIL) 10 MG tablet Take  1/2 to 1 tablet  3 x /day  as needed for Muscle Spasm   famotidine (PEPCID) 20 MG tablet Take 20 mg by mouth daily. Prior to dinner. Gets OTC   meloxicam (MOBIC) 15 MG tablet TAKE 1 TABLET BY MOUTH DAILY WITH FOOD AS NEEDED FOR PAIN FOR 2 WEEKS. MAY TAKE WITH TYLENOL. DO NOT TAKE WITH ALEVE OR IBUPROFEN   omeprazole (PRILOSEC) 40 MG capsule Take 1 capsule Daily prior to breakfast to prevent Indigestion & Heartburn   rosuvastatin (CRESTOR) 10 MG tablet Take 1 tablet Daily for Cholesterol   sertraline (ZOLOFT) 100 MG tablet Take 1 & 1/2 tablet  (150 mg)  Daily for Mood   vitamin C (ASCORBIC ACID) 250 MG tablet Take 250 mg by mouth daily. 1 gummy daily.   Zinc 50 MG CAPS Take by mouth daily.   azithromycin (ZITHROMAX) 250 MG tablet Take 2 tablets (500 mg) on  Day 1,  followed by 1 tablet (250 mg) once daily on Days 2 through 5. (Patient not taking: Reported on 06/18/2021)   dexamethasone (DECADRON) 1 MG tablet Take 3 tabs for 3 days, 2 tabs for 3 days 1 tab for 5 days. Take with food. (Patient not taking: Reported on 06/18/2021)   loratadine (CLARITIN) 10 MG tablet Take 10 mg by mouth daily. (Patient not taking: Reported on 12/31/2020)   promethazine-dextromethorphan (PROMETHAZINE-DM) 6.25-15 MG/5ML syrup Take 5 mLs by mouth 4 (four) times daily as needed for  cough. (Patient not taking: Reported on 06/18/2021)   No current facility-administered medications on file prior to visit.     Allergies:  Allergies  Allergen Reactions   Amoxil [Amoxicillin] Itching   Penicillins Rash    ? Rash as child     Medical History:  Past Medical History:  Diagnosis Date   Anxiety    Diverticulitis 2016   Fracture of fifth metacarpal bone of left hand 09/03/2011   post fall   GERD (gastroesophageal reflux disease)    Osteoarthritis    Family history- Reviewed and unchanged Social history- Reviewed and unchanged    Review of Systems:  Review of Systems  Constitutional:  Negative for malaise/fatigue and weight loss.  HENT:  Negative for hearing loss and tinnitus.   Eyes:  Negative for blurred vision and double vision.  Respiratory:  Positive for cough (nonproductive dry cough). Negative for shortness of breath and wheezing.   Cardiovascular:  Negative for chest pain, palpitations, orthopnea,  claudication and leg swelling.  Gastrointestinal:  Negative for abdominal pain, blood in stool, constipation, diarrhea, heartburn, melena, nausea and vomiting.  Genitourinary: Negative.   Musculoskeletal:  Negative for joint pain and myalgias.  Skin:  Negative for rash.  Neurological:  Negative for dizziness, tingling, sensory change, weakness and headaches.  Endo/Heme/Allergies:  Negative for polydipsia.  Psychiatric/Behavioral:  The patient is nervous/anxious.   All other systems reviewed and are negative.  Physical Exam: BP 110/64    Pulse 93    Temp (!) 97.5 F (36.4 C)    Wt 137 lb 12.8 oz (62.5 kg)    SpO2 97%    BMI 21.58 kg/m  Wt Readings from Last 3 Encounters:  06/18/21 137 lb 12.8 oz (62.5 kg)  05/07/21 138 lb (62.6 kg)  12/31/20 139 lb 9.6 oz (63.3 kg)   General Appearance: Well nourished, in no apparent distress. Eyes: PERRLA, EOMs, conjunctiva no swelling or erythema Sinuses: No Frontal/maxillary tenderness ENT/Mouth: Ext aud canals  clear, TMs without erythema, bulging. No erythema, swelling, or exudate on post pharynx.  Tonsils not swollen or erythematous. Hearing normal.  Neck: Supple, thyroid normal.  Respiratory: Respiratory effort normal, BS equal bilaterally without rales, rhonchi, wheezing or stridor.  Cardio: RRR with no MRGs. Brisk peripheral pulses without edema.  Abdomen: Soft, + BS.  Non tender, no guarding, rebound, hernias, masses. Lymphatics: Non tender without lymphadenopathy.  Musculoskeletal: Full ROM, 5/5 strength, Normal gait Skin: Warm, dry without rashes, lesions, ecchymosis.  Neuro: Cranial nerves intact. No cerebellar symptoms.  Psych: Awake and oriented X 3, normal affect, Insight and Judgment appropriate.    Revonda Humphrey, NP 10:50 AM [facility]

## 2021-06-18 ENCOUNTER — Encounter: Payer: Self-pay | Admitting: Nurse Practitioner

## 2021-06-18 ENCOUNTER — Other Ambulatory Visit: Payer: Self-pay

## 2021-06-18 ENCOUNTER — Ambulatory Visit (INDEPENDENT_AMBULATORY_CARE_PROVIDER_SITE_OTHER): Payer: 59 | Admitting: Nurse Practitioner

## 2021-06-18 VITALS — BP 110/64 | HR 93 | Temp 97.5°F | Wt 137.8 lb

## 2021-06-18 DIAGNOSIS — R059 Cough, unspecified: Secondary | ICD-10-CM

## 2021-06-18 DIAGNOSIS — R030 Elevated blood-pressure reading, without diagnosis of hypertension: Secondary | ICD-10-CM | POA: Diagnosis not present

## 2021-06-18 DIAGNOSIS — E611 Iron deficiency: Secondary | ICD-10-CM | POA: Diagnosis not present

## 2021-06-18 DIAGNOSIS — K219 Gastro-esophageal reflux disease without esophagitis: Secondary | ICD-10-CM

## 2021-06-18 DIAGNOSIS — E782 Mixed hyperlipidemia: Secondary | ICD-10-CM

## 2021-06-18 DIAGNOSIS — R7309 Other abnormal glucose: Secondary | ICD-10-CM

## 2021-06-18 DIAGNOSIS — E039 Hypothyroidism, unspecified: Secondary | ICD-10-CM | POA: Diagnosis not present

## 2021-06-18 DIAGNOSIS — Z79899 Other long term (current) drug therapy: Secondary | ICD-10-CM

## 2021-06-18 DIAGNOSIS — Z23 Encounter for immunization: Secondary | ICD-10-CM | POA: Diagnosis not present

## 2021-06-18 DIAGNOSIS — E559 Vitamin D deficiency, unspecified: Secondary | ICD-10-CM

## 2021-06-18 MED ORDER — BENZONATATE 100 MG PO CAPS: 100.0000 mg | ORAL_CAPSULE | Freq: Four times a day (QID) | ORAL | 1 refills | Status: DC | PRN

## 2021-06-18 NOTE — Patient Instructions (Signed)

## 2021-06-19 LAB — CBC WITH DIFFERENTIAL/PLATELET
Absolute Monocytes: 299 cells/uL (ref 200–950)
Basophils Absolute: 0 cells/uL (ref 0–200)
Basophils Relative: 0 %
Eosinophils Absolute: 40 cells/uL (ref 15–500)
Eosinophils Relative: 0.9 %
HCT: 35.3 % (ref 35.0–45.0)
Hemoglobin: 11.7 g/dL (ref 11.7–15.5)
Lymphs Abs: 1302 cells/uL (ref 850–3900)
MCH: 30.4 pg (ref 27.0–33.0)
MCHC: 33.1 g/dL (ref 32.0–36.0)
MCV: 91.7 fL (ref 80.0–100.0)
MPV: 12.1 fL (ref 7.5–12.5)
Monocytes Relative: 6.8 %
Neutro Abs: 2759 cells/uL (ref 1500–7800)
Neutrophils Relative %: 62.7 %
Platelets: 218 10*3/uL (ref 140–400)
RBC: 3.85 10*6/uL (ref 3.80–5.10)
RDW: 12.9 % (ref 11.0–15.0)
Total Lymphocyte: 29.6 %
WBC: 4.4 10*3/uL (ref 3.8–10.8)

## 2021-06-19 LAB — COMPLETE METABOLIC PANEL WITH GFR
AG Ratio: 1.8 (calc) (ref 1.0–2.5)
ALT: 12 U/L (ref 6–29)
AST: 20 U/L (ref 10–35)
Albumin: 4.3 g/dL (ref 3.6–5.1)
Alkaline phosphatase (APISO): 69 U/L (ref 37–153)
BUN: 12 mg/dL (ref 7–25)
CO2: 28 mmol/L (ref 20–32)
Calcium: 9.7 mg/dL (ref 8.6–10.4)
Chloride: 105 mmol/L (ref 98–110)
Creat: 0.92 mg/dL (ref 0.50–1.05)
Globulin: 2.4 g/dL (calc) (ref 1.9–3.7)
Glucose, Bld: 80 mg/dL (ref 65–99)
Potassium: 5.1 mmol/L (ref 3.5–5.3)
Sodium: 141 mmol/L (ref 135–146)
Total Bilirubin: 0.4 mg/dL (ref 0.2–1.2)
Total Protein: 6.7 g/dL (ref 6.1–8.1)
eGFR: 70 mL/min/{1.73_m2} (ref >=60)

## 2021-06-19 LAB — IRON, TOTAL/TOTAL IRON BINDING CAP
%SAT: 29 % (calc) (ref 16–45)
Iron: 90 ug/dL (ref 45–160)
TIBC: 313 mcg/dL (calc) (ref 250–450)

## 2021-06-19 LAB — TSH: TSH: 3.26 mIU/L (ref 0.40–4.50)

## 2021-06-19 LAB — HEMOGLOBIN A1C
Hgb A1c MFr Bld: 5.6 % of total Hgb (ref <5.7)
Mean Plasma Glucose: 114 mg/dL
eAG (mmol/L): 6.3 mmol/L

## 2021-06-19 LAB — LIPID PANEL
Cholesterol: 140 mg/dL (ref <200)
HDL: 53 mg/dL (ref >=50)
LDL Cholesterol (Calc): 67 mg/dL (calc)
Non-HDL Cholesterol (Calc): 87 mg/dL (calc) (ref <130)
Total CHOL/HDL Ratio: 2.6 (calc) (ref <5.0)
Triglycerides: 118 mg/dL (ref <150)

## 2021-09-23 ENCOUNTER — Other Ambulatory Visit: Payer: Self-pay | Admitting: Internal Medicine

## 2021-09-23 DIAGNOSIS — K2100 Gastro-esophageal reflux disease with esophagitis, without bleeding: Secondary | ICD-10-CM

## 2021-10-08 ENCOUNTER — Encounter: Payer: Self-pay | Admitting: Nurse Practitioner

## 2021-10-08 ENCOUNTER — Ambulatory Visit (INDEPENDENT_AMBULATORY_CARE_PROVIDER_SITE_OTHER): Payer: Managed Care, Other (non HMO) | Admitting: Nurse Practitioner

## 2021-10-08 VITALS — BP 102/60 | HR 83 | Temp 97.5°F | Wt 141.4 lb

## 2021-10-08 DIAGNOSIS — K219 Gastro-esophageal reflux disease without esophagitis: Secondary | ICD-10-CM

## 2021-10-08 DIAGNOSIS — K5792 Diverticulitis of intestine, part unspecified, without perforation or abscess without bleeding: Secondary | ICD-10-CM | POA: Diagnosis not present

## 2021-10-08 MED ORDER — METRONIDAZOLE 500 MG PO TABS: 500.0000 mg | ORAL_TABLET | Freq: Three times a day (TID) | ORAL | 0 refills | Status: AC

## 2021-10-08 MED ORDER — CIPROFLOXACIN HCL 500 MG PO TABS: 500.0000 mg | ORAL_TABLET | Freq: Two times a day (BID) | ORAL | 0 refills | Status: AC

## 2021-10-08 NOTE — Progress Notes (Signed)
Assessment and Plan: ? ?Jaidan was seen today for acute visit. ? ?Diagnoses and all orders for this visit: ? ?Diverticulitis ?-     ciprofloxacin (CIPRO) 500 MG tablet; Take 1 tablet (500 mg total) by mouth 2 (two) times daily for 10 days. ?-     metroNIDAZOLE (FLAGYL) 500 MG tablet; Take 1 tablet (500 mg total) by mouth 3 (three) times daily for 10 days. ?Have pt follow liquid diet/soft food for the next 5-7 days ?If develops fever, increased abdominal pain, nausea and vomiting she is to go to the ER ?If not improved by time she returns from the beach she is to notify the office ?  ? ?GERD ?Patient advised to avoid spicy, acidic, citrus, chocolate, mints, fruit and fruit juices.  Limit the intake of caffeine, alcohol and Soda.  Don't exercise too soon after eating.  Don't lie down within 3-4 hours of eating.  Elevate the head of your bed.  ?Continue Prilosec and Pepcid ? ? ? ? ?Further disposition pending results of labs. Discussed med's effects and SE's.   ?Over 30 minutes of exam, counseling, chart review, and critical decision making was performed.  ? ?Future Appointments  ?Date Time Provider Department Center  ?12/14/2021 10:00 AM Lucky Cowboy, MD [facility] None  ? ? ?------------------------------------------------------------------------------------------------------------------ ? ? ?HPI ?BP 102/60   Pulse 83   Temp (!) 97.5 ?F (36.4 ?C)   Wt 141 lb 6.4 oz (64.1 kg)   SpO2 97%   BMI 22.15 kg/m?  ? ?66 y.o.female presents for pain in lower quadrant which she describes as tender but becomes sharp when trying to have a bowel movement.  She has been noticing looser stools, not diarrhea. She had colonoscopy 2020 not due until 2025. She does have history of diverticulitis. Denies nausea, vomiting and myalgias. ? ?GERD symptoms are currently controlled with Prilosec and Famotidine. Also trying to avoid trigger foods.  ?  ? ? ?Past Medical History:  ?Diagnosis Date  ? Anxiety   ? Diverticulitis 2016  ?  Fracture of fifth metacarpal bone of left hand 09/03/2011  ? post fall  ? GERD (gastroesophageal reflux disease)   ? Osteoarthritis   ?  ? ?Allergies  ?Allergen Reactions  ? Amoxil [Amoxicillin] Itching  ? Penicillins Rash  ?  ? Rash as child  ? ? ?Current Outpatient Medications on File Prior to Visit  ?Medication Sig  ? Cholecalciferol (VITAMIN D) 125 MCG (5000 UT) CAPS Take 1 capsule by mouth daily.  ? Cyanocobalamin (B-12) 500 MCG SUBL Place under the tongue daily.  ? cyclobenzaprine (FLEXERIL) 10 MG tablet Take  1/2 to 1 tablet  3 x /day  as needed for Muscle Spasm  ? famotidine (PEPCID) 20 MG tablet Take 20 mg by mouth daily. Prior to dinner. Gets OTC  ? ibuprofen (ADVIL) 200 MG tablet Take 200 mg by mouth every 6 (six) hours as needed.  ? omeprazole (PRILOSEC) 40 MG capsule TAKE 1 CAPSULE BY MOUTH DAILY PRIOR TO BREAKFAST TO PREVENT INDIGESTION AND HEARTBURN  ? rosuvastatin (CRESTOR) 10 MG tablet Take 1 tablet Daily for Cholesterol  ? sertraline (ZOLOFT) 100 MG tablet Take 1 & 1/2 tablet  (150 mg)  Daily for Mood  ? vitamin C (ASCORBIC ACID) 250 MG tablet Take 250 mg by mouth daily. 1 gummy daily.  ? Zinc 50 MG CAPS Take by mouth daily.  ? benzonatate (TESSALON PERLES) 100 MG capsule Take 1 capsule (100 mg total) by mouth every 6 (six) hours as needed  for cough. (Patient not taking: Reported on 10/08/2021)  ? meloxicam (MOBIC) 15 MG tablet TAKE 1 TABLET BY MOUTH DAILY WITH FOOD AS NEEDED FOR PAIN FOR 2 WEEKS. MAY TAKE WITH TYLENOL. DO NOT TAKE WITH ALEVE OR IBUPROFEN (Patient not taking: Reported on 10/08/2021)  ? ?No current facility-administered medications on file prior to visit.  ? ? ?ROS: all negative except above.  ? ?Physical Exam: ? ?BP 102/60   Pulse 83   Temp (!) 97.5 ?F (36.4 ?C)   Wt 141 lb 6.4 oz (64.1 kg)   SpO2 97%   BMI 22.15 kg/m?  ? ?General Appearance: Well nourished, in no apparent distress. ?Eyes: PERRLA, EOMs, conjunctiva no swelling or erythema ?Sinuses: No Frontal/maxillary  tenderness ?ENT/Mouth: Ext aud canals clear, TMs without erythema, bulging. No erythema, swelling, or exudate on post pharynx.  Tonsils not swollen or erythematous. Hearing normal.  ?Neck: Supple, thyroid normal.  ?Respiratory: Respiratory effort normal, BS equal bilaterally without rales, rhonchi, wheezing or stridor.  ?Cardio: RRR with no MRGs. Brisk peripheral pulses without edema.  ?Abdomen: Soft, + BS.  Mild tenderness LLQ ?Lymphatics: Non tender without lymphadenopathy.  ?Musculoskeletal: Full ROM, 5/5 strength, normal gait.  ?Skin: Warm, dry without rashes, lesions, ecchymosis.  ?Neuro: Cranial nerves intact. Normal muscle tone, no cerebellar symptoms. Sensation intact.  ?Psych: Awake and oriented X 3, normal affect, Insight and Judgment appropriate.  ?  ? ?Revonda Humphrey, NP ?11:20 AM ?[facility] ? ?

## 2021-10-08 NOTE — Patient Instructions (Signed)
Diverticulitis Diverticulitis is infection or inflammation of small pouches (diverticula) in the colon that form due to a condition called diverticulosis. Diverticula can trap stool (feces) and bacteria, causing infection and inflammation. Diverticulitis may cause severe stomach pain and diarrhea. It may lead to tissue damage in the colon that causes bleeding or blockage. The diverticula may also burst (rupture) and cause infected stool to enter other areas of the abdomen. What are the causes? This condition is caused by stool becoming trapped in the diverticula, which allows bacteria to grow in the diverticula. This leads to inflammation and infection. What increases the risk? You are more likely to develop this condition if you have diverticulosis. The risk increases if you: Are overweight or obese. Do not get enough exercise. Drink alcohol. Use tobacco products. Eat a diet that has a lot of red meat such as beef, pork, or lamb. Eat a diet that does not include enough fiber. High-fiber foods include fruits, vegetables, beans, nuts, and whole grains. Are over 40 years of age. What are the signs or symptoms? Symptoms of this condition may include: Pain and tenderness in the abdomen. The pain is normally located on the left side of the abdomen, but it may occur in other areas. Fever and chills. Nausea. Vomiting. Cramping. Bloating. Changes in bowel routines. Blood in your stool. How is this diagnosed? This condition is diagnosed based on: Your medical history. A physical exam. Tests to make sure there is nothing else causing your condition. These tests may include: Blood tests. Urine tests. CT scan of the abdomen. How is this treated? Most cases of this condition are mild and can be treated at home. Treatment may include: Taking over-the-counter pain medicines. Following a clear liquid diet. Taking antibiotic medicines by mouth. Resting. More severe cases may need to be treated  at a hospital. Treatment may include: Not eating or drinking. Taking prescription pain medicine. Receiving antibiotic medicines through an IV. Receiving fluids and nutrition through an IV. Surgery. When your condition is under control, your health care provider may recommend that you have a colonoscopy. This is an exam to look at the entire large intestine. During the exam, a lubricated, bendable tube is inserted into the anus and then passed into the rectum, colon, and other parts of the large intestine. A colonoscopy can show how severe your diverticula are and whether something else may be causing your symptoms. Follow these instructions at home: Medicines Take over-the-counter and prescription medicines only as told by your health care provider. These include fiber supplements, probiotics, and stool softeners. If you were prescribed an antibiotic medicine, take it as told by your health care provider. Do not stop taking the antibiotic even if you start to feel better. Ask your health care provider if the medicine prescribed to you requires you to avoid driving or using machinery. Eating and drinking  Follow a full liquid diet or another diet as directed by your health care provider. After your symptoms improve, your health care provider may tell you to change your diet. He or she may recommend that you eat a diet that contains at least 25 grams (25 g) of fiber daily. Fiber makes it easier to pass stool. Healthy sources of fiber include: Berries. One cup contains 4-8 grams of fiber. Beans or lentils. One-half cup contains 5-8 grams of fiber. Green vegetables. One cup contains 4 grams of fiber. Avoid eating red meat. General instructions Do not use any products that contain nicotine or tobacco, such as cigarettes,   e-cigarettes, and chewing tobacco. If you need help quitting, ask your health care provider. Exercise for at least 30 minutes, 3 times each week. You should exercise hard enough to  raise your heart rate and break a sweat. Keep all follow-up visits as told by your health care provider. This is important. You may need to have a colonoscopy. Contact a health care provider if: Your pain does not improve. Your bowel movements do not return to normal. Get help right away if: Your pain gets worse. Your symptoms do not get better with treatment. Your symptoms suddenly get worse. You have a fever. You vomit more than one time. You have stools that are bloody, black, or tarry. Summary Diverticulitis is infection or inflammation of small pouches (diverticula) in the colon that form due to a condition called diverticulosis. Diverticula can trap stool (feces) and bacteria, causing infection and inflammation. You are at higher risk for this condition if you have diverticulosis and you eat a diet that does not include enough fiber. Most cases of this condition are mild and can be treated at home. More severe cases may need to be treated at a hospital. When your condition is under control, your health care provider may recommend that you have an exam called a colonoscopy. This exam can show how severe your diverticula are and whether something else may be causing your symptoms. Keep all follow-up visits as told by your health care provider. This is important. This information is not intended to replace advice given to you by your health care provider. Make sure you discuss any questions you have with your health care provider. Document Revised: 03/26/2019 Document Reviewed: 03/26/2019 [employer] Patient Education  2022 [employer]  

## 2021-10-23 ENCOUNTER — Other Ambulatory Visit: Payer: Self-pay

## 2021-10-23 DIAGNOSIS — K2100 Gastro-esophageal reflux disease with esophagitis, without bleeding: Secondary | ICD-10-CM

## 2021-10-23 MED ORDER — OMEPRAZOLE 40 MG PO CPDR: DELAYED_RELEASE_CAPSULE | ORAL | 3 refills | Status: DC

## 2021-12-01 ENCOUNTER — Other Ambulatory Visit: Payer: Self-pay | Admitting: Internal Medicine

## 2021-12-01 DIAGNOSIS — E782 Mixed hyperlipidemia: Secondary | ICD-10-CM

## 2021-12-14 ENCOUNTER — Encounter: Payer: 59 | Admitting: Internal Medicine

## 2021-12-29 ENCOUNTER — Other Ambulatory Visit: Payer: Self-pay | Admitting: Internal Medicine

## 2021-12-29 DIAGNOSIS — F419 Anxiety disorder, unspecified: Secondary | ICD-10-CM

## 2021-12-29 MED ORDER — SERTRALINE HCL 100 MG PO TABS: ORAL_TABLET | ORAL | 3 refills | Status: DC

## 2021-12-31 ENCOUNTER — Encounter: Payer: Self-pay | Admitting: Internal Medicine

## 2021-12-31 NOTE — Patient Instructions (Signed)

## 2021-12-31 NOTE — Progress Notes (Signed)
Annual Screening/Preventative Visit & Comprehensive Evaluation &  Examination  Future Appointments  Date Time Provider Department  01/01/2022 10:00 AM Kathryn Cowboy, MD [facility]        This very nice 66 y.o. MWF presents for a Screening /Preventative Visit & comprehensive evaluation and management of multiple medical co-morbidities.  Patient has been followed for BP monitoring,  HLD, Prediabetes  and Vitamin D Deficiency. Patient's GERD is controlled on Omeprazole qam/ famotidine qpm.        Patient is monitored proactively for elevated BP.  Patient's BP has been controlled at home and patient denies any cardiac symptoms as chest pain, palpitations, shortness of breath, dizziness or ankle swelling. Today's BP is  120/72.        Patient's hyperlipidemia is controlled with diet  & Rosuvastatin . Last lipids are at goal:  Lab Results  Component Value Date   CHOL 140 06/18/2021   HDL 53 06/18/2021   LDLCALC 67 06/18/2021   TRIG 118 06/18/2021   CHOLHDL 2.6 06/18/2021        Patient has hx/o prediabetes (A1c 5.8% /2019) and patient denies reactive hypoglycemic symptoms, visual blurring, diabetic polys or paresthesias. Last A1c was normal & at goal:  Lab Results  Component Value Date   HGBA1C 5.6 06/18/2021        Last June,   patient had a low level of Vitamin B12 at "396".  Finally, patient has history of Vitamin D Deficiency and last Vitamin D  on 5,000 units daily replacement was at goal :  Lab Results  Component Value Date   VD25OH 66 12/12/2020        Current Outpatient Medications:    Cholecalciferol (VITAMIN D) 125 MCG (5000 UT) CAPS, Take 1 capsule by mouth daily., Disp: , Rfl:    Cyanocobalamin (B-12) 500 MCG SUBL, Place under the tongue daily., Disp: , Rfl:    cyclobenzaprine (FLEXERIL) 10 MG tablet, Take  1/2 to 1 tablet  3 x /day  as needed for Muscle Spasm, Disp: 90 tablet, Rfl: 0   famotidine (PEPCID) 20 MG tablet, Take 20 mg by mouth daily. Prior to  dinner. Gets OTC, Disp: , Rfl:    ibuprofen (ADVIL) 200 MG tablet, Take 200 mg by mouth every 6 (six) hours as needed., Disp: , Rfl:    omeprazole (PRILOSEC) 40 MG capsule, TAKE 1 CAPSULE BY MOUTH DAILY PRIOR TO BREAKFAST TO PREVENT INDIGESTION AND HEARTBURN, Disp: 90 capsule, Rfl: 3   rosuvastatin (CRESTOR) 10 MG tablet, TAKE 1 TABLET BY MOUTH DAILY FOR CHOLESTEROL, Disp: 90 tablet, Rfl: 3   sertraline (ZOLOFT) 100 MG tablet, Take 1 & 1/2 tablet (150 mg)  Daily  for Mood                                             /                                   TAKE                       BY                    MOUTH, Disp: 135 tablet, Rfl: 3   vitamin C (ASCORBIC ACID) 250 MG  tablet, Take 250 mg by mouth daily. 1 gummy daily., Disp: , Rfl:    Zinc 50 MG CAPS, Take by mouth daily., Disp: , Rfl:     Allergies  Allergen Reactions   Amoxil [Amoxicillin] Itching   Penicillins Rash    ? Rash as child    Past Medical History:  Diagnosis Date   Anxiety    Diverticulitis 2016   Fracture of fifth metacarpal bone of left hand 09/03/2011   post fall   GERD (gastroesophageal reflux disease)    Osteoarthritis     Health Maintenance  Topic Date Due   Pneumococcal Vaccine 90-2 Years old (1 - PCV) Never done   Zoster Vaccines- Shingrix (1 of 2) Never done   MAMMOGRAM  07/09/2019   PAP SMEAR-Modifier  07/13/2020   Fecal DNA (Cologuard)  07/15/2020   INFLUENZA VACCINE  01/26/2021   TETANUS/TDAP  04/05/2027   Hepatitis C Screening  Completed   HIV Screening  Completed   HPV VACCINES  Aged Out     Immunization History  Administered Date(s) Administered   Influenza,inj,Quad PF,6+ Mos 04/04/2017   Influenza-Unspecified 04/06/2013, 04/28/2018   PPD Test 12/03/2019   Td 10/03/2006   Tdap 04/04/2017     Last Colon -  Dec 2020 - at [facility] - had a negative  EGD, Capsule Endoscopy & Colonoscopy to evaluate for Iron deficiency (w/u was negative and Low iron most likely due to her  Proilosec)   Last MGM and dexaBMD - 06/2019 at [facility]   Past Surgical History:  Procedure Laterality Date   APPENDECTOMY     COLONOSCOPY     negative; Dr Jarold Motto   ORIF FINGER FRACTURE  09/03/2011   Procedure: OPEN REDUCTION INTERNAL FIXATION (ORIF) METACARPAL (FINGER) FRACTURE;  Surgeon: Eulas Post, MD;  Location: [facility];  Service: Orthopedics;  Laterality: Left;   TUBAL LIGATION       Family History  Problem Relation Age of Onset   Colitis Mother    COPD Mother    Heart attack Mother 11   Heart disease Maternal Grandfather    Breast cancer Other        MGaunt   Thyroid disease Sister    Diabetes Neg Hx    Stroke Neg Hx    Colon cancer Neg Hx    Colon polyps Neg Hx    Kidney disease Neg Hx    Gallbladder disease Neg Hx    Esophageal cancer Neg Hx     Social History   Tobacco Use   Smoking status: Never   Smokeless tobacco: Never  Substance Use Topics   Alcohol use: No    Alcohol/week: 0.0 standard drinks   Drug use: No      ROS  Constitutional: Denies fever, chills, weight loss/gain, headaches, insomnia,  night sweats, and change in appetite. Does c/o fatigue. Eyes: Denies redness, blurred vision, diplopia, discharge, itchy, watery eyes.  ENT: Denies discharge, congestion, post nasal drip, epistaxis, sore throat, earache, hearing loss, dental pain, Tinnitus, Vertigo, Sinus pain, snoring.  Cardio: Denies chest pain, palpitations, irregular heartbeat, syncope, dyspnea, diaphoresis, orthopnea, PND, claudication, edema Respiratory: denies cough, dyspnea, DOE, pleurisy, hoarseness, laryngitis, wheezing.  Gastrointestinal: Denies dysphagia, heartburn, reflux, water brash, pain, cramps, nausea, vomiting, bloating, diarrhea, constipation, hematemesis, melena, hematochezia, jaundice, hemorrhoids Genitourinary: Denies dysuria, frequency, urgency, nocturia, hesitancy, discharge, hematuria, flank pain Breast: Breast lumps, nipple  discharge, bleeding.  Musculoskeletal: Denies arthralgia, myalgia, stiffness, Jt. Swelling, pain, limp, and  strain/sprain. Denies falls. Skin: Denies puritis, rash, hives, warts, acne, eczema, changing in skin lesion Neuro: No weakness, tremor, incoordination, spasms, paresthesia, pain Psychiatric: Denies confusion, memory loss, sensory loss. Denies Depression. Endocrine: Denies change in weight, skin, hair change, nocturia, and paresthesia, diabetic polys, visual blurring, hyper / hypo glycemic episodes.  Heme/Lymph: No excessive bleeding, bruising, enlarged lymph nodes.  Physical Exam  BP 120/72   Pulse 100   Temp 97.9 F (36.6 C)   Resp 16   Ht 5\' 7"  (1.702 m)   Wt 144 lb 9.6 oz (65.6 kg)   SpO2 97%   BMI 22.65 kg/m   General Appearance: Well nourished, well groomed and in no apparent distress.  Eyes: PERRLA, EOMs, conjunctiva no swelling or erythema, normal fundi and vessels. Sinuses: No frontal/maxillary tenderness ENT/Mouth: EACs patent / TMs  nl. Nares clear without erythema, swelling, mucoid exudates. Oral hygiene is good. No erythema, swelling, or exudate. Tongue normal, non-obstructing. Tonsils not swollen or erythematous. Hearing normal.  Neck: Supple, thyroid not palpable. No bruits, nodes or JVD. Respiratory: Respiratory effort normal.  BS equal and clear bilateral without rales, rhonci, wheezing or stridor. Cardio: Heart sounds are normal with regular rate and rhythm and no murmurs, rubs or gallops. Peripheral pulses are normal and equal bilaterally without edema. No aortic or femoral bruits. Chest: symmetric with normal excursions and percussion. Breasts: Symmetric, without lumps, nipple discharge, retractions, or fibrocystic changes.  Abdomen: Flat, soft with bowel sounds active. Nontender, no guarding, rebound, hernias, masses, or organomegaly.  Lymphatics: Non tender without lymphadenopathy.  Genitourinary:  Musculoskeletal: Full ROM all peripheral extremities,  joint stability, 5/5 strength, and normal gait. Skin: Warm and dry without rashes, lesions, cyanosis, clubbing or  ecchymosis.  Neuro: Cranial nerves intact, reflexes equal bilaterally. Normal muscle tone, no cerebellar symptoms. Sensation intact.  Pysch: Alert and oriented X 3, normal affect, Insight and Judgment appropriate.    Assessment and Plan  1. Annual Preventative Screening Examination   2. Elevated BP without diagnosis of hypertension  - EKG 12-Lead - , RETROPERITNL ABD,  LTD - Urinalysis, Routine w reflex microscopic - Microalbumin / creatinine urine ratio - CBC with Differential/Platelet - COMPLETE METABOLIC PANEL WITH GFR - Magnesium - TSH  3. Hyperlipidemia, mixed  - EKG 12-Lead - [place], RETROPERITNL ABD,  LTD - Lipid panel - TSH  4. Abnormal glucose  - EKG 12-Lead - [place], RETROPERITNL ABD,  LTD - Hemoglobin A1c - Insulin, random  5. Vitamin D deficiency  - VITAMIN D 25 Hydroxy   6. Iron deficiency  - Iron, Total/Total Iron Binding Cap  7. B12 deficiency  - Vitamin B12  8. Screening-pulmonary TB  - TB Skin Test  9. Screening for colorectal cancer  - POC Hemoccult Bld/Stl   10. Screening for heart disease  - EKG 12-Lead  11. FHx: heart disease  - EKG 12-Lead - [place], RETROPERITNL ABD,  LTD  12. Screening for AAA (aortic abdominal aneurysm)  - [place], RETROPERITNL ABD,  LTD  13. Fatigue  - Iron, Total/Total Iron Binding Cap - Vitamin B12 - CBC with Differential/Platelet - TSH  14. Medication management  - Urinalysis, Routine w reflex microscopic - Microalbumin / creatinine urine ratio - CBC with Differential/Platelet - COMPLETE METABOLIC PANEL WITH GFR - Magnesium - Lipid panel - TSH - Hemoglobin A1c - Insulin, random - VITAMIN D 25 Hydroxy          Patient was counseled in prudent diet to achieve/maintain BMI less than 25 for weight control,  BP monitoring, regular exercise and medications. Discussed med's effects and SE's.  Screening labs and tests as requested with regular follow-up as recommended. Over 40 minutes of exam, counseling, chart review and high complex critical decision making was performed.   Marinus Maw, MD

## 2022-01-01 ENCOUNTER — Encounter: Payer: Self-pay | Admitting: Internal Medicine

## 2022-01-01 ENCOUNTER — Ambulatory Visit (INDEPENDENT_AMBULATORY_CARE_PROVIDER_SITE_OTHER): Payer: Commercial Managed Care - HMO | Admitting: Internal Medicine

## 2022-01-01 VITALS — BP 120/72 | HR 100 | Temp 97.9°F | Resp 16 | Ht 67.0 in | Wt 144.6 lb

## 2022-01-01 DIAGNOSIS — E538 Deficiency of other specified B group vitamins: Secondary | ICD-10-CM

## 2022-01-01 DIAGNOSIS — Z136 Encounter for screening for cardiovascular disorders: Secondary | ICD-10-CM

## 2022-01-01 DIAGNOSIS — R7309 Other abnormal glucose: Secondary | ICD-10-CM

## 2022-01-01 DIAGNOSIS — Z79899 Other long term (current) drug therapy: Secondary | ICD-10-CM

## 2022-01-01 DIAGNOSIS — Z1211 Encounter for screening for malignant neoplasm of colon: Secondary | ICD-10-CM

## 2022-01-01 DIAGNOSIS — R030 Elevated blood-pressure reading, without diagnosis of hypertension: Secondary | ICD-10-CM

## 2022-01-01 DIAGNOSIS — I700 Atherosclerosis of aorta: Secondary | ICD-10-CM

## 2022-01-01 DIAGNOSIS — R5383 Other fatigue: Secondary | ICD-10-CM

## 2022-01-01 DIAGNOSIS — Z8249 Family history of ischemic heart disease and other diseases of the circulatory system: Secondary | ICD-10-CM | POA: Diagnosis not present

## 2022-01-01 DIAGNOSIS — Z130 Encounter for screening for diseases of the blood and blood-forming organs and certain disorders involving the immune mechanism: Secondary | ICD-10-CM | POA: Diagnosis not present

## 2022-01-01 DIAGNOSIS — Z111 Encounter for screening for respiratory tuberculosis: Secondary | ICD-10-CM | POA: Diagnosis not present

## 2022-01-01 DIAGNOSIS — Z13220 Encounter for screening for lipoid disorders: Secondary | ICD-10-CM

## 2022-01-01 DIAGNOSIS — Z0000 Encounter for general adult medical examination without abnormal findings: Secondary | ICD-10-CM

## 2022-01-01 DIAGNOSIS — Z1389 Encounter for screening for other disorder: Secondary | ICD-10-CM | POA: Diagnosis not present

## 2022-01-01 DIAGNOSIS — Z131 Encounter for screening for diabetes mellitus: Secondary | ICD-10-CM | POA: Diagnosis not present

## 2022-01-01 DIAGNOSIS — E611 Iron deficiency: Secondary | ICD-10-CM

## 2022-01-01 DIAGNOSIS — E559 Vitamin D deficiency, unspecified: Secondary | ICD-10-CM | POA: Diagnosis not present

## 2022-01-01 DIAGNOSIS — E782 Mixed hyperlipidemia: Secondary | ICD-10-CM

## 2022-01-01 DIAGNOSIS — Z0001 Encounter for general adult medical examination with abnormal findings: Secondary | ICD-10-CM

## 2022-01-03 NOTE — Progress Notes (Signed)
<><><><><><><><><><><><><><><><><><><><><><><><><><><><><><><><><> <><><><><><><><><><><><><><><><><><><><><><><><><><><><><><><><><> - Test results slightly outside the reference range are not unusual. If there is anything important, I will review this with you,  otherwise it is considered normal test values.  If you have further questions,  please do not hesitate to contact me at the office or via My Chart.  <><><><><><><><><><><><><><><><><><><><><><><><><><><><><><><><><> <><><><><><><><><><><><><><><><><><><><><><><><><><><><><><><><><>  -  Total  Chol =  150    - Excellent             (  Ideal  or  Goal is less than 180  !  )  & -  Bad / Dangerous LDL  Chol =  69  - Also Excellent              (  Ideal  or  Goal is less than 70  !  )    - Both  Excellent   - Very low risk for Heart Attack  / Stroke <><><><><><><><><><><><><><><><><><><><><><><><><><><><><><><><><> <><><><><><><><><><><><><><><><><><><><><><><><><><><><><><><><><>  - Also Triglycerides (  201   ) or fats in blood are too high                 (   Ideal or  Goal is less than 150  !  )    - Recommend avoid fried & greasy foods,  sweets / candy,   - Avoid white rice  (brown or wild rice or Quinoa is OK),   - Avoid white potatoes  (sweet potatoes are OK)   - Avoid anything made from white flour  - bagels, doughnuts, rolls, buns, biscuits, white and   wheat breads, pizza crust and traditional  pasta made of white flour & egg white  - (vegetarian pasta or spinach or wheat pasta is OK).    - Multi-grain bread is OK - like multi-grain flat bread or  sandwich thins.   - Avoid alcohol in excess.   - Exercise is also important. <><><><><><><><><><><><><><><><><><><><><><><><><><><><><><><><><> <><><><><><><><><><><><><><><><><><><><><><><><><><><><><><><><><>  - TSH / Thyroid is slightly out of range - but not enough to make changes in meds                                                                    - So will continue close monitoring at next OV  <><><><><><><><><><><><><><><><><><><><><><><><><><><><><><><><><> <><><><><><><><><><><><><><><><><><><><><><><><><><><><><><><><><>  - Iron & Vitamin B12 levels both Normal & OK  <><><><><><><><><><><><><><><><><><><><><><><><><><><><><><><><><> <><><><><><><><><><><><><><><><><><><><><><><><><><><><><><><><><>  - A1c - Normal - no Diabetes  -          Great !  <><><><><><><><><><><><><><><><><><><><><><><><><><><><><><><><><> <><><><><><><><><><><><><><><><><><><><><><><><><><><><><><><><><>  - Vitamin V = 50 - OK , But slightly Low   - Vitamin D goal is between 70-100.   - Please INCREASE  Vitamin D 5,000 unit capsules up to                                                                                             2 capsules = 10,000 units Daily   -  It is very important as a natural anti-inflammatory and helping the                                          immune system protect against viral infections, like the Covid-19    helping hair, skin, and nails, as well as reducing stroke and heart attack risk.   - It helps your bones and helps with mood.  - It also decreases numerous cancer risks so please . . . . . . . . . . . . . . . .take it as directed.   - Low Vit D is associated with a 200-300% higher risk for        CANCER   and 200-300% higher risk for HEART   ATTACK  &  STROKE.    - It is also associated with higher death rate at younger ages,   autoimmune diseases like Rheumatoid arthritis, Lupus,  Multiple Sclerosis.     - Also many other serious conditions, like depression, Alzheimer's  Dementia, infertility, muscle aches, fatigue, fibromyalgia   - just to name a few. <><><><><><><><><><><><><><><><><><><><><><><><><><><><><><><><><> <><><><><><><><><><><><><><><><><><><><><><><><><><><><><><><><><>  - All Else - CBC - Kidneys - Electrolytes - Liver - Magnesium & Thyroid    - all  Normal /  OK <><><><><><><><><><><><><><><><><><><><><><><><><><><><><><><><><> <><><><><><><><><><><><><><><><><><><><><><><><><><><><><><><><><>  - Keep up the [place] Work  !  <><><><><><><><><><><><><><><><><><><><><><><><><><><><><><><><><> <><><><><><><><><><><><><><><><><><><><><><><><><><><><><><><><><>              ]

## 2022-01-04 LAB — CBC WITH DIFFERENTIAL/PLATELET
Absolute Monocytes: 269 cells/uL (ref 200–950)
Basophils Absolute: 0 cells/uL (ref 0–200)
Basophils Relative: 0 %
Eosinophils Absolute: 38 cells/uL (ref 15–500)
Eosinophils Relative: 0.8 %
HCT: 37.7 % (ref 35.0–45.0)
Hemoglobin: 12.8 g/dL (ref 11.7–15.5)
Lymphs Abs: 1358 cells/uL (ref 850–3900)
MCH: 30.7 pg (ref 27.0–33.0)
MCHC: 34 g/dL (ref 32.0–36.0)
MCV: 90.4 fL (ref 80.0–100.0)
MPV: 12.3 fL (ref 7.5–12.5)
Monocytes Relative: 5.6 %
Neutro Abs: 3134 cells/uL (ref 1500–7800)
Neutrophils Relative %: 65.3 %
Platelets: 200 10*3/uL (ref 140–400)
RBC: 4.17 10*6/uL (ref 3.80–5.10)
RDW: 12.8 % (ref 11.0–15.0)
Total Lymphocyte: 28.3 %
WBC: 4.8 10*3/uL (ref 3.8–10.8)

## 2022-01-04 LAB — COMPLETE METABOLIC PANEL WITH GFR
AG Ratio: 1.8 (calc) (ref 1.0–2.5)
ALT: 11 U/L (ref 6–29)
AST: 23 U/L (ref 10–35)
Albumin: 4.4 g/dL (ref 3.6–5.1)
Alkaline phosphatase (APISO): 69 U/L (ref 37–153)
BUN: 13 mg/dL (ref 7–25)
CO2: 30 mmol/L (ref 20–32)
Calcium: 9.4 mg/dL (ref 8.6–10.4)
Chloride: 104 mmol/L (ref 98–110)
Creat: 0.74 mg/dL (ref 0.50–1.05)
Globulin: 2.4 g/dL (calc) (ref 1.9–3.7)
Glucose, Bld: 88 mg/dL (ref 65–99)
Potassium: 4.9 mmol/L (ref 3.5–5.3)
Sodium: 141 mmol/L (ref 135–146)
Total Bilirubin: 0.5 mg/dL (ref 0.2–1.2)
Total Protein: 6.8 g/dL (ref 6.1–8.1)
eGFR: 90 mL/min/{1.73_m2} (ref >=60)

## 2022-01-04 LAB — URINALYSIS, ROUTINE W REFLEX MICROSCOPIC
Bilirubin Urine: NEGATIVE
Glucose, UA: NEGATIVE
Hgb urine dipstick: NEGATIVE
Ketones, ur: NEGATIVE
Leukocytes,Ua: NEGATIVE
Nitrite: NEGATIVE
Protein, ur: NEGATIVE
Specific Gravity, Urine: 1.006 (ref 1.001–1.035)
pH: 7.5 (ref 5.0–8.0)

## 2022-01-04 LAB — HEMOGLOBIN A1C
Hgb A1c MFr Bld: 5.4 % of total Hgb (ref <5.7)
Mean Plasma Glucose: 108 mg/dL
eAG (mmol/L): 6 mmol/L

## 2022-01-04 LAB — TSH: TSH: 6.51 mIU/L — ABNORMAL HIGH (ref 0.40–4.50)

## 2022-01-04 LAB — VITAMIN D 25 HYDROXY (VIT D DEFICIENCY, FRACTURES): Vit D, 25-Hydroxy: 50 ng/mL (ref 30–100)

## 2022-01-04 LAB — INSULIN, RANDOM: Insulin: 3.5 u[IU]/mL

## 2022-01-04 LAB — LIPID PANEL
Cholesterol: 150 mg/dL (ref <200)
HDL: 53 mg/dL (ref >=50)
LDL Cholesterol (Calc): 69 mg/dL (calc)
Non-HDL Cholesterol (Calc): 97 mg/dL (calc) (ref <130)
Total CHOL/HDL Ratio: 2.8 (calc) (ref <5.0)
Triglycerides: 201 mg/dL — ABNORMAL HIGH (ref <150)

## 2022-01-04 LAB — IRON, TOTAL/TOTAL IRON BINDING CAP
%SAT: 33 % (calc) (ref 16–45)
Iron: 107 ug/dL (ref 45–160)
TIBC: 326 mcg/dL (calc) (ref 250–450)

## 2022-01-04 LAB — MICROALBUMIN / CREATININE URINE RATIO
Creatinine, Urine: 19 mg/dL — ABNORMAL LOW (ref 20–275)
Microalb, Ur: <0.2 mg/dL

## 2022-01-04 LAB — VITAMIN B12: Vitamin B-12: 567 pg/mL (ref 200–1100)

## 2022-01-04 LAB — MAGNESIUM: Magnesium: 2.4 mg/dL (ref 1.5–2.5)

## 2022-01-29 DIAGNOSIS — E7800 Pure hypercholesterolemia, unspecified: Secondary | ICD-10-CM | POA: Insufficient documentation

## 2022-03-03 IMAGING — CR DG ANKLE COMPLETE 3+V*L*
3 series · 3 of 3 positions shown · non-contrast
Comparison: None.

CLINICAL DATA: Left lateral ankle pain and swelling for 6 days.

EXAM:
LEFT ANKLE COMPLETE - 3+ VIEW

[x ankle ap left]
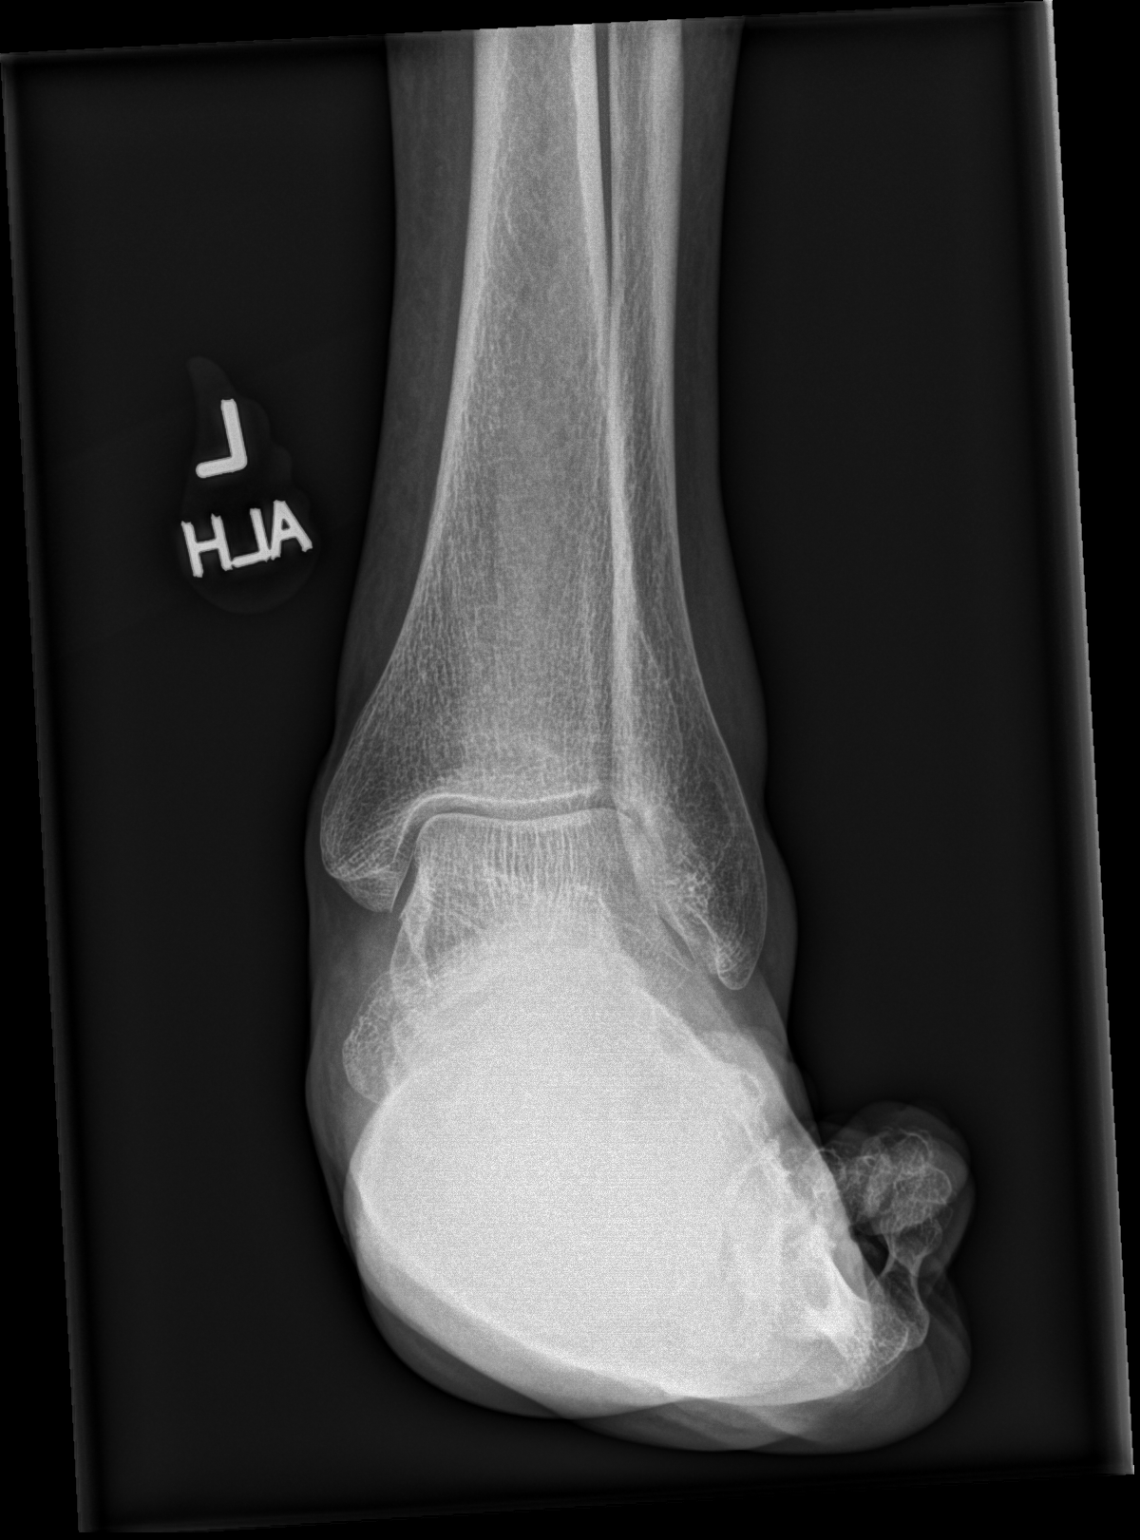

[x ankle obl left]
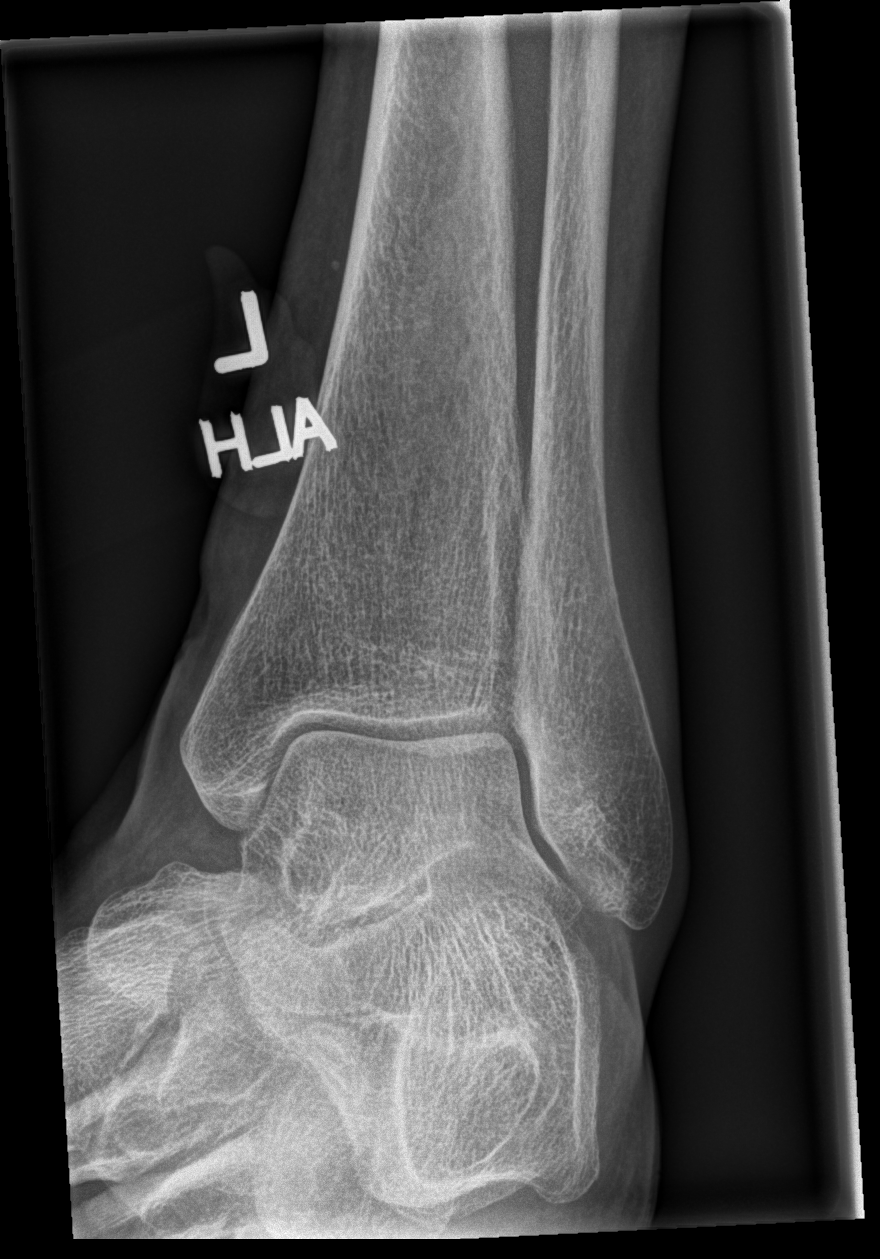

[x ankle lat left]
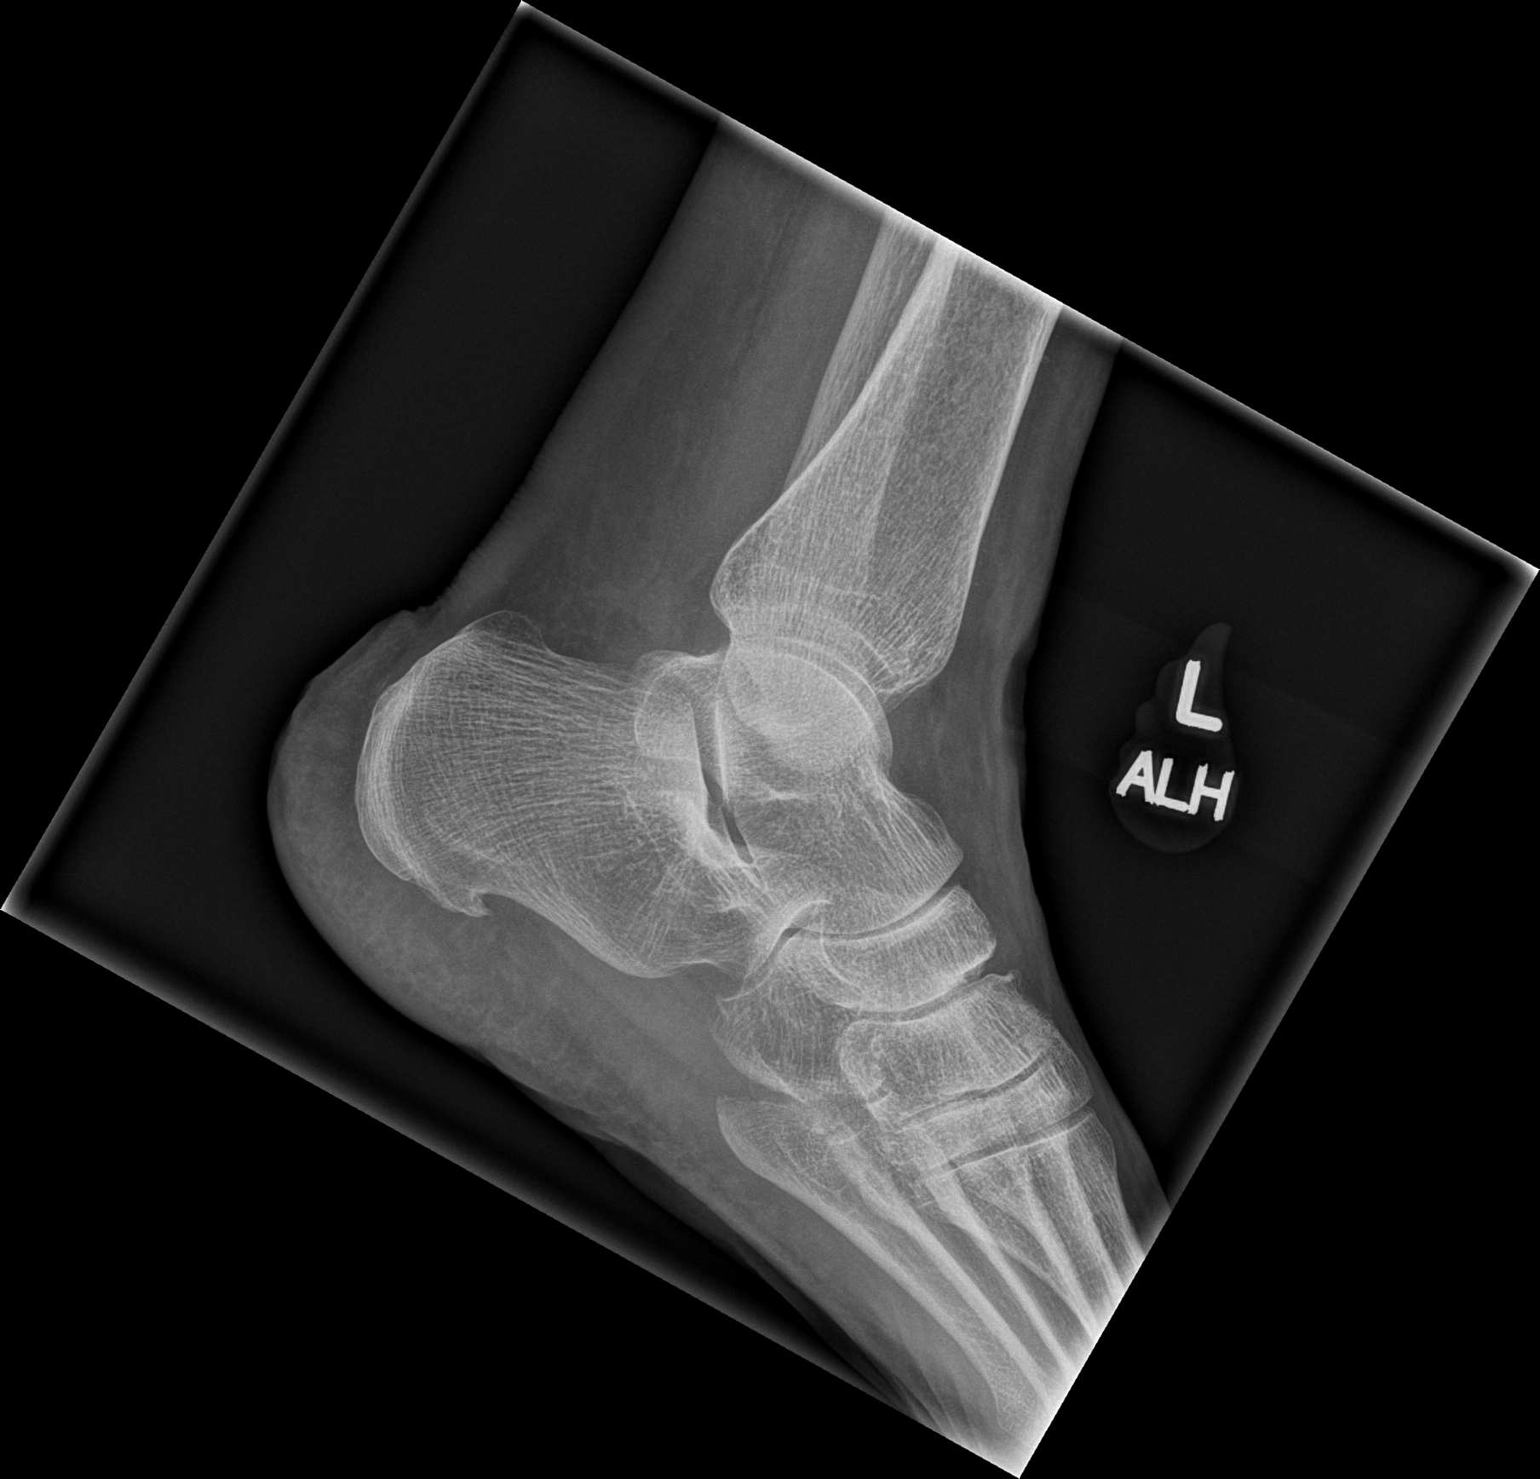

[3 of 3 positions shown; findings below may reference images not displayed]

FINDINGS: No acute fracture or dislocation is identified. There is a small
plantar calcaneal enthesophyte. Minimal dorsal spurring is noted
along the midfoot. The soft tissues are unremarkable.
IMPRESSION: No acute osseous abnormality identified.

## 2022-03-09 NOTE — Progress Notes (Unsigned)
Assessment and Plan:  Kathryn [name] was seen today for acute visit.  Diagnoses and all orders for this visit:  Esophagitis Continue Prilosec and begin carafate before each meal and at bedtime x 7 days If no improvement notify the office and will refer to GI -     sucralfate (CARAFATE) 1 g tablet; Take 1 tablet (1 g total) by mouth 4 (four) times daily.  Diarrhea of presumed infectious origin Begin BRATY diet Push fluids Try to do relaxation techniques to control stress and continue Zoloft Take Immodium q 6 hours as needed Will obtain stool cultures If all are negative and symptoms persist will refer to GI -     Gastrointestinal Pathogen Pnl RT, PCR; Future -     C. difficile GDH and Toxin A/B; Future -     Giardia antigen; Future -     Cryptosporidium Antigen, EIA; Future -     CBC with Differential/Platelet -     COMPLETE METABOLIC PANEL WITH GFR       Further disposition pending results of labs. Discussed med's effects and SE's.   Over 30 minutes of exam, counseling, chart review, and critical decision making was performed.   Future Appointments  Date Time Provider Department Center  04/13/2022  9:30 AM Raynelle Dick, NP [facility] None  07/19/2022  9:30 AM Lucky Cowboy, MD [facility] None  01/21/2023 10:00 AM Lucky Cowboy, MD [facility] None    ------------------------------------------------------------------------------------------------------------------   HPI BP 116/62   Pulse 88   Temp (!) 97.5 F (36.4 C)   Ht 5\' 7"  (1.702 m)   Wt 141 lb (64 kg)   SpO2 97%   BMI 22.08 kg/m   66 y.o.female presents for diarrhea, #6 or 7 on the stool chart.  Diarrhea is occurring every day or every other day. Can be multiple times in a day or occur every other day.  Food intake does not precipitate.  Denies abdominal cramping, fever, blood/mucus in stool. Has not been on any trips. She has noted more epigastric pain/reflux- takes Prilosec daily.  She did have increased  stress- found out her aunt has cancer.  She has started on a probiotic and is taking activia yogurt daily. Symptoms began 4 weeks ago.  She has not tried any Immodium.    Past Medical History:  Diagnosis Date   Anxiety    Diverticulitis 2016   Fracture of fifth metacarpal bone of left hand 09/03/2011   post fall   GERD (gastroesophageal reflux disease)    Osteoarthritis      Allergies  Allergen Reactions   Amoxil [Amoxicillin] Itching   Penicillins Rash    ? Rash as child    Current Outpatient Medications on File Prior to Visit  Medication Sig   Cholecalciferol (VITAMIN D) 125 MCG (5000 UT) CAPS Take 1 capsule by mouth daily.   Cyanocobalamin (B-12) 500 MCG SUBL Place under the tongue daily.   cyclobenzaprine (FLEXERIL) 10 MG tablet Take  1/2 to 1 tablet  3 x /day  as needed for Muscle Spasm   famotidine (PEPCID) 20 MG tablet Take 20 mg by mouth daily. Prior to dinner. Gets OTC   ibuprofen (ADVIL) 200 MG tablet Take 200 mg by mouth every 6 (six) hours as needed.   Multiple Vitamin (MULTIVITAMIN) tablet Take 1 tablet by mouth daily.   Multiple Vitamins-Minerals (HAIR SKIN AND NAILS FORMULA PO) Take by mouth.   omeprazole (PRILOSEC) 40 MG capsule TAKE 1 CAPSULE BY MOUTH DAILY PRIOR TO BREAKFAST TO  PREVENT INDIGESTION AND HEARTBURN   Probiotic Product (PROBIOTIC PO) Take by mouth.   rosuvastatin (CRESTOR) 10 MG tablet TAKE 1 TABLET BY MOUTH DAILY FOR CHOLESTEROL   sertraline (ZOLOFT) 100 MG tablet Take 1 & 1/2 tablet (150 mg)  Daily  for Mood                                             /                                   TAKE                       BY                    MOUTH   vitamin C (ASCORBIC ACID) 250 MG tablet Take 250 mg by mouth daily. 1 gummy daily.   Zinc 50 MG CAPS Take by mouth daily. Couple days a week   No current facility-administered medications on file prior to visit.    ROS: all negative except above.   Physical Exam:  BP 116/62   Pulse 88   Temp (!) 97.5 F  (36.4 C)   Ht 5\' 7"  (1.702 m)   Wt 141 lb (64 kg)   SpO2 97%   BMI 22.08 kg/m   General Appearance: Well nourished, in no apparent distress. Eyes: PERRLA, EOMs, conjunctiva no swelling or erythema Sinuses: No Frontal/maxillary tenderness ENT/Mouth: Ext aud canals clear, TMs without erythema, bulging. No erythema, swelling, or exudate on post pharynx.  Tonsils not swollen or erythematous. Hearing normal.  Neck: Supple, thyroid normal.  Respiratory: Respiratory effort normal, BS equal bilaterally without rales, rhonchi, wheezing or stridor.  Cardio: RRR with no MRGs. Brisk peripheral pulses without edema.  Abdomen: Soft, + BS.  Non tender, no guarding, rebound, hernias, masses. Lymphatics: Non tender without lymphadenopathy.  Musculoskeletal: Full ROM, 5/5 strength, normal gait.  Skin: Warm, dry without rashes, lesions, ecchymosis.  Neuro: Cranial nerves intact. Normal muscle tone, no cerebellar symptoms. Sensation intact.  Psych: Awake and oriented X 3, normal affect, Insight and Judgment appropriate.     , NP 1:53 PM [facility]

## 2022-03-10 ENCOUNTER — Encounter: Payer: Self-pay | Admitting: Nurse Practitioner

## 2022-03-10 ENCOUNTER — Other Ambulatory Visit: Payer: Self-pay

## 2022-03-10 ENCOUNTER — Ambulatory Visit: Payer: Commercial Managed Care - HMO | Admitting: Nurse Practitioner

## 2022-03-10 VITALS — BP 116/62 | HR 88 | Temp 97.5°F | Ht 67.0 in | Wt 141.0 lb

## 2022-03-10 DIAGNOSIS — K209 Esophagitis, unspecified without bleeding: Secondary | ICD-10-CM | POA: Diagnosis not present

## 2022-03-10 DIAGNOSIS — R197 Diarrhea, unspecified: Secondary | ICD-10-CM | POA: Diagnosis not present

## 2022-03-10 MED ORDER — SUCRALFATE 1 G PO TABS: 1.0000 g | ORAL_TABLET | Freq: Four times a day (QID) | ORAL | 1 refills | Status: DC

## 2022-03-10 NOTE — Patient Instructions (Addendum)
Immodium 2 mg every 6 hours as needed- get over the counter  Ordering stool cultures Continue probiotic and activia  Take Carafate before each meal and at bedtime x 7 days  Diarrhea, Adult Diarrhea is when you pass loose and watery poop (stool) often. Diarrhea can make you feel weak and cause you to lose water in your body (get dehydrated). Losing water in your body can cause you to: Feel tired and thirsty. Have a dry mouth. Go pee (urinate) less often. Diarrhea often lasts 2-3 days. However, it can last longer if it is a sign of something more serious. It is important to treat your diarrhea as told by your doctor. Follow these instructions at home: Eating and drinking     Follow these instructions as told by your doctor: Take an ORS (oral rehydration solution). This is a drink that helps you replace fluids and minerals your body lost. It is sold at pharmacies and stores. Drink plenty of fluids, such as: Water. Ice chips. Diluted fruit juice. Low-calorie sports drinks. Milk, if you want. Avoid drinking fluids that have a lot of sugar or caffeine in them. Eat bland, easy-to-digest foods in small amounts as you are able. These foods include: Bananas. Applesauce. Rice. Low-fat (lean) meats. Toast. Crackers. Avoid alcohol. Avoid spicy or fatty foods.  Medicines Take over-the-counter and prescription medicines only as told by your doctor. If you were prescribed an antibiotic medicine, take it as told by your doctor. Do not stop using the antibiotic even if you start to feel better. General instructions  Wash your hands often using soap and water. If soap and water are not available, use a hand sanitizer. Others in your home should wash their hands as well. Hands should be washed: After using the toilet or changing a diaper. Before preparing, cooking, or serving food. While caring for a sick person. While visiting someone in a hospital. Drink enough fluid to keep your pee  (urine) pale yellow. Rest at home while you get better. Take a warm bath to help with any burning or pain from having diarrhea. Watch your condition for any changes. Keep all follow-up visits as told by your doctor. This is important. Contact a doctor if: You have a fever. Your diarrhea gets worse. You have new symptoms. You cannot keep fluids down. You feel light-headed or dizzy. You have a headache. You have muscle cramps. Get help right away if: You have chest pain. You feel very weak or you pass out (faint). You have bloody or black poop or poop that looks like tar. You have very bad pain, cramping, or bloating in your belly (abdomen). You have trouble breathing or you are breathing very quickly. Your heart is beating very quickly. Your skin feels cold and clammy. You feel confused. You have signs of losing too much water in your body, such as: Dark pee, very little pee, or no pee. Cracked lips. Dry mouth. Sunken eyes. Sleepiness. Weakness. Summary Diarrhea is when you pass loose and watery poop (stool) often. Diarrhea can make you feel weak and cause you to lose water in your body (get dehydrated). Take an ORS (oral rehydration solution). This is a drink that is sold at pharmacies and stores. Eat bland, easy-to-digest foods in small amounts as you are able. Contact a doctor if your condition gets worse. Get help right away if you have signs that you have lost too much water in your body. This information is not intended to replace advice given to you  by your health care provider. Make sure you discuss any questions you have with your health care provider. Document Revised: 09/04/2021 Document Reviewed: 12/24/2020 Elsevier Patient Education  2023 [employer].

## 2022-03-11 LAB — COMPLETE METABOLIC PANEL WITH GFR
AG Ratio: 1.8 (calc) (ref 1.0–2.5)
ALT: 11 U/L (ref 6–29)
AST: 18 U/L (ref 10–35)
Albumin: 4.3 g/dL (ref 3.6–5.1)
Alkaline phosphatase (APISO): 67 U/L (ref 37–153)
BUN: 12 mg/dL (ref 7–25)
CO2: 29 mmol/L (ref 20–32)
Calcium: 9.7 mg/dL (ref 8.6–10.4)
Chloride: 104 mmol/L (ref 98–110)
Creat: 0.89 mg/dL (ref 0.50–1.05)
Globulin: 2.4 g/dL (calc) (ref 1.9–3.7)
Glucose, Bld: 80 mg/dL (ref 65–99)
Potassium: 5.1 mmol/L (ref 3.5–5.3)
Sodium: 141 mmol/L (ref 135–146)
Total Bilirubin: 0.4 mg/dL (ref 0.2–1.2)
Total Protein: 6.7 g/dL (ref 6.1–8.1)
eGFR: 72 mL/min/{1.73_m2} (ref >=60)

## 2022-03-11 LAB — CBC WITH DIFFERENTIAL/PLATELET
Absolute Monocytes: 329 cells/uL (ref 200–950)
Basophils Absolute: 0 cells/uL (ref 0–200)
Basophils Relative: 0 %
Eosinophils Absolute: 21 cells/uL (ref 15–500)
Eosinophils Relative: 0.4 %
HCT: 35.9 % (ref 35.0–45.0)
Hemoglobin: 12 g/dL (ref 11.7–15.5)
Lymphs Abs: 1569 cells/uL (ref 850–3900)
MCH: 30.5 pg (ref 27.0–33.0)
MCHC: 33.4 g/dL (ref 32.0–36.0)
MCV: 91.3 fL (ref 80.0–100.0)
MPV: 12.4 fL (ref 7.5–12.5)
Monocytes Relative: 6.2 %
Neutro Abs: 3381 cells/uL (ref 1500–7800)
Neutrophils Relative %: 63.8 %
Platelets: 221 10*3/uL (ref 140–400)
RBC: 3.93 10*6/uL (ref 3.80–5.10)
RDW: 12.6 % (ref 11.0–15.0)
Total Lymphocyte: 29.6 %
WBC: 5.3 10*3/uL (ref 3.8–10.8)

## 2022-03-16 LAB — GASTROINTESTINAL PATHOGEN PNL
CampyloBacter Group: NOT DETECTED
Norovirus GI/GII: NOT DETECTED
Rotavirus A: NOT DETECTED
Salmonella species: NOT DETECTED
Shiga Toxin 1: NOT DETECTED
Shiga Toxin 2: NOT DETECTED
Shigella Species: NOT DETECTED
Vibrio Group: NOT DETECTED
Yersinia enterocolitica: NOT DETECTED

## 2022-03-16 LAB — CRYPTOSPORIDIUM ANTIGEN, EIA
Specimen Quality:: ADEQUATE
micro Number:: 13923210

## 2022-03-16 LAB — C. DIFFICILE GDH AND TOXIN A/B
GDH ANTIGEN: NOT DETECTED
MICRO NUMBER:: 13919568
SPECIMEN QUALITY:: ADEQUATE
TOXIN A AND B: NOT DETECTED

## 2022-03-16 LAB — GIARDIA ANTIGEN
MICRO NUMBER:: 13925131
RESULT:: NOT DETECTED
SPECIMEN QUALITY:: ADEQUATE

## 2022-04-12 NOTE — Progress Notes (Unsigned)
FOLLOW UP  Assessment and Plan:   Cholesterol Currently with moderate elevations, does have maternal MI hx working on lifestyle, has reduced animal fats, increasing fiber intake Strong preference to avoid medication if possible, particularly statin Discussed zetia, CT coronary calcium; she would like information and will consider but hold off for now Continue low cholesterol diet and exercise.  Check lipid panel.  CMP  Abnormal Glucose Recent A1Cs at goal Discussed diet/exercise, weight management  A1C; check CMP  GERD Well managed on current medications Discussed diet, avoiding triggers and other lifestyle changes  Hypothyroidism Hx of recurrent mild elevations resolved;  Recheck TSH She is persistently symptomatic, consider trial of low dose synthroid if remains borderline  Vitamin D Def At goal at last visit; continue supplementation to maintain goal of 60-100 Defer Vit D level  Medication Management Continued  Flu vaccine Need Flu Quad 6+ months PF IM given    Continue diet and meds as discussed. Further disposition pending results of labs. Discussed med's effects and SE's.   Over 30 minutes of exam, counseling, chart review, and critical decision making was performed.   Future Appointments  Date Time Provider [place]  04/13/2022  9:30 AM Alycia Rossetti, NP [facility] None  07/19/2022  9:30 AM Unk Pinto, MD [facility] None  01/21/2023 10:00 AM Unk Pinto, MD [facility] None    ----------------------------------------------------------------------------------------------------------------------  HPI 66 y.o. female  presents for 3 month follow up on cholesterol, elevated TSH, anxiety, GERD, vitamin D deficiency.   She continues to have a dry cough. States she had a URI several weeks ago and is feeling much better but has a persistent dry cough. Denies shortness of breath, wheezing, fever, sore throat, myalgias.  She has hx of anxiety  treated by sertraline 150 mg daily.   GERD taking omprazole 40 mg in AM, famotidine 20 mg at night and reports no longer having breakthrough sx at night.   BMI is There is no height or weight on file to calculate BMI., she has been working on diet and exercise, very active in yard, watching her young grandson, hits 8000-10000 steps on fitbit daily. Admits some plurging over the holidays.  Wt Readings from Last 3 Encounters:  03/10/22 141 lb (64 kg)  01/01/22 144 lb 9.6 oz (65.6 kg)  10/08/21 141 lb 6.4 oz (64.1 kg)   Her blood pressure has been controlled at home, today their BP is   BP Readings from Last 3 Encounters:  03/10/22 116/62  01/01/22 120/72  10/08/21 102/60     She does not workout but generally active. She denies chest pain, shortness of breath, dizziness.   She is on cholesterol medication, Rosuvastatin 10 mg daily.  Her cholesterol is not at goal. She did start fish oil since last visit. The cholesterol last visit was:   Lab Results  Component Value Date   CHOL 150 01/01/2022   HDL 53 01/01/2022   LDLCALC 69 01/01/2022   LDLDIRECT 130.8 01/08/2013   TRIG 201 (H) 01/01/2022   CHOLHDL 2.8 01/01/2022    She has been working on diet and exercise for hx of prediabetes, and denies increased appetite, nausea, paresthesia of the feet, polydipsia, polyuria and visual disturbances.  Last A1C in the office was:  Lab Results  Component Value Date   HGBA1C 5.4 01/01/2022   TSh was mildly elevated at previous check (6.16 11/2019); she endorses mild fatigue, thinning hair; denies weight gain, dry skin, constipation.  Lab Results  Component Value Date  TSH 6.51 (H) 01/01/2022    Patient is on Vitamin D supplement.   Lab Results  Component Value Date   VD25OH 50 01/01/2022     B12 was found to be borderline low, recommended for sublingual supplement Lab Results  Component Value Date   VITAMINB12 567 01/01/2022   She reports hx of intermittent mild iron def, donates  blood/plasma frequently, occasionally can't give due to being too low, taking iron pill twice daily.  Lab Results  Component Value Date   IRON 107 01/01/2022   TIBC 326 01/01/2022   FERRITIN 51 06/25/2020     Current Medications:  Current Outpatient Medications on File Prior to Visit  Medication Sig   Cholecalciferol (VITAMIN D) 125 MCG (5000 UT) CAPS Take 1 capsule by mouth daily.   Cyanocobalamin (B-12) 500 MCG SUBL Place under the tongue daily.   cyclobenzaprine (FLEXERIL) 10 MG tablet Take  1/2 to 1 tablet  3 x /day  as needed for Muscle Spasm   famotidine (PEPCID) 20 MG tablet Take 20 mg by mouth daily. Prior to dinner. Gets OTC   ibuprofen (ADVIL) 200 MG tablet Take 200 mg by mouth every 6 (six) hours as needed.   Multiple Vitamin (MULTIVITAMIN) tablet Take 1 tablet by mouth daily.   Multiple Vitamins-Minerals (HAIR SKIN AND NAILS FORMULA PO) Take by mouth.   omeprazole (PRILOSEC) 40 MG capsule TAKE 1 CAPSULE BY MOUTH DAILY PRIOR TO BREAKFAST TO PREVENT INDIGESTION AND HEARTBURN   Probiotic Product (PROBIOTIC PO) Take by mouth.   rosuvastatin (CRESTOR) 10 MG tablet TAKE 1 TABLET BY MOUTH DAILY FOR CHOLESTEROL   sertraline (ZOLOFT) 100 MG tablet Take 1 & 1/2 tablet (150 mg)  Daily  for Mood                                             /                                   TAKE                       BY                    MOUTH   sucralfate (CARAFATE) 1 g tablet Take 1 tablet (1 g total) by mouth 4 (four) times daily.   vitamin C (ASCORBIC ACID) 250 MG tablet Take 250 mg by mouth daily. 1 gummy daily.   Zinc 50 MG CAPS Take by mouth daily. Couple days a week   No current facility-administered medications on file prior to visit.     Allergies:  Allergies  Allergen Reactions   Amoxil [Amoxicillin] Itching   Penicillins Rash    ? Rash as child     Medical History:  Past Medical History:  Diagnosis Date   Anxiety    Diverticulitis 2016   Fracture of fifth metacarpal bone of  left hand 09/03/2011   post fall   GERD (gastroesophageal reflux disease)    Osteoarthritis    Family history- Reviewed and unchanged Social history- Reviewed and unchanged    Review of Systems:  Review of Systems  Constitutional:  Negative for malaise/fatigue and weight loss.  HENT:  Negative for hearing loss and tinnitus.   Eyes:  Negative for  blurred vision and double vision.  Respiratory:  Positive for cough (nonproductive dry cough). Negative for shortness of breath and wheezing.   Cardiovascular:  Negative for chest pain, palpitations, orthopnea, claudication and leg swelling.  Gastrointestinal:  Negative for abdominal pain, blood in stool, constipation, diarrhea, heartburn, melena, nausea and vomiting.  Genitourinary: Negative.   Musculoskeletal:  Negative for joint pain and myalgias.  Skin:  Negative for rash.  Neurological:  Negative for dizziness, tingling, sensory change, weakness and headaches.  Endo/Heme/Allergies:  Negative for polydipsia.  Psychiatric/Behavioral:  The patient is nervous/anxious.   All other systems reviewed and are negative.   Physical Exam: There were no vitals taken for this visit. Wt Readings from Last 3 Encounters:  03/10/22 141 lb (64 kg)  01/01/22 144 lb 9.6 oz (65.6 kg)  10/08/21 141 lb 6.4 oz (64.1 kg)   General Appearance: Well nourished, in no apparent distress. Eyes: PERRLA, EOMs, conjunctiva no swelling or erythema Sinuses: No Frontal/maxillary tenderness ENT/Mouth: Ext aud canals clear, TMs without erythema, bulging. No erythema, swelling, or exudate on post pharynx.  Tonsils not swollen or erythematous. Hearing normal.  Neck: Supple, thyroid normal.  Respiratory: Respiratory effort normal, BS equal bilaterally without rales, rhonchi, wheezing or stridor.  Cardio: RRR with no MRGs. Brisk peripheral pulses without edema.  Abdomen: Soft, + BS.  Non tender, no guarding, rebound, hernias, masses. Lymphatics: Non tender without  lymphadenopathy.  Musculoskeletal: Full ROM, 5/5 strength, Normal gait Skin: Warm, dry without rashes, lesions, ecchymosis.  Neuro: Cranial nerves intact. No cerebellar symptoms.  Psych: Awake and oriented X 3, normal affect, Insight and Judgment appropriate.    Raynelle Dick, NP 9:03 AM [facility]

## 2022-04-13 ENCOUNTER — Encounter: Payer: Self-pay | Admitting: Nurse Practitioner

## 2022-04-13 ENCOUNTER — Ambulatory Visit: Payer: Commercial Managed Care - HMO | Admitting: Nurse Practitioner

## 2022-04-13 VITALS — BP 128/72 | HR 78 | Temp 97.7°F | Ht 67.0 in | Wt 139.6 lb

## 2022-04-13 DIAGNOSIS — R1032 Left lower quadrant pain: Secondary | ICD-10-CM

## 2022-04-13 DIAGNOSIS — R7309 Other abnormal glucose: Secondary | ICD-10-CM | POA: Diagnosis not present

## 2022-04-13 DIAGNOSIS — Z23 Encounter for immunization: Secondary | ICD-10-CM | POA: Diagnosis not present

## 2022-04-13 DIAGNOSIS — E039 Hypothyroidism, unspecified: Secondary | ICD-10-CM

## 2022-04-13 DIAGNOSIS — E559 Vitamin D deficiency, unspecified: Secondary | ICD-10-CM

## 2022-04-13 DIAGNOSIS — S161XXD Strain of muscle, fascia and tendon at neck level, subsequent encounter: Secondary | ICD-10-CM

## 2022-04-13 DIAGNOSIS — K2100 Gastro-esophageal reflux disease with esophagitis, without bleeding: Secondary | ICD-10-CM

## 2022-04-13 DIAGNOSIS — Z79899 Other long term (current) drug therapy: Secondary | ICD-10-CM

## 2022-04-13 DIAGNOSIS — E782 Mixed hyperlipidemia: Secondary | ICD-10-CM

## 2022-04-13 DIAGNOSIS — E611 Iron deficiency: Secondary | ICD-10-CM

## 2022-04-13 MED ORDER — CYCLOBENZAPRINE HCL 10 MG PO TABS: ORAL_TABLET | ORAL | 0 refills | Status: DC

## 2022-04-13 NOTE — Patient Instructions (Signed)
Diverticulitis  Diverticulitis is when small pouches in your colon (large intestine) get infected or swollen. This causes pain in the belly (abdomen) and watery poop (diarrhea). These pouches are called diverticula. The pouches form in people who have a condition called diverticulosis. What are the causes? This condition may be caused by poop (stool) that gets trapped in the pouches in your colon. The poop lets germs (bacteria) grow in the pouches. This causes the infection. What increases the risk? You are more likely to get this condition if you have small pouches in your colon. The risk is higher if: You are overweight or very overweight (obese). You do not exercise enough. You drink alcohol. You smoke or use products with tobacco in them. You eat a diet that has a lot of red meat such as beef, pork, or lamb. You eat a diet that does not have enough fiber in it. You are older than 64 years of age. What are the signs or symptoms? Pain in the belly. Pain is often on the left side, but it may be in other areas. Fever and feeling cold. Feeling like you may vomit. Vomiting. Having cramps. Feeling full. Changes to how often you poop. Blood in your poop. How is this treated? Most cases are treated at home by: Taking over-the-counter pain medicines. Following a clear liquid diet. Taking antibiotic medicines. Resting. Very bad cases may need to be treated at a hospital. This may include: Not eating or drinking. Taking prescription pain medicine. Getting antibiotic medicines through an IV tube. Getting fluid and food through an IV tube. Having surgery. When you are feeling better, your doctor may tell you to have a test to check your colon (colonoscopy). Follow these instructions at home: Medicines Take over-the-counter and prescription medicines only as told by your doctor. These include: Antibiotics. Pain medicines. Fiber pills. Probiotics. Stool softeners. If you were  prescribed an antibiotic medicine, take it as told by your doctor. Do not stop taking the antibiotic even if you start to feel better. Ask your doctor if the medicine prescribed to you requires you to avoid driving or using machinery. Eating and drinking  Follow a diet as told by your doctor. When you feel better, your doctor may tell you to change your diet. You may need to eat a lot of fiber. Fiber makes it easier to poop (have a bowel movement). Foods with fiber include: Berries. Beans. Lentils. Green vegetables. Avoid eating red meat. General instructions Do not use any products that contain nicotine or tobacco, such as cigarettes, e-cigarettes, and chewing tobacco. If you need help quitting, ask your doctor. Exercise 3 or more times a week. Try to get 30 minutes each time. Exercise enough to sweat and make your heart beat faster. Keep all follow-up visits as told by your doctor. This is important. Contact a doctor if: Your pain does not get better. You are not pooping like normal. Get help right away if: Your pain gets worse. Your symptoms do not get better. Your symptoms get worse very fast. You have a fever. You vomit more than one time. You have poop that is: Bloody. Black. Tarry. Summary This condition happens when small pouches in your colon get infected or swollen. Take medicines only as told by your doctor. Follow a diet as told by your doctor. Keep all follow-up visits as told by your doctor. This is important. This information is not intended to replace advice given to you by your health care provider. Make sure   you discuss any questions you have with your health care provider. Document Revised: 03/26/2019 Document Reviewed: 03/26/2019 Elsevier Patient Education  2023 [employer] Inc[employer]  

## 2022-04-14 ENCOUNTER — Other Ambulatory Visit: Payer: Self-pay | Admitting: Nurse Practitioner

## 2022-04-14 DIAGNOSIS — E039 Hypothyroidism, unspecified: Secondary | ICD-10-CM

## 2022-04-14 LAB — LIPID PANEL
Cholesterol: 140 mg/dL (ref <200)
HDL: 46 mg/dL — ABNORMAL LOW (ref >=50)
LDL Cholesterol (Calc): 71 mg/dL (calc)
Non-HDL Cholesterol (Calc): 94 mg/dL (calc) (ref <130)
Total CHOL/HDL Ratio: 3 (calc) (ref <5.0)
Triglycerides: 156 mg/dL — ABNORMAL HIGH (ref <150)

## 2022-04-14 LAB — CBC WITH DIFFERENTIAL/PLATELET
Absolute Monocytes: 427 cells/uL (ref 200–950)
Basophils Absolute: 0 cells/uL (ref 0–200)
Basophils Relative: 0 %
Eosinophils Absolute: 31 cells/uL (ref 15–500)
Eosinophils Relative: 0.5 %
HCT: 36.3 % (ref 35.0–45.0)
Hemoglobin: 12.3 g/dL (ref 11.7–15.5)
Lymphs Abs: 1141 cells/uL (ref 850–3900)
MCH: 30.5 pg (ref 27.0–33.0)
MCHC: 33.9 g/dL (ref 32.0–36.0)
MCV: 90.1 fL (ref 80.0–100.0)
MPV: 12.4 fL (ref 7.5–12.5)
Monocytes Relative: 7 %
Neutro Abs: 4502 cells/uL (ref 1500–7800)
Neutrophils Relative %: 73.8 %
Platelets: 192 10*3/uL (ref 140–400)
RBC: 4.03 10*6/uL (ref 3.80–5.10)
RDW: 12.3 % (ref 11.0–15.0)
Total Lymphocyte: 18.7 %
WBC: 6.1 10*3/uL (ref 3.8–10.8)

## 2022-04-14 LAB — COMPLETE METABOLIC PANEL WITH GFR
AG Ratio: 1.8 (calc) (ref 1.0–2.5)
ALT: 12 U/L (ref 6–29)
AST: 17 U/L (ref 10–35)
Albumin: 4.3 g/dL (ref 3.6–5.1)
Alkaline phosphatase (APISO): 76 U/L (ref 37–153)
BUN: 10 mg/dL (ref 7–25)
CO2: 28 mmol/L (ref 20–32)
Calcium: 9.3 mg/dL (ref 8.6–10.4)
Chloride: 104 mmol/L (ref 98–110)
Creat: 0.84 mg/dL (ref 0.50–1.05)
Globulin: 2.4 g/dL (calc) (ref 1.9–3.7)
Glucose, Bld: 91 mg/dL (ref 65–99)
Potassium: 4.6 mmol/L (ref 3.5–5.3)
Sodium: 141 mmol/L (ref 135–146)
Total Bilirubin: 0.6 mg/dL (ref 0.2–1.2)
Total Protein: 6.7 g/dL (ref 6.1–8.1)
eGFR: 78 mL/min/{1.73_m2} (ref >=60)

## 2022-04-14 LAB — TSH: TSH: 4.45 mIU/L (ref 0.40–4.50)

## 2022-04-14 MED ORDER — LEVOTHYROXINE SODIUM 25 MCG PO TABS: 25.0000 ug | ORAL_TABLET | Freq: Every day | ORAL | 3 refills | Status: DC

## 2022-06-22 ENCOUNTER — Encounter: Payer: Self-pay | Admitting: Nurse Practitioner

## 2022-06-22 ENCOUNTER — Other Ambulatory Visit: Payer: Self-pay

## 2022-06-22 ENCOUNTER — Ambulatory Visit (INDEPENDENT_AMBULATORY_CARE_PROVIDER_SITE_OTHER): Payer: Commercial Managed Care - HMO | Admitting: Nurse Practitioner

## 2022-06-22 VITALS — BP 136/78 | HR 83 | Temp 97.3°F | Wt 141.0 lb

## 2022-06-22 DIAGNOSIS — J00 Acute nasopharyngitis [common cold]: Secondary | ICD-10-CM | POA: Diagnosis not present

## 2022-06-22 DIAGNOSIS — R051 Acute cough: Secondary | ICD-10-CM | POA: Diagnosis not present

## 2022-06-22 DIAGNOSIS — Z1152 Encounter for screening for COVID-19: Secondary | ICD-10-CM | POA: Diagnosis not present

## 2022-06-22 DIAGNOSIS — R6889 Other general symptoms and signs: Secondary | ICD-10-CM

## 2022-06-22 LAB — POC COVID19 BINAXNOW: SARS Coronavirus 2 Ag: NEGATIVE

## 2022-06-22 LAB — POCT INFLUENZA A/B
Influenza A, POC: NEGATIVE
Influenza B, POC: NEGATIVE

## 2022-06-22 MED ORDER — PROMETHAZINE-DM 6.25-15 MG/5ML PO SYRP: 5.0000 mL | ORAL_SOLUTION | Freq: Four times a day (QID) | ORAL | 0 refills | Status: DC | PRN

## 2022-06-22 MED ORDER — PREDNISONE 10 MG PO TABS: ORAL_TABLET | ORAL | 0 refills | Status: DC

## 2022-06-22 NOTE — Progress Notes (Signed)
Assessment and Plan:  Kathryn [name] was seen today for an episodic visit.  Diagnoses and all order for this visit:  1. Flu-like symptoms Negative  - POCT Influenza A/B  2. Encounter for screening for COVID-19 Negative  - POC COVID-19  3. Acute nasopharyngitis Stay well hydrated to keep mucus thin and productive. Hot steamy shower can help to break up mucus Start Prednisone taper  4.  Acute Cough Start Promethazine cough syrup Stay well hydrated.  Orders Placed This Encounter  Procedures   POC COVID-19    Order Specific Question:   Previously tested for COVID-19    Answer:   Yes    Order Specific Question:   Resident in a congregate (group) care setting    Answer:   No    Order Specific Question:   Employed in healthcare setting    Answer:   No    Order Specific Question:   Pregnant    Answer:   No   POCT Influenza A/B   Meds ordered this encounter  Medications   predniSONE (DELTASONE) 10 MG tablet    Sig: 1 tab 3 x day for 2 days, then 1 tab 2 x day for 2 days, then 1 tab 1 x day for 3 days    Dispense:  13 tablet    Refill:  0    Order Specific Question:   Supervising Provider    Answer:   Lucky Cowboy [phone]   promethazine-dextromethorphan (PROMETHAZINE-DM) 6.25-15 MG/5ML syrup    Sig: Take 5 mLs by mouth 4 (four) times daily as needed for cough.    Dispense:  240 mL    Refill:  0    Order Specific Question:   Supervising Provider    Answer:   Lucky Cowboy [phone]     Continue to monitor for any increase in fever, chills, SOB or worsening symptoms.  Notify office for further evaluation and treatment, questions or concerns if s/s fail to improve. The risks and benefits of my recommendations, as well as other treatment options were discussed with the patient today. Questions were answered.  Further disposition pending results of labs. Discussed med's effects and SE's.    Over 15 minutes of exam, counseling, chart review, and critical decision making  was performed.   Future Appointments  Date Time Provider Department Center  07/19/2022  9:30 AM Lucky Cowboy, MD [facility] None  01/21/2023 10:00 AM Lucky Cowboy, MD [facility] None    ------------------------------------------------------------------------------------------------------------------   HPI BP 136/78   Pulse 83   Temp (!) 97.3 F (36.3 C)   Wt 141 lb (64 kg)   SpO2 97%   BMI 22.08 kg/m    Patient complains of symptoms of a URI, possible sinusitis. Symptoms include achiness, congestion, cough described as productive, facial pain, nasal congestion, sneezing, and sore throat. Onset of symptoms was 5 days ago, and has been unchanged since that time. Treatment to date: decongestants and IBU .  Denies fever, chills, N/V.  Home Covid test has been negative.   Past Medical History:  Diagnosis Date   Anxiety    Diverticulitis 2016   Fracture of fifth metacarpal bone of left hand 09/03/2011   post fall   GERD (gastroesophageal reflux disease)    Osteoarthritis      Allergies  Allergen Reactions   Amoxil [Amoxicillin] Itching   Penicillins Rash    ? Rash as child    Current Outpatient Medications on File Prior to Visit  Medication Sig   Cholecalciferol (  VITAMIN D) 125 MCG (5000 UT) CAPS Take 1 capsule by mouth daily.   Cyanocobalamin (B-12) 500 MCG SUBL Place under the tongue daily.   cyclobenzaprine (FLEXERIL) 10 MG tablet Take  1/2 to 1 tablet  3 x /day  as needed for Muscle Spasm   famotidine (PEPCID) 20 MG tablet Take 20 mg by mouth daily. Prior to dinner. Gets OTC   ibuprofen (ADVIL) 200 MG tablet Take 200 mg by mouth every 6 (six) hours as needed.   levothyroxine (SYNTHROID) 25 MCG tablet Take 1 tablet (25 mcg total) by mouth daily before breakfast.   Multiple Vitamin (MULTIVITAMIN) tablet Take 1 tablet by mouth daily.   Multiple Vitamins-Minerals (HAIR SKIN AND NAILS FORMULA PO) Take by mouth.   omeprazole (PRILOSEC) 40 MG capsule TAKE 1 CAPSULE BY  MOUTH DAILY PRIOR TO BREAKFAST TO PREVENT INDIGESTION AND HEARTBURN   Probiotic Product (PROBIOTIC PO) Take by mouth.   rosuvastatin (CRESTOR) 10 MG tablet TAKE 1 TABLET BY MOUTH DAILY FOR CHOLESTEROL   sertraline (ZOLOFT) 100 MG tablet Take 1 & 1/2 tablet (150 mg)  Daily  for Mood                                             /                                   TAKE                       BY                    MOUTH   sucralfate (CARAFATE) 1 g tablet Take 1 tablet (1 g total) by mouth 4 (four) times daily.   vitamin C (ASCORBIC ACID) 250 MG tablet Take 250 mg by mouth daily. 1 gummy daily.   Zinc 50 MG CAPS Take by mouth daily. Couple days a week   No current facility-administered medications on file prior to visit.    ROS: all negative except what is noted in the HPI.   Physical Exam:  BP 136/78   Pulse 83   Temp (!) 97.3 F (36.3 C)   Wt 141 lb (64 kg)   SpO2 97%   BMI 22.08 kg/m   General Appearance: NAD.  Awake, conversant and cooperative. Eyes: PERRLA, EOMs intact.  Sclera white.  Conjunctiva without erythema. Sinuses: Frontal/maxillary tenderness.  No nasal discharge. Nares patent.  ENT/Mouth: Ext aud canals clear.  Bilateral TMs w/DOL and without erythema or bulging. Hearing intact.  Posterior pharynx without swelling or exudate.  Tonsils without swelling or erythema.  Neck: Supple.  No masses, nodules or thyromegaly. Respiratory: Effort is regular with non-labored breathing. Breath sounds are equal bilaterally without rales, rhonchi, wheezing or stridor.  Cardio: RRR with no MRGs. Brisk peripheral pulses without edema.  Abdomen: Active BS in all four quadrants.  Soft and non-tender without guarding, rebound tenderness, hernias or masses. Lymphatics: Non tender without lymphadenopathy.  Musculoskeletal: Full ROM, 5/5 strength, normal ambulation.  No clubbing or cyanosis. Skin: Appropriate color for ethnicity. Warm without rashes, lesions, ecchymosis, ulcers.  Neuro: CN II-XII  grossly normal. Normal muscle tone without cerebellar symptoms and intact sensation.   Psych: AO X 3,  appropriate mood and affect, insight and  judgment.     Adela Glimpse, NP 4:08 PM [facility]

## 2022-06-22 NOTE — Patient Instructions (Signed)

## 2022-06-23 ENCOUNTER — Other Ambulatory Visit: Payer: Self-pay | Admitting: Nurse Practitioner

## 2022-06-23 ENCOUNTER — Encounter: Payer: Self-pay | Admitting: Nurse Practitioner

## 2022-06-23 DIAGNOSIS — R051 Acute cough: Secondary | ICD-10-CM

## 2022-06-23 MED ORDER — PROMETHAZINE-DM 6.25-15 MG/5ML PO SYRP: 5.0000 mL | ORAL_SOLUTION | Freq: Four times a day (QID) | ORAL | 0 refills | Status: DC | PRN

## 2022-06-23 MED ORDER — AZITHROMYCIN 500 MG PO TABS: 500.0000 mg | ORAL_TABLET | Freq: Every day | ORAL | 0 refills | Status: DC

## 2022-06-23 MED ORDER — AZITHROMYCIN 500 MG PO TABS: 500.0000 mg | ORAL_TABLET | Freq: Every day | ORAL | 0 refills | Status: AC

## 2022-06-28 ENCOUNTER — Encounter: Payer: Self-pay | Admitting: Nurse Practitioner

## 2022-06-29 ENCOUNTER — Other Ambulatory Visit: Payer: Self-pay | Admitting: Internal Medicine

## 2022-06-29 DIAGNOSIS — F419 Anxiety disorder, unspecified: Secondary | ICD-10-CM

## 2022-06-29 DIAGNOSIS — K2100 Gastro-esophageal reflux disease with esophagitis, without bleeding: Secondary | ICD-10-CM

## 2022-06-29 DIAGNOSIS — E039 Hypothyroidism, unspecified: Secondary | ICD-10-CM

## 2022-06-29 DIAGNOSIS — E782 Mixed hyperlipidemia: Secondary | ICD-10-CM

## 2022-06-29 MED ORDER — SERTRALINE HCL 100 MG PO TABS: ORAL_TABLET | ORAL | 3 refills | Status: DC

## 2022-06-29 MED ORDER — LEVOTHYROXINE SODIUM 25 MCG PO TABS: ORAL_TABLET | ORAL | 3 refills | Status: DC

## 2022-06-29 MED ORDER — ROSUVASTATIN CALCIUM 10 MG PO TABS: ORAL_TABLET | ORAL | 3 refills | Status: DC

## 2022-06-29 MED ORDER — OMEPRAZOLE 40 MG PO CPDR: DELAYED_RELEASE_CAPSULE | ORAL | 3 refills | Status: DC

## 2022-06-29 NOTE — Telephone Encounter (Signed)
Please have her keep the appointment.

## 2022-06-29 NOTE — Telephone Encounter (Signed)
Pt called and made an appt for Wednesday. Should she keep it or can an alternative medication be sent in for her since she was juts seen last week

## 2022-06-30 NOTE — Progress Notes (Unsigned)
Assessment and Plan:  Kathryn [name] was seen today for acute visit.  Diagnoses and all orders for this visit:  Hypothyroidism, unspecified type Please take your thyroid medication greater than 30 min before breakfast, separated by at least 4 hours  from antacids, calcium, iron, and multivitamins.  -     TSH  Bronchitis Begin Vit D, Vit C and Zinc Push fluids, Mucinex as needed If no improvement with use of new antibiotic, steroid notify the office If unable to get deep breath or develop a fever please go to the ER If chest xray reveals pneumonia will add an additional antibiotic -     levofloxacin (LEVAQUIN) 750 MG tablet; Take 1 tablet (750 mg total) by mouth daily for 7 days. -     dexamethasone (DECADRON)10 mg IM x1 -     albuterol (VENTOLIN HFA) 108 (90 Base) MCG/ACT inhaler; Inhale 2 puffs into the lungs every 6 (six) hours as needed for wheezing or shortness of breath. -     DG Chest 2 View; Future -     ipratropium-albuterol (DUONEB) 0.5-2.5 (3) MG/3ML nebulizer solution 3 mL       Further disposition pending results of labs. Discussed med's effects and SE's.   Over 30 minutes of exam, counseling, chart review, and critical decision making was performed.   Future Appointments  Date Time Provider [place]  07/19/2022  9:30 AM Unk Pinto, MD [facility] None  01/21/2023 10:00 AM Unk Pinto, MD [facility] None    ------------------------------------------------------------------------------------------------------------------   HPI BP 112/68   Pulse 87   Temp 98.4 F (36.9 C)   Ht 5\' 7"  (1.702 m)   Wt 138 lb (62.6 kg)   SpO2 97%   BMI 21.61 kg/m    67 y.o.female presents for continued congestion, fatigue, cough and laryngitis.  She completed a course of Prednisone and Zithromax and symptoms persist. She is not on any current medication.    She is currently on levothyroxine 25 mcg QD. Complaining of a lot of fatigue. Last TSH was: Lab Results   Component Value Date   TSH 4.45 04/13/2022     BMI is Body mass index is 21.61 kg/m., she has been working on diet and exercise. Wt Readings from Last 3 Encounters:  07/01/22 138 lb (62.6 kg)  06/22/22 141 lb (64 kg)  04/13/22 139 lb 9.6 oz (63.3 kg)    Bp is currently well controlled without medication. Denies chest pain , headaches and dizziness. BP Readings from Last 3 Encounters:  07/01/22 112/68  06/22/22 136/78  04/13/22 128/72     Past Medical History:  Diagnosis Date   Anxiety    Diverticulitis 2016   Fracture of fifth metacarpal bone of left hand 09/03/2011   post fall   GERD (gastroesophageal reflux disease)    Osteoarthritis      Allergies  Allergen Reactions   Amoxil [Amoxicillin] Itching   Penicillins Rash    ? Rash as child    Current Outpatient Medications on File Prior to Visit  Medication Sig   Acetaminophen (TYLENOL PO) Take by mouth.   azithromycin (ZITHROMAX) 500 MG tablet Take 1 tablet (500 mg total) by mouth daily for 10 days.   famotidine (PEPCID) 20 MG tablet Take 20 mg by mouth daily. Prior to dinner. Gets OTC   fluticasone (FLONASE) 50 MCG/ACT nasal spray SHAKE LQ AND U 1 SPR IEN D   ibuprofen (ADVIL) 200 MG tablet Take 200 mg by mouth every 6 (six) hours as needed.  levothyroxine (SYNTHROID) 25 MCG tablet Take  1 tablet  Daily  on an empty stomach with only water for 30 minutes & no Antacid meds, Calcium or Magnesium for 4 hours & avoid Biotin   omeprazole (PRILOSEC) 40 MG capsule Take 1 capsule Daily t0 Prevent Heartburn & Indigestion                                                                 /                                              TAKE                                           BY                                    MOUTH   Probiotic Product (PROBIOTIC PO) Take by mouth.   promethazine-dextromethorphan (PROMETHAZINE-DM) 6.25-15 MG/5ML syrup Take 5 mLs by mouth 4 (four) times daily as needed for cough.   rosuvastatin (CRESTOR) 10  MG tablet Take  1 tablet  Daily for Cholesterol                                                                    /                                                TAKE                                                         BY                                            MOUTH   sertraline (ZOLOFT) 100 MG tablet Take 1 & 1/2 tablet (150 mg)  Daily  for Mood                                             /  TAKE                       BY                    MOUTH   sucralfate (CARAFATE) 1 g tablet Take 1 tablet (1 g total) by mouth 4 (four) times daily.   Cholecalciferol (VITAMIN D) 125 MCG (5000 UT) CAPS Take 1 capsule by mouth daily. (Patient not taking: Reported on 07/01/2022)   Cyanocobalamin (B-12) 500 MCG SUBL Place under the tongue daily. (Patient not taking: Reported on 07/01/2022)   cyclobenzaprine (FLEXERIL) 10 MG tablet Take  1/2 to 1 tablet  3 x /day  as needed for Muscle Spasm (Patient not taking: Reported on 07/01/2022)   Multiple Vitamin (MULTIVITAMIN) tablet Take 1 tablet by mouth daily. (Patient not taking: Reported on 07/01/2022)   Multiple Vitamins-Minerals (HAIR SKIN AND NAILS FORMULA PO) Take by mouth. (Patient not taking: Reported on 07/01/2022)   predniSONE (DELTASONE) 10 MG tablet 1 tab 3 x day for 2 days, then 1 tab 2 x day for 2 days, then 1 tab 1 x day for 3 days (Patient not taking: Reported on 07/01/2022)   vitamin C (ASCORBIC ACID) 250 MG tablet Take 250 mg by mouth daily. 1 gummy daily. (Patient not taking: Reported on 07/01/2022)   Zinc 50 MG CAPS Take by mouth daily. Couple days a week (Patient not taking: Reported on 07/01/2022)   No current facility-administered medications on file prior to visit.    ROS: all negative except above.   Physical Exam:  BP 112/68   Pulse 87   Temp 98.4 F (36.9 C)   Ht 5\' 7"  (1.702 m)   Wt 138 lb (62.6 kg)   SpO2 97%   BMI 21.61 kg/m   General Appearance: Well nourished, in no apparent distress. Eyes: PERRLA,  EOMs, conjunctiva no swelling or erythema Sinuses: No Frontal/maxillary tenderness ENT/Mouth: Ext aud canals clear, TMs without erythema, bulging. No erythema, swelling, or exudate on post pharynx.  Tonsils not swollen or erythematous. Hearing normal.  Neck: Supple, thyroid normal.  Respiratory: Respiratory effort slightly labored, laryngitis noted, Expiratory wheezes upper lobes and crackles right lower lobe. Nebulizer treatment with duo neb in office today- some improvement noted of crackles Cardio: RRR with no MRGs. Brisk peripheral pulses without edema.  Abdomen: Soft, + BS.  Non tender, no guarding, rebound, hernias, masses. Lymphatics: Non tender without lymphadenopathy.  Musculoskeletal: Full ROM, 5/5 strength, normal gait.  Skin: Warm, dry without rashes, lesions, ecchymosis.  Neuro: Cranial nerves intact. Normal muscle tone, no cerebellar symptoms. Sensation intact.  Psych: Awake and oriented X 3, normal affect, Insight and Judgment appropriate.     , NP 11:08 AM    [name]

## 2022-07-01 ENCOUNTER — Ambulatory Visit (INDEPENDENT_AMBULATORY_CARE_PROVIDER_SITE_OTHER): Payer: Commercial Managed Care - HMO | Admitting: Nurse Practitioner

## 2022-07-01 ENCOUNTER — Ambulatory Visit
Admission: RE | Admit: 2022-07-01 | Discharge: 2022-07-01 | Disposition: A | Discharge status: Home / Self Care | Payer: Self-pay | Source: Ambulatory Visit | Attending: Nurse Practitioner | Admitting: Nurse Practitioner

## 2022-07-01 ENCOUNTER — Encounter: Payer: Self-pay | Admitting: Nurse Practitioner

## 2022-07-01 VITALS — BP 112/68 | HR 87 | Temp 98.4°F | Ht 67.0 in | Wt 138.0 lb

## 2022-07-01 DIAGNOSIS — J40 Bronchitis, not specified as acute or chronic: Secondary | ICD-10-CM

## 2022-07-01 DIAGNOSIS — E039 Hypothyroidism, unspecified: Secondary | ICD-10-CM

## 2022-07-01 MED ORDER — DEXAMETHASONE SODIUM PHOSPHATE 10 MG/ML IJ SOLN
10.0000 mg | Freq: Once | INTRAMUSCULAR | Status: AC
  Administered 2022-07-01: 10 mg via INTRAMUSCULAR

## 2022-07-01 MED ORDER — IPRATROPIUM-ALBUTEROL 0.5-2.5 (3) MG/3ML IN SOLN
3.0000 mL | Freq: Once | RESPIRATORY_TRACT | Status: AC
  Administered 2022-07-01: 3 mL via RESPIRATORY_TRACT

## 2022-07-01 MED ORDER — DEXAMETHASONE 4 MG PO TABS: ORAL_TABLET | ORAL | 0 refills | Status: DC

## 2022-07-01 MED ORDER — ALBUTEROL SULFATE HFA 108 (90 BASE) MCG/ACT IN AERS: 2.0000 | INHALATION_SPRAY | Freq: Four times a day (QID) | RESPIRATORY_TRACT | 2 refills | Status: DC | PRN

## 2022-07-01 MED ORDER — LEVOFLOXACIN 750 MG PO TABS: 750.0000 mg | ORAL_TABLET | Freq: Every day | ORAL | 0 refills | Status: AC

## 2022-07-01 NOTE — Patient Instructions (Addendum)
Chest xray ordered at [facility] [place] In M-F 8:30-3:45  Begin Levaquin once a day with biggest meal x 7 days  Albuterol inhaler 2 puffs every 6 hours for wheezing and shortness of breath  Acute Bronchitis, Adult  Acute bronchitis is sudden inflammation of the main airways (bronchi) that come off the windpipe (trachea) in the lungs. The swelling causes the airways to get smaller and make more mucus than normal. This can make it hard to breathe and can cause coughing or noisy breathing (wheezing). Acute bronchitis may last several weeks. The cough may last longer. Allergies, asthma, and exposure to smoke may make the condition worse. What are the causes? This condition can be caused by germs and by substances that irritate the lungs, including: Cold and flu viruses. The most common cause of this condition is the virus that causes the common cold. Bacteria. This is less common. Breathing in substances that irritate the lungs, including: Smoke from cigarettes and other forms of tobacco. Dust and pollen. Fumes from household cleaning products, gases, or burned fuel. Indoor or outdoor air pollution. What increases the risk? The following factors may make you more likely to develop this condition: A weak body's defense system, also called the immune system. A condition that affects your lungs and breathing, such as asthma. What are the signs or symptoms? Common symptoms of this condition include: Coughing. This may bring up clear, yellow, or green mucus from your lungs (sputum). Wheezing. Runny or stuffy nose. Having too much mucus in your lungs (chest congestion). Shortness of breath. Aches and pains, including sore throat or chest. How is this diagnosed? This condition is usually diagnosed based on: Your symptoms and medical history. A physical exam. You may also have other tests, including tests to rule out other conditions, such as pneumonia. These  tests include: A test of lung function. Test of a mucus sample to look for the presence of bacteria. Tests to check the oxygen level in your blood. Blood tests. Chest X-ray. How is this treated? Most cases of acute bronchitis clear up over time without treatment. Your health care provider may recommend: Drinking more fluids to help thin your mucus so it is easier to cough up. Taking inhaled medicine (inhaler) to improve air flow in and out of your lungs. Using a vaporizer or a humidifier. These are machines that add water to the air to help you breathe better. Taking a medicine that thins mucus and clears congestion (expectorant). Taking a medicine that prevents or stops coughing (cough suppressant). It is not common to take an antibiotic medicine for this condition. Follow these instructions at home:  Take over-the-counter and prescription medicines only as told by your health care provider. Use an inhaler, vaporizer, or humidifier as told by your health care provider. Take two teaspoons (10 mL) of honey at bedtime to lessen coughing at night. Drink enough fluid to keep your urine pale yellow. Do not use any products that contain nicotine or tobacco. These products include cigarettes, chewing tobacco, and vaping devices, such as e-cigarettes. If you need help quitting, ask your health care provider. Get plenty of rest. Return to your normal activities as told by your health care provider. Ask your health care provider what activities are safe for you. Keep all follow-up visits. This is important. How is this prevented? To lower your risk of getting this condition again: Wash your hands often with soap and water for at least 20 seconds. If soap and  water are not available, use hand sanitizer. Avoid contact with people who have cold symptoms. Try not to touch your mouth, nose, or eyes with your hands. Avoid breathing in smoke or chemical fumes. Breathing smoke or chemical fumes will make  your condition worse. Get the flu shot every year. Contact a health care provider if: Your symptoms do not improve after 2 weeks. You have trouble coughing up the mucus. Your cough keeps you awake at night. You have a fever. Get help right away if you: Cough up blood. Feel pain in your chest. Have severe shortness of breath. Faint or keep feeling like you are going to faint. Have a severe headache. Have a fever or chills that get worse. These symptoms may represent a serious problem that is an emergency. Do not wait to see if the symptoms will go away. Get medical help right away. Call your local emergency services (911 in the [place] Do not drive yourself to the hospital. Summary Acute bronchitis is inflammation of the main airways (bronchi) that come off the windpipe (trachea) in the lungs. The swelling causes the airways to get smaller and make more mucus than normal. Drinking more fluids can help thin your mucus so it is easier to cough up. Take over-the-counter and prescription medicines only as told by your health care provider. Do not use any products that contain nicotine or tobacco. These products include cigarettes, chewing tobacco, and vaping devices, such as e-cigarettes. If you need help quitting, ask your health care provider. Contact a health care provider if your symptoms do not improve after 2 weeks. This information is not intended to replace advice given to you by your health care provider. Make sure you discuss any questions you have with your health care provider. Document Revised: 09/24/2021 Document Reviewed: 10/15/2020 Elsevier Patient Education  [employer].

## 2022-07-02 LAB — TSH: TSH: 2.82 mIU/L (ref 0.40–4.50)

## 2022-07-18 ENCOUNTER — Encounter: Payer: Self-pay | Admitting: Internal Medicine

## 2022-07-18 NOTE — Progress Notes (Signed)
Future Appointments  Date Time Provider Department  07/19/2022  9:30 AM Unk Pinto, MD [facility]  01/21/2023 10:00 AM Unk Pinto, MD [facility]    History of Present Illness:       This very nice 67 y.o. MWF presents for 6 month follow up with HTN, HLD, Pre-Diabetes and Vitamin D Deficiency.         Patient is monitored expectantly for elevated BP   & BP has been controlled at home. Today's BP is at goal -  118/70. Patient has had no complaints of any cardiac type chest pain, palpitations, dyspnea / orthopnea / PND, dizziness, claudication, or dependent edema.        Hyperlipidemia is controlled with diet & Rosuvastatin.   Patient denies myalgias or other med SE's. Last Lipids were at goal :  Lab Results  Component Value Date   CHOL 140 04/13/2022   HDL 46 (L) 04/13/2022   LDLCALC 71 04/13/2022   TRIG 156 (H) 04/13/2022   CHOLHDL 3.0 04/13/2022      Also, the patient has history of PreDiabetes  (A1c 5.8% /2019) and has had no symptoms of reactive hypoglycemia, diabetic polys, paresthesias or visual blurring.  Last A1c was Normal & at goal :  Lab Results  Component Value Date   HGBA1C 5.4 01/01/2022        Patient had a low level of Vitamin B12 at "396" on June 2021.                                                     Further, the patient also has history of Vitamin D Deficiency and supplements vitamin D . Last vitamin D was  Lab Results  Component Value Date   VD25OH 36 01/01/2022     Current Outpatient Medications on File Prior to Visit  Medication Sig   Acetaminophen  Take as needed   albuterol HFA  inhaler Inhale 2 puffs every 6  hours as needed    famotidine (PEPCID) 20 MG tablet Take 20 mg daily   FLONASE  nasal spray SHAKE LQ AND U 1 SPR IEN D   ibuprofen 200 MG tablet Take 200 mg every 6 (hours as needed.   levothyroxine 25 MCG tablet Take  1 tablet  Daily    omeprazole 40 MG capsule Take 1 capsule Daily                                                Probiotic PROBIOTIC PO) Take by mouth.   promethazine-DM) 6.25-15 MG/5ML  Take 5 mLs  4  times daily as needed    rosuvastatin  10 MG tablet Take  1 tablet  Daily    sertraline  100 MG tablet Take 1 & 1/2 tablet (150 mg)  Daily    sucralfate  1 g tablet Take 1 tablet  4 (four) times daily.     Allergies  Allergen Reactions   Amoxil [Amoxicillin] Itching   Penicillins Rash    ? Rash as child     PMHx:   Past Medical History:  Diagnosis Date   Anxiety    Diverticulitis 2016   Fracture of fifth metacarpal bone of left  hand 09/03/2011   post fall   GERD (gastroesophageal reflux disease)    Osteoarthritis      Immunization History  Administered Date(s) Administered   Influenza,inj,Quad PF,6+ Mos 04/04/2017, 02/22/2019, 06/18/2021, 04/13/2022   Influenza-Unspecified 04/06/2013, 04/28/2018   PPD Test 12/03/2019, 01/01/2022   Td 10/03/2006   Tdap 04/04/2017     Past Surgical History:  Procedure Laterality Date   APPENDECTOMY     COLONOSCOPY     negative; Dr Sharlett Iles   ORIF [place]  09/03/2011   Procedure: OPEN REDUCTION INTERNAL FIXATION (ORIF) METACARPAL (FINGER) FRACTURE;  Surgeon: Johnny Bridge, MD;  Location: [facility];  Service: Orthopedics;  Laterality: Left;   TUBAL LIGATION       FHx:    Reviewed / unchanged   SHx:    Reviewed / unchanged    Systems Review:  Constitutional: Denies fever, chills, wt changes, headaches, insomnia, fatigue, night sweats, change in appetite. Eyes: Denies redness, blurred vision, diplopia, discharge, itchy, watery eyes.  ENT: Denies discharge, congestion, post nasal drip, epistaxis, sore throat, earache, hearing loss, dental pain, tinnitus, vertigo, sinus pain, snoring.  CV: Denies chest pain, palpitations, irregular heartbeat, syncope, dyspnea, diaphoresis, orthopnea, PND, claudication or edema. Respiratory: denies cough, dyspnea, DOE, pleurisy, hoarseness, laryngitis, wheezing.   Gastrointestinal: Denies dysphagia, odynophagia, heartburn, reflux, water brash, abdominal pain or cramps, nausea, vomiting, bloating, diarrhea, constipation, hematemesis, melena, hematochezia  or hemorrhoids. Genitourinary: Denies dysuria, frequency, urgency, nocturia, hesitancy, discharge, hematuria or flank pain. Musculoskeletal: Denies arthralgias, myalgias, stiffness, jt. swelling, pain, limping or strain/sprain.  Skin: Denies pruritus, rash, hives, warts, acne, eczema or change in skin lesion(s). Neuro: No weakness, tremor, incoordination, spasms, paresthesia or pain. Psychiatric: Denies confusion, memory loss or sensory loss. Endo: Denies change in weight, skin or hair change.  Heme/Lymph: No excessive bleeding, bruising or enlarged lymph nodes.   Physical Exam  BP 118/70   Pulse 87   Temp 97.9 F (36.6 C)   Resp 17   Ht 5\' 7"  (1.702 m)   Wt 140 lb (63.5 kg)   SpO2 98%   BMI 21.93 kg/m   Appears  well nourished, well groomed  and in no distress.  Eyes: PERRLA, EOMs, conjunctiva no swelling or erythema. Sinuses: No frontal/maxillary tenderness ENT/Mouth: EAC's clear, TM's nl w/o erythema, bulging. Nares clear w/o erythema, swelling, exudates. Oropharynx clear without erythema or exudates. Oral hygiene is good. Tongue normal, non obstructing. Hearing intact.  Neck: Supple. Thyroid not palpable. Car 2+/2+ without bruits, nodes or JVD. Chest: Respirations nl with BS clear & equal w/o rales, rhonchi, wheezing or stridor.  Cor: Heart sounds normal w/ regular rate and rhythm without sig. murmurs, gallops, clicks or rubs. Peripheral pulses normal and equal  without edema.  Abdomen: Soft & bowel sounds normal. Non-tender w/o guarding, rebound, hernias, masses or organomegaly.  Lymphatics: Unremarkable.  Musculoskeletal: Full ROM all peripheral extremities, joint stability, 5/5 strength and normal gait.  Skin: Warm, dry without exposed rashes, lesions or ecchymosis apparent.  Neuro:  Cranial nerves intact, reflexes equal bilaterally. Sensory-motor testing grossly intact. Tendon reflexes grossly intact.  Pysch: Alert & oriented x 3.  Insight and judgement nl & appropriate. No ideations.   Assessment and Plan:  1. Elevated BP without diagnosis of hypertension  - Continue medication, monitor blood pressure at home.  - Continue DASH diet.  Reminder to go to the ER if any CP,  SOB, nausea, dizziness, severe HA, changes vision/speech.   - CBC with Differential/Platelet - COMPLETE METABOLIC  PANEL WITH GFR - Magnesium - TSH  2. Hyperlipidemia, mixed  - Continue diet/meds, exercise,& lifestyle modifications.  - Continue monitor periodic cholesterol/liver & renal functions    - Lipid panel - TSH  3. Abnormal glucose  - Continue diet, exercise  - Lifestyle modifications.  - Monitor appropriate labs   - Hemoglobin A1c - Insulin, random  4. Vitamin D deficiency  - Continue supplementation   - VITAMIN D 25 Hydroxy   5. B12 deficiency  - CBC with Differential/Platelet  6. Medication management  - CBC with Differential/Platelet - COMPLETE METABOLIC PANEL WITH GFR - Magnesium - Lipid panel - TSH - Hemoglobin A1c - Insulin, random - VITAMIN D 25 Hydroxy         Discussed  regular exercise, BP monitoring, weight control to achieve/maintain BMI less than 25 and discussed med and SE's. Recommended labs to assess /monitor clinical status .  I discussed the assessment and treatment plan with the patient. The patient was provided an opportunity to ask questions and all were answered. The patient agreed with the plan and demonstrated an understanding of the instructions.  I provided over 30 minutes of exam, counseling, chart review and  complex critical decision making.        The patient was advised to call back or seek an in-person evaluation if the symptoms worsen or if the condition fails to improve as anticipated.   Marinus Maw, MD

## 2022-07-18 NOTE — Patient Instructions (Signed)

## 2022-07-19 ENCOUNTER — Encounter: Payer: Self-pay | Admitting: Internal Medicine

## 2022-07-19 ENCOUNTER — Ambulatory Visit (INDEPENDENT_AMBULATORY_CARE_PROVIDER_SITE_OTHER): Payer: Commercial Managed Care - HMO | Admitting: Internal Medicine

## 2022-07-19 VITALS — BP 118/70 | HR 87 | Temp 97.9°F | Resp 17 | Ht 67.0 in | Wt 140.0 lb

## 2022-07-19 DIAGNOSIS — E782 Mixed hyperlipidemia: Secondary | ICD-10-CM

## 2022-07-19 DIAGNOSIS — R7303 Prediabetes: Secondary | ICD-10-CM

## 2022-07-19 DIAGNOSIS — R030 Elevated blood-pressure reading, without diagnosis of hypertension: Secondary | ICD-10-CM | POA: Diagnosis not present

## 2022-07-19 DIAGNOSIS — R7309 Other abnormal glucose: Secondary | ICD-10-CM

## 2022-07-19 DIAGNOSIS — E538 Deficiency of other specified B group vitamins: Secondary | ICD-10-CM

## 2022-07-19 DIAGNOSIS — E559 Vitamin D deficiency, unspecified: Secondary | ICD-10-CM

## 2022-07-19 DIAGNOSIS — Z79899 Other long term (current) drug therapy: Secondary | ICD-10-CM | POA: Diagnosis not present

## 2022-07-20 ENCOUNTER — Other Ambulatory Visit: Payer: Self-pay | Admitting: Internal Medicine

## 2022-07-20 LAB — TSH: TSH: 2.59 mIU/L (ref 0.40–4.50)

## 2022-07-20 LAB — CBC WITH DIFFERENTIAL/PLATELET
Absolute Monocytes: 322 cells/uL (ref 200–950)
Basophils Absolute: 9 cells/uL (ref 0–200)
Basophils Relative: 0.2 %
Eosinophils Absolute: 51 cells/uL (ref 15–500)
Eosinophils Relative: 1.1 %
HCT: 34.3 % — ABNORMAL LOW (ref 35.0–45.0)
Hemoglobin: 11.6 g/dL — ABNORMAL LOW (ref 11.7–15.5)
Lymphs Abs: 1311 cells/uL (ref 850–3900)
MCH: 30.4 pg (ref 27.0–33.0)
MCHC: 33.8 g/dL (ref 32.0–36.0)
MCV: 89.8 fL (ref 80.0–100.0)
MPV: 11.5 fL (ref 7.5–12.5)
Monocytes Relative: 7 %
Neutro Abs: 2907 cells/uL (ref 1500–7800)
Neutrophils Relative %: 63.2 %
Platelets: 223 10*3/uL (ref 140–400)
RBC: 3.82 10*6/uL (ref 3.80–5.10)
RDW: 13.1 % (ref 11.0–15.0)
Total Lymphocyte: 28.5 %
WBC: 4.6 10*3/uL (ref 3.8–10.8)

## 2022-07-20 LAB — HEMOGLOBIN A1C
Hgb A1c MFr Bld: 5.8 % of total Hgb — ABNORMAL HIGH (ref <5.7)
Mean Plasma Glucose: 120 mg/dL
eAG (mmol/L): 6.6 mmol/L

## 2022-07-20 LAB — LIPID PANEL
Cholesterol: 171 mg/dL (ref <200)
HDL: 58 mg/dL (ref >=50)
LDL Cholesterol (Calc): 86 mg/dL (calc)
Non-HDL Cholesterol (Calc): 113 mg/dL (calc) (ref <130)
Total CHOL/HDL Ratio: 2.9 (calc) (ref <5.0)
Triglycerides: 168 mg/dL — ABNORMAL HIGH (ref <150)

## 2022-07-20 LAB — COMPLETE METABOLIC PANEL WITH GFR
AG Ratio: 1.7 (calc) (ref 1.0–2.5)
ALT: 9 U/L (ref 6–29)
AST: 18 U/L (ref 10–35)
Albumin: 4.3 g/dL (ref 3.6–5.1)
Alkaline phosphatase (APISO): 68 U/L (ref 37–153)
BUN: 12 mg/dL (ref 7–25)
CO2: 31 mmol/L (ref 20–32)
Calcium: 9.5 mg/dL (ref 8.6–10.4)
Chloride: 105 mmol/L (ref 98–110)
Creat: 0.84 mg/dL (ref 0.50–1.05)
Globulin: 2.5 g/dL (calc) (ref 1.9–3.7)
Glucose, Bld: 81 mg/dL (ref 65–99)
Potassium: 5 mmol/L (ref 3.5–5.3)
Sodium: 142 mmol/L (ref 135–146)
Total Bilirubin: 0.5 mg/dL (ref 0.2–1.2)
Total Protein: 6.8 g/dL (ref 6.1–8.1)
eGFR: 78 mL/min/{1.73_m2} (ref >=60)

## 2022-07-20 LAB — VITAMIN D 25 HYDROXY (VIT D DEFICIENCY, FRACTURES): Vit D, 25-Hydroxy: 56 ng/mL (ref 30–100)

## 2022-07-20 LAB — MAGNESIUM: Magnesium: 2.5 mg/dL (ref 1.5–2.5)

## 2022-07-20 LAB — INSULIN, RANDOM: Insulin: 5.6 u[IU]/mL

## 2022-07-20 NOTE — Progress Notes (Signed)
<><><><><><><><><><><><><><><><><><><><><><><><><><><><><><><><><> <><><><><><><><><><><><><><><><><><><><><><><><><><><><><><><><><> -   Test results slightly outside the reference range are not unusual. If there is anything important, I will review this with you,  otherwise it is considered normal test values.  If you have further questions,  please do not hesitate to contact me at the office or via My Chart.  <><><><><><><><><><><><><><><><><><><><><><><><><><><><><><><><><> <><><><><><><><><><><><><><><><><><><><><><><><><><><><><><><><><>  -  Total Chol = 171    &   LDL Chol = 86   - Both    Excellent   - Very low risk for Heart Attack  / Stroke <><><><><><><><><><><><><><><><><><><><><><><><><><><><><><><><><>  - A1c = 5.8%  Blood sugar and A1c are elevated in the borderline and                                                               early or pre-diabetes range which has the same   300% increased risk for heart attack, stroke, cancer and                                                 alzheimer- type vascular dementia as full blown diabetes.   But the good news is that diet, exercise with weight loss can                                                                                    cure the early diabetes at this point.  - So . . . Marland Kitchen  - Avoid Sweets, Candy & White Stuff   - White Rice, [place], [place] Flour  - Breads &  Pasta <><><><><><><><><><><><><><><><><><><><><><><><><><><><><><><><><>  - Vitamin D  = 56  - OK  <><><><><><><><><><><><><><><><><><><><><><><><><><><><><><><><><>  - All Else - CBC - Kidneys - Electrolytes - Liver - Magnesium & Thyroid    - all  Normal / OK <><><><><><><><><><><><><><><><><><><><><><><><><><><><><><><><><>

## 2022-08-04 NOTE — Progress Notes (Signed)
THIS ENCOUNTER IS A VIRTUAL VISIT DUE TO COVID-19 - PATIENT WAS NOT SEEN IN THE OFFICE.  PATIENT HAS CONSENTED TO VIRTUAL VISIT / TELEMEDICINE VISIT   Virtual Visit via telephone Note  I connected with  Kathryn [name] on 08/06/2022 by telephone.  I verified that I am speaking with the correct person using two identifiers.    I discussed the limitations of evaluation and management by telemedicine and the availability of in person appointments. The patient expressed understanding and agreed to proceed.  History of Present Illness:  BP 123/71   Pulse 76   Temp 98.8 F (37.1 C)  67 y.o. patient contacted office reporting URI sx . she tested positive by home covid test. OV was conducted by telephone to minimize exposure. This patient was vaccinated for covid 19, last 2021 , first 2 vaccines    Sx began 3 days ago with Fever/chills, productive cough of green mucus, congestion, sore throat, fatigue, body aches and loss of taste and smell   Treatments tried so far: Promethazine DM, advil and Tylenol  Exposures: none   Medications  Current Outpatient Medications (Endocrine & Metabolic):    levothyroxine (SYNTHROID) 25 MCG tablet, Take  1 tablet  Daily  on an empty stomach with only water for 30 minutes & no Antacid meds, Calcium or Magnesium for 4 hours & avoid Biotin  Current Outpatient Medications (Cardiovascular):    rosuvastatin (CRESTOR) 10 MG tablet, Take  1 tablet  Daily for Cholesterol                                                                    /                                                TAKE                                                         BY                                            MOUTH  Current Outpatient Medications (Respiratory):    albuterol (VENTOLIN HFA) 108 (90 Base) MCG/ACT inhaler, Inhale 2 puffs into the lungs every 6 (six) hours as needed for wheezing or shortness of breath.   promethazine-dextromethorphan (PROMETHAZINE-DM) 6.25-15 MG/5ML syrup,  Take 5 mLs by mouth 4 (four) times daily as needed for cough.   fluticasone (FLONASE) 50 MCG/ACT nasal spray, SHAKE LQ AND U 1 SPR IEN D (Patient not taking: Reported on 08/06/2022)  Current Outpatient Medications (Analgesics):    Acetaminophen (TYLENOL PO), Take by mouth.   ibuprofen (ADVIL) 200 MG tablet, Take 200 mg by mouth every 6 (six) hours as needed.   Current Outpatient Medications (Other):    famotidine (PEPCID) 20 MG tablet, Take 20 mg by mouth daily.  Prior to dinner. Gets OTC   omeprazole (PRILOSEC) 40 MG capsule, Take 1 capsule Daily t0 Prevent Heartburn & Indigestion                                                                 /                                              TAKE                                           BY                                    MOUTH   Probiotic Product (PROBIOTIC PO), Take by mouth.   sertraline (ZOLOFT) 100 MG tablet, Take 1 & 1/2 tablet (150 mg)  Daily  for Mood                                             /                                   TAKE                       BY                    MOUTH   sucralfate (CARAFATE) 1 g tablet, Take 1 tablet (1 g total) by mouth 4 (four) times daily.   Zinc 50 MG CAPS, Take by mouth daily. Couple days a week  Allergies:  Allergies  Allergen Reactions   Amoxil [Amoxicillin] Itching   Penicillins Rash    ? Rash as child    Problem list She has Chronic anxiety; GERD; DEGENERATIVE JOINT DISEASE; Chronic venous insufficiency; Subacromial bursitis; Hyperlipidemia, mixed; Abnormal glucose; B12 deficiency; Vitamin D deficiency; and Iron deficiency on their problem list.   Social History:   reports that she has never smoked. She has never used smokeless tobacco. She reports that she does not drink alcohol and does not use drugs.  Observations/Objective:  General : Well sounding patient in no apparent distress HEENT: no hoarseness, no cough for duration of visit Lungs: speaks in complete sentences, no audible  wheezing, no apparent distress Neurological: alert, oriented x 3 Psychiatric: pleasant, judgement appropriate   Assessment and Plan:  Covid 19 Covid 19 positive per rapid screening test with home covid test Risk factors include: has Chronic anxiety; GERD; DEGENERATIVE JOINT DISEASE; Chronic venous insufficiency; Subacromial bursitis; Hyperlipidemia, mixed; Abnormal glucose; B12 deficiency; Vitamin D deficiency; and Iron deficiency on their problem list.  Symptoms are: moderate Due to co morbid conditions and risk factors, discussed antivirals , molnupiravir Immue support reviewed , VitD, Vit C and zinc  Take tylenol PRN temp 101+ Push hydration Regular ambulation or calf exercises exercises for clot prevention and 81 mg ASA unless contraindicated Sx supportive therapy suggested Follow up via mychart or telephone if needed Advised patient obtain O2 monitor; present to ED if persistently <90% or with severe dyspnea, CP, fever uncontrolled by tylenol, confusion, sudden decline  Should remain in isolation 5 days from testing positive and then wear a mask when around other people for the following 5 days  Aluel was seen today for covid exposure.  Diagnoses and all orders for this visit:  COVID -     molnupiravir EUA (LAGEVRIO) 200 mg CAPS capsule; Take 4 capsules (800 mg total) by mouth 2 (two) times daily for 5 days. -     dexamethasone (DECADRON) 1 MG tablet; Take 3 tabs for 3 days, 2 tabs for 3 days 1 tab for 5 days. Take with food. -     azithromycin (ZITHROMAX) 250 MG tablet; Take 2 tablets (500 mg) on  Day 1,  followed by 1 tablet (250 mg) once daily on Days 2 through 5. -     promethazine-dextromethorphan (PROMETHAZINE-DM) 6.25-15 MG/5ML syrup; Take 5 mLs by mouth 4 (four) times daily as needed for cough.  Acute cough -     promethazine-dextromethorphan (PROMETHAZINE-DM) 6.25-15 MG/5ML syrup; Take 5 mLs by mouth 4 (four) times daily as needed for cough.     Follow Up  Instructions:  I discussed the assessment and treatment plan with the patient. The patient was provided an opportunity to ask questions and all were answered. The patient agreed with the plan and demonstrated an understanding of the instructions.   The patient was advised to call back or seek an in-person evaluation if the symptoms worsen or if the condition fails to improve as anticipated.  I provided 20 minutes of non-face-to-face time during this encounter.   Alycia Rossetti, NP

## 2022-08-06 ENCOUNTER — Encounter: Payer: Self-pay | Admitting: Nurse Practitioner

## 2022-08-06 ENCOUNTER — Ambulatory Visit: Payer: Commercial Managed Care - HMO | Admitting: Nurse Practitioner

## 2022-08-06 VITALS — BP 123/71 | HR 76 | Temp 98.8°F

## 2022-08-06 DIAGNOSIS — R051 Acute cough: Secondary | ICD-10-CM

## 2022-08-06 DIAGNOSIS — U071 COVID-19: Secondary | ICD-10-CM | POA: Diagnosis not present

## 2022-08-06 MED ORDER — AZITHROMYCIN 250 MG PO TABS: ORAL_TABLET | ORAL | 1 refills | Status: DC

## 2022-08-06 MED ORDER — MOLNUPIRAVIR EUA 200MG CAPSULE: 4.0000 | ORAL_CAPSULE | Freq: Two times a day (BID) | ORAL | 0 refills | Status: AC

## 2022-08-06 MED ORDER — DEXAMETHASONE 1 MG PO TABS: ORAL_TABLET | ORAL | 0 refills | Status: DC

## 2022-08-06 MED ORDER — PROMETHAZINE-DM 6.25-15 MG/5ML PO SYRP: 5.0000 mL | ORAL_SOLUTION | Freq: Four times a day (QID) | ORAL | 0 refills | Status: DC | PRN

## 2022-08-06 NOTE — Patient Instructions (Signed)
Immue support reviewed , VitD, Vit C and zinc Take tylenol PRN temp 101+ Push hydration Regular ambulation or calf exercises exercises for clot prevention and 81 mg ASA unless contraindicated Sx supportive therapy suggested Follow up via mychart or telephone if needed Advised patient obtain O2 monitor; present to ED if persistently <90% or with severe dyspnea, CP, fever uncontrolled by tylenol, confusion, sudden decline  Should remain in isolation 5 days from testing positive and then wear a mask when around other people for the following 5 days

## 2022-10-13 DIAGNOSIS — H43813 Vitreous degeneration, bilateral: Secondary | ICD-10-CM | POA: Diagnosis not present

## 2022-10-13 DIAGNOSIS — H18593 Other hereditary corneal dystrophies, bilateral: Secondary | ICD-10-CM | POA: Diagnosis not present

## 2022-10-13 DIAGNOSIS — D3132 Benign neoplasm of left choroid: Secondary | ICD-10-CM | POA: Diagnosis not present

## 2022-10-13 DIAGNOSIS — H40013 Open angle with borderline findings, low risk, bilateral: Secondary | ICD-10-CM | POA: Diagnosis not present

## 2022-10-13 DIAGNOSIS — H2511 Age-related nuclear cataract, right eye: Secondary | ICD-10-CM | POA: Diagnosis not present

## 2022-10-15 ENCOUNTER — Other Ambulatory Visit: Payer: Self-pay | Admitting: Nurse Practitioner

## 2022-10-15 ENCOUNTER — Telehealth: Payer: Self-pay | Admitting: Nurse Practitioner

## 2022-10-15 DIAGNOSIS — F419 Anxiety disorder, unspecified: Secondary | ICD-10-CM

## 2022-10-15 MED ORDER — SERTRALINE HCL 100 MG PO TABS: ORAL_TABLET | ORAL | 3 refills | Status: DC

## 2022-10-15 NOTE — Telephone Encounter (Signed)
Patient has had trouble getting in touch with her pharmacy to refill her Sertraline. She only has 2 pills left. Will you send rx to [facility] in [place]?

## 2022-10-27 NOTE — Progress Notes (Unsigned)
MEDICARE ANNUAL WELLNESS VISIT AND FOLLOW UP  Assessment:   [name] was seen today for medicare wellness.  Diagnoses and all orders for this visit:  Welcome to Medicare preventive visit DEXA ordered Mammogram is scheduled GYN appt scheduled Colonoscopy UTD  Anxiety Continue medications Continue diet, exercise and good sleep hygiene  Elevated BP without diagnosis of hypertension Currently controlled without medications - continue DASH diet, exercise and monitor at home. Call if greater than 130/80.   Hyperlipidemia, mixed Continue Rosuvastatin , diet and exercise -     CBC with Differential/Platelet -     COMPLETE METABOLIC PANEL WITH GFR -     Lipid panel  Vitamin D deficiency Continue Vit D supplementation to maintain value in therapeutic level of 60-100   Abnormal glucose Continue diet and exercise -     COMPLETE METABOLIC PANEL WITH GFR  Medication management -     CBC with Differential/Platelet -     COMPLETE METABOLIC PANEL WITH GFR -     Lipid panel -     TSH  Hypothyroidism, unspecified type Please take your thyroid medication greater than 30 min before breakfast, separated by at least 4 hours  from antacids, calcium, iron, and multivitamins.  -     TSH  Gastroesophageal reflux disease with esophagitis without hemorrhage Continue Omeprazole and behavior modifications  Screening for ischemic heart disease - EKG  Screening for AAA (aortic abdominal aneurysm) - U/S ABD Retroperitoneal LTD   Osteopenia after menopause Continue Vit D and weight bearing exercises -     DG Bone Density; Future  Great toe pain, left Begin Prednisone and will treat further pending results -     Uric acid -     DG Toe Great Left; Future -     predniSONE (DELTASONE) 20 MG tablet; 3 tablets daily with food for 3 days, 2 tabs daily for 3 days, 1 tab a day for 5 days.    Over 40 minutes of exam, counseling, chart review and critical decision making was performed Future  Appointments  Date Time Provider Department Center  01/28/2023 10:00 AM Lucky Cowboy, MD [facility] None     Plan:   During the course of the visit the patient was educated and counseled about appropriate screening and preventive services including:   Pneumococcal vaccine  Prevnar 13 Influenza vaccine Td vaccine Screening electrocardiogram Bone densitometry screening Colorectal cancer screening Diabetes screening Glaucoma screening Nutrition counseling  Advanced directives: requested   Subjective:  Kathryn [name] is a 67 y.o. female who presents for Medicare Annual Wellness Visit and 3 month follow up.   She started having pain in the left great toe 3 days again.  It hurts when she walks and describes as a sharp pain. Took Meloxicam yesterday and did help a little   Her blood pressure has been controlled at home, today their BP is BP: 112/64  BP Readings from Last 3 Encounters:  10/28/22 112/64  08/06/22 123/71  07/19/22 118/70  She does workout. She denies chest pain, shortness of breath, dizziness.   BMI is Body mass index is 22.12 kg/m., she has not been working on diet and exercise. Wt Readings from Last 3 Encounters:  10/28/22 141 lb 3.2 oz (64 kg)  07/19/22 140 lb (63.5 kg)  07/01/22 138 lb (62.6 kg)    She is on cholesterol medication, Rosuvastatin 10 mg QD and denies myalgias. Her cholesterol is at goal. The cholesterol last visit was:   Lab Results  Component Value Date  CHOL 171 07/19/2022   HDL 58 07/19/2022   LDLCALC 86 07/19/2022   LDLDIRECT 130.8 01/08/2013   TRIG 168 (H) 07/19/2022   CHOLHDL 2.9 07/19/2022   She has been working on diet and exercise for abnormal glucose. Last A1C in the office was:  Lab Results  Component Value Date   HGBA1C 5.8 (H) 07/19/2022   Last GFR:  She is trying to push water throughout the day Lab Results  Component Value Date   EGFR 78 07/19/2022    Patient is on Vitamin D supplement.   Lab Results   Component Value Date   VD25OH 56 07/19/2022      Medication Review: Current Outpatient Medications on File Prior to Visit  Medication Sig Dispense Refill   albuterol (VENTOLIN HFA) 108 (90 Base) MCG/ACT inhaler Inhale 2 puffs into the lungs every 6 (six) hours as needed for wheezing or shortness of breath. 8 g 2   famotidine (PEPCID) 20 MG tablet Take 20 mg by mouth daily. Prior to dinner. Gets OTC     ibuprofen (ADVIL) 200 MG tablet Take 200 mg by mouth every 6 (six) hours as needed.     levothyroxine (SYNTHROID) 25 MCG tablet Take  1 tablet  Daily  on an empty stomach with only water for 30 minutes & no Antacid meds, Calcium or Magnesium for 4 hours & avoid Biotin 90 tablet 3   omeprazole (PRILOSEC) 40 MG capsule Take 1 capsule Daily t0 Prevent Heartburn & Indigestion                                                                 /                                              TAKE                                           BY                                    MOUTH 90 capsule 3   Probiotic Product (PROBIOTIC PO) Take by mouth.     rosuvastatin (CRESTOR) 10 MG tablet Take  1 tablet  Daily for Cholesterol                                                                    /                                                TAKE  BY                                            MOUTH 90 tablet 3   sertraline (ZOLOFT) 100 MG tablet Take 1 & 1/2 tablet (150 mg)  Daily  for Mood                                             /                                   TAKE                       BY                    MOUTH 135 tablet 3   Zinc 50 MG CAPS Take by mouth daily. Couple days a week     Acetaminophen (TYLENOL PO) Take by mouth. (Patient not taking: Reported on 10/28/2022)     azithromycin (ZITHROMAX) 250 MG tablet Take 2 tablets (500 mg) on  Day 1,  followed by 1 tablet (250 mg) once daily on Days 2 through 5. (Patient not taking: Reported on 10/28/2022) 6  each 1   dexamethasone (DECADRON) 1 MG tablet Take 3 tabs for 3 days, 2 tabs for 3 days 1 tab for 5 days. Take with food. (Patient not taking: Reported on 10/28/2022) 20 tablet 0   fluticasone (FLONASE) 50 MCG/ACT nasal spray SHAKE LQ AND U 1 SPR IEN D (Patient not taking: Reported on 08/06/2022)     promethazine-dextromethorphan (PROMETHAZINE-DM) 6.25-15 MG/5ML syrup Take 5 mLs by mouth 4 (four) times daily as needed for cough. (Patient not taking: Reported on 10/28/2022) 240 mL 0   sucralfate (CARAFATE) 1 g tablet Take 1 tablet (1 g total) by mouth 4 (four) times daily. (Patient not taking: Reported on 10/28/2022) 120 tablet 1   No current facility-administered medications on file prior to visit.    Allergies  Allergen Reactions   Amoxil [Amoxicillin] Itching   Penicillins Rash    ? Rash as child    Current Problems (verified) Patient Active Problem List   Diagnosis Date Noted   Iron deficiency 12/12/2020   Abnormal glucose 03/20/2020   B12 deficiency 03/20/2020   Vitamin D deficiency 03/20/2020   Hyperlipidemia, mixed 12/06/2014   Subacromial bursitis 11/02/2013   Chronic venous insufficiency 08/19/2013   GERD 11/02/2007   DEGENERATIVE JOINT DISEASE 11/02/2007   Chronic anxiety 10/31/2007    Screening Tests Immunization History  Administered Date(s) Administered   Influenza,inj,Quad PF,6+ Mos 04/04/2017, 02/22/2019, 06/18/2021, 04/13/2022   Influenza-Unspecified 04/06/2013, 04/28/2018   PPD Test 12/03/2019, 01/01/2022   Td 10/03/2006   Tdap 04/04/2017    Health Maintenance  Topic Date Due   Pneumonia Vaccine 71+ Years old (1 of 1 - PCV) 10/01/2022   COVID-19 Vaccine (1) 12/28/2022 (Originally 10/01/1960)   Zoster Vaccines- Shingrix (1 of 2) 01/28/2023 (Originally 09/30/1974)   INFLUENZA VACCINE  01/27/2023   MAMMOGRAM  10/02/2023   PAP SMEAR-Modifier  11/05/2023   DTaP/Tdap/Td (3 - Td or Tdap) 04/05/2027   DEXA SCAN  Completed   Hepatitis C Screening  Completed   HIV  Screening  Completed   HPV VACCINES  Aged Out   Fecal DNA (Cologuard)  Discontinued  Colonoscopy UTD 03/2019 due 2025 Mammogram scheduled for 11/10/22   Names of Other Physician/Practitioners you currently use: 1. [facility] here for primary care 2. Dr. Dione Booze, eye doctor, last visit 09/2022 3. , dentist, last visit uncertain, overdue, no current dentist Patient Care Team: Lucky Cowboy, MD as PCP - General (Internal Medicine)  SURGICAL HISTORY She  has a past surgical history that includes Tubal ligation; Appendectomy; Colonoscopy; and ORIF finger fracture (09/03/2011). FAMILY HISTORY Her family history includes Breast cancer in an other family member; COPD in her mother; Colitis in her mother; Heart attack (age of onset: 33) in her mother; Heart disease in her maternal grandfather; Thyroid disease in her sister. SOCIAL HISTORY She  reports that she has never smoked. She has never used smokeless tobacco. She reports that she does not drink alcohol and does not use drugs.   MEDICARE WELLNESS OBJECTIVES: Physical activity: Current Exercise Habits: Home exercise routine, Type of exercise: Other - see comments (rebounding), Time (Minutes): 20, Frequency (Times/Week): 3, Weekly Exercise (Minutes/Week): 60, Intensity: Mild, Exercise limited by: orthopedic condition(s) Cardiac risk factors: Cardiac Risk Factors include: advanced age (>86men, >66 women);dyslipidemia Depression/mood screen:      10/28/2022   11:02 AM  Depression screen PHQ 2/9  Decreased Interest 0  Down, Depressed, Hopeless 0  PHQ - 2 Score 0    ADLs:     10/28/2022   11:02 AM 12/31/2021   10:08 PM  In your present state of health, do you have any difficulty performing the following activities:  Hearing? 0 0  Vision? 0 0  Difficulty concentrating or making decisions? 0 0  Walking or climbing stairs? 0 0  Dressing or bathing? 0 0  Doing errands, shopping? 0 0     Cognitive  Testing  Alert? Yes  Normal Appearance?Yes  Oriented to person? Yes  Place? Yes   Time? Yes  Recall of three objects?  Yes  Can perform simple calculations? Yes  Displays appropriate judgment?Yes  Can read the correct time from a watch face?Yes  EOL planning: Does Patient Have a Medical Advance Directive?: No Would patient like information on creating a medical advance directive?: Yes (MAU/Ambulatory/Procedural Areas - Information given)  Review of Systems  Constitutional:  Negative for chills and fever.  HENT:  Negative for congestion, hearing loss, sinus pain, sore throat and tinnitus.   Eyes:  Negative for blurred vision and double vision.  Respiratory:  Negative for cough, hemoptysis, sputum production, shortness of breath and wheezing.   Cardiovascular:  Negative for chest pain, palpitations and leg swelling.  Gastrointestinal:  Negative for abdominal pain, constipation, diarrhea, heartburn, nausea and vomiting.  Genitourinary:  Negative for dysuria and urgency.  Musculoskeletal:  Positive for joint pain (left great toe swollen and painful). Negative for back pain, falls, myalgias and neck pain.  Skin:  Negative for rash.  Neurological:  Negative for dizziness, tingling, tremors, weakness and headaches.  Endo/Heme/Allergies:  Does not bruise/bleed easily.  Psychiatric/Behavioral:  Negative for depression and suicidal ideas. The patient is not nervous/anxious and does not have insomnia.      Objective:     Today's Vitals   10/28/22 1044  BP: 112/64  Pulse: 89  Temp: 97.9 F (36.6 C)  SpO2: 97%  Weight: 141 lb 3.2 oz (64 kg)  Height: 5\' 7"  (1.702 m)   Body  mass index is 22.12 kg/m.  General appearance: alert, no distress, WD/WN, female HEENT: normocephalic, sclerae anicteric, TMs pearly, nares patent, no discharge or erythema, pharynx normal Oral cavity: MMM, no lesions Neck: supple, no lymphadenopathy, no thyromegaly, no masses Heart: RRR, normal S1, S2, no  murmurs Lungs: CTA bilaterally, no wheezes, rhonchi, or rales Abdomen: +bs, soft, non tender, non distended, no masses, no hepatomegaly, no splenomegaly Musculoskeletal: nontender, no swelling. Left great toe at first joint is swollen with mild erythema and tenderness to touch Extremities: no edema, no cyanosis, no clubbing Pulses: 2+ symmetric, upper and lower extremities, normal cap refill Neurological: alert, oriented x 3, CN2-12 intact, strength normal upper extremities and lower extremities, sensation normal throughout, DTRs 2+ throughout, no cerebellar signs, gait normal Psychiatric: normal affect, behavior normal, pleasant  EKG: NSR, No ST Changes AAA: < 3 cm  Medicare Attestation I have personally reviewed: The patient's medical and social history Their use of alcohol, tobacco or illicit drugs Their current medications and supplements The patient's functional ability including ADLs,fall risks, home safety risks, cognitive, and hearing and visual impairment Diet and physical activities Evidence for depression or mood disorders  The patient's weight, height, BMI, and visual acuity have been recorded in the chart.  I have made referrals, counseling, and provided education to the patient based on review of the above and I have provided the patient with a written personalized care plan for preventive services.     Raynelle Dick, NP   10/28/2022

## 2022-10-28 ENCOUNTER — Encounter: Payer: Self-pay | Admitting: Nurse Practitioner

## 2022-10-28 ENCOUNTER — Ambulatory Visit (INDEPENDENT_AMBULATORY_CARE_PROVIDER_SITE_OTHER): Payer: Medicare HMO | Admitting: Nurse Practitioner

## 2022-10-28 ENCOUNTER — Ambulatory Visit
Admission: RE | Admit: 2022-10-28 | Discharge: 2022-10-28 | Disposition: A | Discharge status: Home / Self Care | Payer: Medicare HMO | Source: Ambulatory Visit | Attending: Nurse Practitioner | Admitting: Nurse Practitioner

## 2022-10-28 VITALS — BP 112/64 | HR 89 | Temp 97.9°F | Ht 67.0 in | Wt 141.2 lb

## 2022-10-28 DIAGNOSIS — Z0000 Encounter for general adult medical examination without abnormal findings: Secondary | ICD-10-CM

## 2022-10-28 DIAGNOSIS — E782 Mixed hyperlipidemia: Secondary | ICD-10-CM | POA: Diagnosis not present

## 2022-10-28 DIAGNOSIS — E559 Vitamin D deficiency, unspecified: Secondary | ICD-10-CM

## 2022-10-28 DIAGNOSIS — R030 Elevated blood-pressure reading, without diagnosis of hypertension: Secondary | ICD-10-CM

## 2022-10-28 DIAGNOSIS — I10 Essential (primary) hypertension: Secondary | ICD-10-CM | POA: Diagnosis not present

## 2022-10-28 DIAGNOSIS — R7309 Other abnormal glucose: Secondary | ICD-10-CM | POA: Diagnosis not present

## 2022-10-28 DIAGNOSIS — M79672 Pain in left foot: Secondary | ICD-10-CM | POA: Diagnosis not present

## 2022-10-28 DIAGNOSIS — F419 Anxiety disorder, unspecified: Secondary | ICD-10-CM

## 2022-10-28 DIAGNOSIS — Z136 Encounter for screening for cardiovascular disorders: Secondary | ICD-10-CM | POA: Diagnosis not present

## 2022-10-28 DIAGNOSIS — M79675 Pain in left toe(s): Secondary | ICD-10-CM

## 2022-10-28 DIAGNOSIS — E538 Deficiency of other specified B group vitamins: Secondary | ICD-10-CM

## 2022-10-28 DIAGNOSIS — Z780 Asymptomatic menopausal state: Secondary | ICD-10-CM

## 2022-10-28 DIAGNOSIS — E039 Hypothyroidism, unspecified: Secondary | ICD-10-CM | POA: Diagnosis not present

## 2022-10-28 DIAGNOSIS — I700 Atherosclerosis of aorta: Secondary | ICD-10-CM

## 2022-10-28 DIAGNOSIS — K2100 Gastro-esophageal reflux disease with esophagitis, without bleeding: Secondary | ICD-10-CM

## 2022-10-28 DIAGNOSIS — Z79899 Other long term (current) drug therapy: Secondary | ICD-10-CM | POA: Diagnosis not present

## 2022-10-28 LAB — CBC WITH DIFFERENTIAL/PLATELET
Absolute Monocytes: 343 cells/uL (ref 200–950)
Lymphs Abs: 1475 cells/uL (ref 850–3900)
MCH: 29.2 pg (ref 27.0–33.0)
MPV: 12.2 fL (ref 7.5–12.5)
Monocytes Relative: 7 %
Platelets: 203 10*3/uL (ref 140–400)

## 2022-10-28 MED ORDER — PREDNISONE 20 MG PO TABS
ORAL_TABLET | ORAL | 0 refills | Status: AC
Start: 2022-10-28 — End: 2022-11-08

## 2022-10-28 NOTE — Patient Instructions (Signed)
Foot Pain Many things can cause foot pain. Common causes include injuries to the foot. The injuries include sprains or broken bones, or injuries that affect the nerves in the feet. Other causes of foot pain include arthritis, blisters, and bunions. To know what causes your foot pain, your health care provider will take a detailed history of your symptoms. They will also do a physical exam as well as imaging tests, such as X-ray or MRI. Follow these instructions at home: Managing pain, stiffness, and swelling  If told, put ice on the painful area. Put ice in a plastic bag. Place a towel between your skin and the bag. Leave the ice on for 20 minutes, 2-3 times a day. If your skin turns bright red, remove the ice right away to prevent skin damage. The risk of damage is higher if you cannot feel pain, heat, or cold. Activity Do not stand or walk for long periods. Do stretches to relieve foot pain and stiffness as told by your provider. Do not lift anything that is heavier than 10 lb (4.5 kg), or the limit that you are told, until your provider says that it is safe. Lifting a lot of weight can put added pressure on your feet. Return to your normal activities as told by your provider. Ask your provider what activities are safe for you. Lifestyle Wear comfortable, supportive shoes that fit you well. Do not wear high heels. Keep your feet clean and dry. General instructions Take over-the-counter and prescription medicines only as told by your provider. Rub your foot gently. Pay attention to any changes in your symptoms. Let your provider know if symptoms become worse. Keep all follow-up visits. Your provider will want to monitor your progress. Contact a health care provider if: Your pain does not get better after a few days of treatment at home. Your pain gets worse. You cannot stand on your foot. Your foot or toes are swollen. Your foot is numb or tingling. Get help right away if: Your foot  or toes turn white or blue. You have warmth and redness along your foot. This information is not intended to replace advice given to you by your health care provider. Make sure you discuss any questions you have with your health care provider. Document Revised: 03/16/2022 Document Reviewed: 03/16/2022 Elsevier Patient Education  2023 [employer]  

## 2022-10-29 LAB — CBC WITH DIFFERENTIAL/PLATELET
Basophils Absolute: 0 cells/uL (ref 0–200)
Basophils Relative: 0 %
Eosinophils Absolute: 93 cells/uL (ref 15–500)
Eosinophils Relative: 1.9 %
HCT: 36.3 % (ref 35.0–45.0)
Hemoglobin: 12.1 g/dL (ref 11.7–15.5)
MCHC: 33.3 g/dL (ref 32.0–36.0)
MCV: 87.7 fL (ref 80.0–100.0)
Neutro Abs: 2989 cells/uL (ref 1500–7800)
Neutrophils Relative %: 61 %
RBC: 4.14 10*6/uL (ref 3.80–5.10)
RDW: 12.7 % (ref 11.0–15.0)
Total Lymphocyte: 30.1 %
WBC: 4.9 10*3/uL (ref 3.8–10.8)

## 2022-10-29 LAB — COMPLETE METABOLIC PANEL WITH GFR
AG Ratio: 1.9 (calc) (ref 1.0–2.5)
ALT: 12 U/L (ref 6–29)
AST: 24 U/L (ref 10–35)
Albumin: 4.4 g/dL (ref 3.6–5.1)
Alkaline phosphatase (APISO): 76 U/L (ref 37–153)
BUN: 12 mg/dL (ref 7–25)
CO2: 28 mmol/L (ref 20–32)
Calcium: 9.6 mg/dL (ref 8.6–10.4)
Chloride: 104 mmol/L (ref 98–110)
Creat: 0.97 mg/dL (ref 0.50–1.05)
Globulin: 2.3 g/dL (calc) (ref 1.9–3.7)
Glucose, Bld: 70 mg/dL (ref 65–99)
Potassium: 5 mmol/L (ref 3.5–5.3)
Sodium: 142 mmol/L (ref 135–146)
Total Bilirubin: 0.4 mg/dL (ref 0.2–1.2)
Total Protein: 6.7 g/dL (ref 6.1–8.1)
eGFR: 65 mL/min/{1.73_m2} (ref >=60)

## 2022-10-29 LAB — LIPID PANEL
Cholesterol: 157 mg/dL (ref <200)
HDL: 50 mg/dL (ref >=50)
LDL Cholesterol (Calc): 72 mg/dL (calc)
Non-HDL Cholesterol (Calc): 107 mg/dL (calc) (ref <130)
Total CHOL/HDL Ratio: 3.1 (calc) (ref <5.0)
Triglycerides: 294 mg/dL — ABNORMAL HIGH (ref <150)

## 2022-10-29 LAB — TSH: TSH: 2.07 mIU/L (ref 0.40–4.50)

## 2022-10-29 LAB — URIC ACID: Uric Acid, Serum: 4.4 mg/dL (ref 2.5–7.0)

## 2022-10-29 NOTE — Addendum Note (Signed)
Addended by: Anda Kraft E on: 10/29/2022 11:37 AM   Modules accepted: Orders

## 2022-11-08 ENCOUNTER — Other Ambulatory Visit: Payer: Self-pay | Admitting: Internal Medicine

## 2022-11-08 ENCOUNTER — Other Ambulatory Visit: Payer: Self-pay | Admitting: Nurse Practitioner

## 2022-11-08 DIAGNOSIS — K2100 Gastro-esophageal reflux disease with esophagitis, without bleeding: Secondary | ICD-10-CM

## 2022-11-08 DIAGNOSIS — E039 Hypothyroidism, unspecified: Secondary | ICD-10-CM

## 2022-11-16 DIAGNOSIS — Z1231 Encounter for screening mammogram for malignant neoplasm of breast: Secondary | ICD-10-CM | POA: Diagnosis not present

## 2022-12-06 ENCOUNTER — Other Ambulatory Visit: Payer: Self-pay | Admitting: Internal Medicine

## 2022-12-06 DIAGNOSIS — E782 Mixed hyperlipidemia: Secondary | ICD-10-CM

## 2023-01-21 ENCOUNTER — Encounter: Payer: Medicare HMO | Admitting: Internal Medicine

## 2023-01-27 NOTE — Progress Notes (Signed)
\ Annual Screening/Preventative Visit & Comprehensive Evaluation &  Examination   Future Appointments  Date Time Provider Department  11/19/2022  Welcome to [facility]     01/28/2023 10:00 AM Lucky Cowboy, MD [facility]  02/09/2024 10:00 AM Lucky Cowboy, MD [facility]        This very nice 67 y.o. MWF presents for a Screening /Preventative Visit & comprehensive evaluation and management of multiple medical co-morbidities.  Patient has been followed for BP monitoring,  HLD, Prediabetes  and Vitamin D Deficiency. Patient's GERD is controlled on Omeprazole qam/famotidine qpm.         Patient is monitored proactively for elevated BP.  Patient's BP has been controlled at home and patient denies any cardiac symptoms as chest pain, palpitations, shortness of breath, dizziness or ankle swelling. Today's BP is  121/72.         Patient's hyperlipidemia is controlled with diet  & Rosuvastatin .   Last lipids are at goal except elevated Trig's :  Lab Results  Component Value Date   CHOL 157 10/28/2022   HDL 50 10/28/2022   LDLCALC 72 10/28/2022   LDLDIRECT 130.8 01/08/2013   TRIG 294 (H) 10/28/2022   CHOLHDL 3.1 10/28/2022         Patient has hx/o prediabetes (A1c 5.8% /2019) and patient denies reactive hypoglycemic symptoms, visual blurring, diabetic polys or paresthesias. Last A1c was normal & at goal:  Lab Results  Component Value Date   HGBA1C 5.8 (H) 07/19/2022         Last June,   patient had a low level of Vitamin B12 at "396".  Also, patient has history of Vitamin D Deficiency and currentVitamin D  on 5,000 units daily replacement is at goal :  Lab Results  Component Value Date   VD25OH 96 01/28/2023      Current Outpatient Medications  Medication Instructions   albuterol  HFA inhaler 2 Inhalations   Every 6 hours PRN   famotidine 20 mg   Daily   FLONASE  nasal spray prn   ibuprofen    200 mg  PRN   levothyroxine  25 MCG tablet TAKE 1 TABLET DAILY     omeprazole  40 MG capsule TAKE 1 CAPSULE DAILY    PROBIOTIC Daily   rosuvastatin 10 MG tablet TAKE 1 TABLET  DAILY    sertraline 100 MG tablet Take 1 & 1/2 tablet (150 mg)  Daily     Sucralfate   1 g               4 times daily   Zinc 50 MG CAPS Daily     Allergies  Allergen Reactions   Amoxil [Amoxicillin] Itching   Penicillins Rash    ? Rash as child    Past Medical History:  Diagnosis Date   Anxiety    Diverticulitis 2016   Fracture of fifth metacarpal bone of left hand 09/03/2011   post fall   GERD (gastroesophageal reflux disease)    Osteoarthritis     Health Maintenance  Topic Date Due   Pneumococcal Vaccine 71-16 Years old (1 - PCV) Never done   Zoster Vaccines- Shingrix (1 of 2) Never done   MAMMOGRAM  07/09/2019   PAP SMEAR-Modifier  07/13/2020   Fecal DNA (Cologuard)  07/15/2020   INFLUENZA VACCINE  01/26/2021   TETANUS/TDAP  04/05/2027   Hepatitis C Screening  Completed   HIV Screening  Completed   HPV VACCINES  Aged  Out     Immunization History  Administered Date(s) Administered   Influenza,inj,Quad PF,6+ Mos 04/04/2017   Influenza-Unspecified 04/06/2013, 04/28/2018   PPD Test 12/03/2019   Td 10/03/2006   Tdap 04/04/2017     Last Colon -  Dec 2020 - at [facility] - had a negative  EGD, Capsule Endoscopy & Colonoscopy to evaluate for Iron deficiency (w/u was negative and Low iron most likely due to her Proilosec)   Last MGM and dexaBMD - 06/2019 at [facility]   Past Surgical History:  Procedure Laterality Date   APPENDECTOMY     COLONOSCOPY     negative; Dr Jarold Motto   ORIF FINGER FRACTURE  09/03/2011   Procedure: OPEN REDUCTION INTERNAL FIXATION (ORIF) METACARPAL (FINGER) FRACTURE;  Surgeon: Eulas Post, MD;  Location: [name];  Service: Orthopedics;  Laterality: Left;   TUBAL LIGATION       Family History  Problem Relation Age of Onset   Colitis Mother    COPD Mother    Heart attack Mother 49    Heart disease Maternal Grandfather    Breast cancer Other        MGaunt   Thyroid disease Sister    Diabetes Neg Hx    Stroke Neg Hx    Colon cancer Neg Hx    Colon polyps Neg Hx    Kidney disease Neg Hx    Gallbladder disease Neg Hx    Esophageal cancer Neg Hx     Social History   Tobacco Use   Smoking status: Never   Smokeless tobacco: Never  Substance Use Topics   Alcohol use: No    Alcohol/week: 0.0 standard drinks   Drug use: No      ROS  Constitutional: Denies fever, chills, weight loss/gain, headaches, insomnia,  night sweats, and change in appetite. Does c/o fatigue. Eyes: Denies redness, blurred vision, diplopia, discharge, itchy, watery eyes.  ENT: Denies discharge, congestion, post nasal drip, epistaxis, sore throat, earache, hearing loss, dental pain, Tinnitus, Vertigo, Sinus pain, snoring.  Cardio: Denies chest pain, palpitations, irregular heartbeat, syncope, dyspnea, diaphoresis, orthopnea, PND, claudication, edema Respiratory: denies cough, dyspnea, DOE, pleurisy, hoarseness, laryngitis, wheezing.  Gastrointestinal: Denies dysphagia, heartburn, reflux, water brash, pain, cramps, nausea, vomiting, bloating, diarrhea, constipation, hematemesis, melena, hematochezia, jaundice, hemorrhoids Genitourinary: Denies dysuria, frequency, urgency, nocturia, hesitancy, discharge, hematuria, flank pain Breast: Breast lumps, nipple discharge, bleeding.  Musculoskeletal: Denies arthralgia, myalgia, stiffness, Jt. Swelling, pain, limp, and strain/sprain. Denies falls. Skin: Denies puritis, rash, hives, warts, acne, eczema, changing in skin lesion Neuro: No weakness, tremor, incoordination, spasms, paresthesia, pain Psychiatric: Denies confusion, memory loss, sensory loss. Denies Depression. Endocrine: Denies change in weight, skin, hair change, nocturia, and paresthesia, diabetic polys, visual blurring, hyper / hypo glycemic episodes.  Heme/Lymph: No excessive bleeding,  bruising, enlarged lymph nodes.  Physical Exam  BP 121/72   Pulse 86   Temp 97.9 F (36.6 C)   Resp 16   Ht 5\' 7"  (1.702 m)   Wt 144 lb 12.8 oz (65.7 kg)   SpO2 98%   BMI 22.68 kg/m   General Appearance: Well nourished, well groomed and in no apparent distress.  Eyes: PERRLA, EOMs, conjunctiva no swelling or erythema, normal fundi and vessels. Sinuses: No frontal/maxillary tenderness ENT/Mouth: EACs patent / TMs  nl. Nares clear without erythema, swelling, mucoid exudates. Oral hygiene is good. No erythema, swelling, or exudate. Tongue normal, non-obstructing. Tonsils not swollen or erythematous. Hearing normal.  Neck: Supple,  thyroid not palpable. No bruits, nodes or JVD. Respiratory: Respiratory effort normal.  BS equal and clear bilateral without rales, rhonci, wheezing or stridor. Cardio: Heart sounds are normal with regular rate and rhythm and no murmurs, rubs or gallops. Peripheral pulses are normal and equal bilaterally without edema. No aortic or femoral bruits. Chest: symmetric with normal excursions and percussion. Breasts: Symmetric, without lumps, nipple discharge, retractions, or fibrocystic changes.  Abdomen: Flat, soft with bowel sounds active. Nontender, no guarding, rebound, hernias, masses, or organomegaly.  Lymphatics: Non tender without lymphadenopathy.  Genitourinary:  Musculoskeletal: Full ROM all peripheral extremities, joint stability, 5/5 strength, and normal gait. Skin: Warm and dry without rashes, lesions, cyanosis, clubbing or  ecchymosis.  Neuro: Cranial nerves intact, reflexes equal bilaterally. Normal muscle tone, no cerebellar symptoms. Sensation intact.  Pysch: Alert and oriented X 3, normal affect, Insight and Judgment appropriate.    Assessment and Plan  1. Annual Preventative Screening Examination   2. Elevated BP without diagnosis of hypertension  - EKG 12-Lead - [place], RETROPERITNL ABD,  LTD - Urinalysis, Routine w reflex microscopic -  Microalbumin / creatinine urine ratio - CBC with Differential/Platelet - COMPLETE METABOLIC PANEL WITH GFR - Magnesium - TSH  3. Hyperlipidemia, mixed  - EKG 12-Lead - [place], RETROPERITNL ABD,  LTD - Lipid panel - TSH  4. Abnormal glucose  - EKG 12-Lead - [place], RETROPERITNL ABD,  LTD - Hemoglobin A1c - Insulin, random  5. Vitamin D deficiency  - VITAMIN D 25 Hydroxy   6. Iron deficiency  - Iron, Total/Total Iron Binding Cap  7. B12 deficiency  - Vitamin B12  8. Screening-pulmonary TB  - TB Skin Test  9. Screening for colorectal cancer  - POC Hemoccult Bld/Stl   10. Screening for heart disease  - EKG 12-Lead  11. FHx: heart disease  - EKG 12-Lead - [place], RETROPERITNL ABD,  LTD  12. Screening for AAA (aortic abdominal aneurysm)  - [place], RETROPERITNL ABD,  LTD  13. Fatigue  - Iron, Total/Total Iron Binding Cap - Vitamin B12 - CBC with Differential/Platelet - TSH  14. Medication management  - Urinalysis, Routine w reflex microscopic - Microalbumin / creatinine urine ratio - CBC with Differential/Platelet - COMPLETE METABOLIC PANEL WITH GFR - Magnesium - Lipid panel - TSH - Hemoglobin A1c - Insulin, random - VITAMIN D 25 Hydroxy          Patient was counseled in prudent diet to achieve/maintain BMI less than 25 for weight control, BP monitoring, regular exercise and medications. Discussed med's effects and SE's. Screening labs and tests as requested with regular follow-up as recommended. Over 40 minutes of exam, counseling, chart review and high complex critical decision making was performed.   Marinus Maw, MD

## 2023-01-28 ENCOUNTER — Encounter: Payer: Self-pay | Admitting: Internal Medicine

## 2023-01-28 ENCOUNTER — Ambulatory Visit (INDEPENDENT_AMBULATORY_CARE_PROVIDER_SITE_OTHER): Payer: Medicare HMO | Admitting: Internal Medicine

## 2023-01-28 VITALS — BP 121/72 | HR 86 | Temp 97.9°F | Resp 16 | Ht 67.0 in | Wt 144.8 lb

## 2023-01-28 DIAGNOSIS — K2100 Gastro-esophageal reflux disease with esophagitis, without bleeding: Secondary | ICD-10-CM | POA: Diagnosis not present

## 2023-01-28 DIAGNOSIS — Z1211 Encounter for screening for malignant neoplasm of colon: Secondary | ICD-10-CM

## 2023-01-28 DIAGNOSIS — Z136 Encounter for screening for cardiovascular disorders: Secondary | ICD-10-CM

## 2023-01-28 DIAGNOSIS — E538 Deficiency of other specified B group vitamins: Secondary | ICD-10-CM | POA: Diagnosis not present

## 2023-01-28 DIAGNOSIS — E039 Hypothyroidism, unspecified: Secondary | ICD-10-CM

## 2023-01-28 DIAGNOSIS — E559 Vitamin D deficiency, unspecified: Secondary | ICD-10-CM

## 2023-01-28 DIAGNOSIS — Z79899 Other long term (current) drug therapy: Secondary | ICD-10-CM | POA: Diagnosis not present

## 2023-01-28 DIAGNOSIS — Z0000 Encounter for general adult medical examination without abnormal findings: Secondary | ICD-10-CM

## 2023-01-28 DIAGNOSIS — R030 Elevated blood-pressure reading, without diagnosis of hypertension: Secondary | ICD-10-CM

## 2023-01-28 DIAGNOSIS — R7309 Other abnormal glucose: Secondary | ICD-10-CM

## 2023-01-28 DIAGNOSIS — E782 Mixed hyperlipidemia: Secondary | ICD-10-CM | POA: Diagnosis not present

## 2023-01-28 DIAGNOSIS — Z0001 Encounter for general adult medical examination with abnormal findings: Secondary | ICD-10-CM

## 2023-01-28 LAB — CBC WITH DIFFERENTIAL/PLATELET
Absolute Monocytes: 342 cells/uL (ref 200–950)
Basophils Absolute: 10 cells/uL (ref 0–200)
Basophils Relative: 0.2 %
Eosinophils Absolute: 82 cells/uL (ref 15–500)
Eosinophils Relative: 1.6 %
HCT: 36.2 % (ref 35.0–45.0)
Hemoglobin: 11.9 g/dL (ref 11.7–15.5)
Lymphs Abs: 1275 cells/uL (ref 850–3900)
MCH: 29.6 pg (ref 27.0–33.0)
MCHC: 32.9 g/dL (ref 32.0–36.0)
MCV: 90 fL (ref 80.0–100.0)
MPV: 12.4 fL (ref 7.5–12.5)
Monocytes Relative: 6.7 %
Neutro Abs: 3392 cells/uL (ref 1500–7800)
Neutrophils Relative %: 66.5 %
Platelets: 196 10*3/uL (ref 140–400)
RBC: 4.02 10*6/uL (ref 3.80–5.10)
RDW: 12.7 % (ref 11.0–15.0)
Total Lymphocyte: 25 %
WBC: 5.1 10*3/uL (ref 3.8–10.8)

## 2023-01-28 MED ORDER — DEXAMETHASONE 4 MG PO TABS: ORAL_TABLET | ORAL | 0 refills | Status: DC

## 2023-01-28 MED ORDER — AZITHROMYCIN 250 MG PO TABS: ORAL_TABLET | ORAL | 1 refills | Status: DC

## 2023-01-28 NOTE — Patient Instructions (Signed)
Due to recent changes in healthcare laws, you may see the results of your imaging and laboratory studies on MyChart before your provider has had a chance to review them.  We understand that in some cases there may be results that are confusing or concerning to you. Not all laboratory results come back in the same time frame and the provider may be waiting for multiple results in order to interpret others.  Please give us 48 hours in order for your provider to thoroughly review all the results before contacting the office for clarification of your results.   +++++++++++++++++++++++++  Vit D  & Vit C 1,000 mg   are recommended to help protect  against the Covid-19 and other Corona viruses.    Also it's recommended  to take  Zinc 50 mg  to help  protect against the Covid-19   and best place to get  is also on [employer]  and don't pay more than 6-8 cents /pill !  ================================ Coronavirus (COVID-19) Are you at risk?  Are you at risk for the Coronavirus (COVID-19)?  To be considered HIGH RISK for Coronavirus (COVID-19), you have to meet the following criteria:  Traveled to [place], [place], [place], [place] or [place]; or in the [place] to [place], [place], [place]  or [place]; and have fever, cough, and shortness of breath within the last 2 weeks of travel OR Been in close contact with a person diagnosed with COVID-19 within the last 2 weeks and have  fever, cough,and shortness of breath  IF YOU DO NOT MEET THESE CRITERIA, YOU ARE CONSIDERED LOW RISK FOR COVID-19.  What to do if you are HIGH RISK for COVID-19?  If you are having a medical emergency, call 911. Seek medical care right away. Before you go to a doctor's office, urgent care or emergency department,  call ahead and tell them about your recent travel, contact with someone diagnosed with COVID-19   and your symptoms.  You should receive instructions from your physician's office regarding next  steps of care.  When you arrive at healthcare provider, tell the healthcare staff immediately you have returned from  visiting [place], [place], [place], [place] or [place]; or traveled in the [place] to [place], [place],  [place] or [place] in the last two weeks or you have been in close contact with a person diagnosed with  COVID-19 in the last 2 weeks.   Tell the health care staff about your symptoms: fever, cough and shortness of breath. After you have been seen by a medical provider, you will be either: Tested for (COVID-19) and discharged home on quarantine except to seek medical care if  symptoms worsen, and asked to  Stay home and avoid contact with others until you get your results (4-5 days)  Avoid travel on public transportation if possible (such as bus, train, or airplane) or Sent to the Emergency Department by EMS for evaluation, COVID-19 testing  and  possible admission depending on your condition and test results.  What to do if you are LOW RISK for COVID-19?  Reduce your risk of any infection by using the same precautions used for avoiding the common cold or flu:  Wash your hands often with soap and warm water for at least 20 seconds.  If soap and water are not readily available,  use an alcohol-based hand sanitizer with at least 60% alcohol.  If coughing or sneezing, cover your mouth and nose by coughing   or sneezing into the elbow areas of your shirt or coat,  into a tissue or into your sleeve (not your hands). Avoid shaking hands with others and consider head nods or verbal greetings only. Avoid touching your eyes, nose, or mouth with unwashed hands.  Avoid close contact with people who are sick. Avoid places or events with large numbers of people in one location, like concerts or sporting events. Carefully consider travel plans you have or are making. If you are planning any travel outside or inside the [place], visit the CDC's Travelers' Health webpage for the  latest health notices. If you have some symptoms but not all symptoms, continue to monitor at home and seek medical attention  if your symptoms worsen. If you are having a medical emergency, call 911.   >>>>>>>>>>>>>>>>>>>>>>>>>>>>>>>>>  We Do NOT Approve of [employer] > > > > > > > > > > > > > > > > > > > > > > > > > > > > > > > > > > > > > > >  Preventive Care for Adults  A healthy lifestyle and preventive care can promote health and wellness. Preventive health guidelines for women include the following key practices. A routine yearly physical is a good way to check with your health care provider about your health and preventive screening. It is a chance to share any concerns and updates on your health and to receive a thorough exam. Visit your dentist for a routine exam and preventive care every 6 months. Brush your teeth twice a day and floss once a day. Good oral hygiene prevents tooth decay and gum disease. The frequency of eye exams is based on your age, health, family medical history, use of contact lenses, and other factors. Follow your health care provider's recommendations for frequency of eye exams. Eat a healthy diet. Foods like vegetables, fruits, whole grains, low-fat dairy products, and lean protein foods contain the nutrients you need without too many calories. Decrease your intake of foods high in solid fats, added sugars, and salt. Eat the right amount of calories for you. Get information about a proper diet from your health care provider, if necessary. Regular physical exercise is one of the most important things you can do for your health. Most adults should get at least 150 minutes of moderate-intensity exercise (any activity that increases your heart rate and causes you to sweat) each week. In addition, most adults need muscle-strengthening exercises on 2 or more days a week. Maintain a healthy weight. The body mass index (BMI) is a screening tool to identify  possible weight problems. It provides an estimate of body fat based on height and weight. Your health care provider can find your BMI and can help you achieve or maintain a healthy weight. For adults 20 years and older: A BMI below 18.5 is considered underweight. A BMI of 18.5 to 24.9 is normal. A BMI of 25 to 29.9 is considered overweight. A BMI of 30 and above is considered obese. Maintain normal blood lipids and cholesterol levels by exercising and minimizing your intake of saturated fat. Eat a balanced diet with plenty of fruit and vegetables. If your lipid or cholesterol levels are high, you are over 50, or you are at high risk for heart disease, you may need your cholesterol levels checked more frequently. Ongoing high lipid and cholesterol levels should be treated with medicines if diet and exercise are not working. If you smoke, find out from   your health care provider how to quit. If you do not use tobacco, do not start. Lung cancer screening is recommended for adults aged 55-80 years who are at high risk for developing lung cancer because of a history of smoking. A yearly low-dose CT scan of the lungs is recommended for people who have at least a 30-pack-year history of smoking and are a current smoker or have quit within the past 15 years. A pack year of smoking is smoking an average of 1 pack of cigarettes a day for 1 year (for example: 1 pack a day for 30 years or 2 packs a day for 15 years). Yearly screening should continue until the smoker has stopped smoking for at least 15 years. Yearly screening should be stopped for people who develop a health problem that would prevent them from having lung cancer treatment. Avoid use of street drugs. Do not share needles with anyone. Ask for help if you need support or instructions about stopping the use of drugs. High blood pressure causes heart disease and increases the risk of stroke.  Ongoing high blood pressure should be treated with medicines if  weight loss and exercise do not work. If you are 55-79 years old, ask your health care provider if you should take aspirin to prevent strokes. Diabetes screening involves taking a blood sample to check your fasting blood sugar level. This should be done once every 3 years, after age 45, if you are within normal weight and without risk factors for diabetes. Testing should be considered at a younger age or be carried out more frequently if you are overweight and have at least 1 risk factor for diabetes. Breast cancer screening is essential preventive care for women. You should practice "breast self-awareness." This means understanding the normal appearance and feel of your breasts and may include breast self-examination. Any changes detected, no matter how small, should be reported to a health care provider. Women in their 20s and 30s should have a clinical breast exam (CBE) by a health care provider as part of a regular health exam every 1 to 3 years. After age 40, women should have a CBE every year. Starting at age 40, women should consider having a mammogram (breast X-ray test) every year. Women who have a family history of breast cancer should talk to their health care provider about genetic screening. Women at a high risk of breast cancer should talk to their health care providers about having an MRI and a mammogram every year. Breast cancer gene (BRCA)-related cancer risk assessment is recommended for women who have family members with BRCA-related cancers. BRCA-related cancers include breast, ovarian, tubal, and peritoneal cancers. Having family members with these cancers may be associated with an increased risk for harmful changes (mutations) in the breast cancer genes BRCA1 and BRCA2. Results of the assessment will determine the need for genetic counseling and BRCA1 and BRCA2 testing. Routine pelvic exams to screen for cancer are no longer recommended for nonpregnant women who are considered low risk for  cancer of the pelvic organs (ovaries, uterus, and vagina) and who do not have symptoms. Ask your health care provider if a screening pelvic exam is right for you. If you have had past treatment for cervical cancer or a condition that could lead to cancer, you need Pap tests and screening for cancer for at least 20 years after your treatment. If Pap tests have been discontinued, your risk factors (such as having a new sexual partner) need to be   reassessed to determine if screening should be resumed. Some women have medical problems that increase the chance of getting cervical cancer. In these cases, your health care provider may recommend more frequent screening and Pap tests.  Colorectal cancer can be detected and often prevented. Most routine colorectal cancer screening begins at the age of 9 years and continues through age 47 years. However, your health care provider may recommend screening at an earlier age if you have risk factors for colon cancer. On a yearly basis, your health care provider may provide home test kits to check for hidden blood in the stool. Use of a small camera at the end of a tube, to directly examine the colon (sigmoidoscopy or colonoscopy), can detect the earliest forms of colorectal cancer. Talk to your health care provider about this at age 66, when routine screening begins.  Direct exam of the colon should be repeated every 5-10 years through age 98 years, unless early forms of pre-cancerous polyps or small growths are found. Osteoporosis is a disease in which the bones lose minerals and strength with aging. This can result in serious bone fractures or breaks. The risk of osteoporosis can be identified using a bone density scan. Women ages 48 years and over and women at risk for fractures or osteoporosis should discuss screening with their health care providers. Ask your health care provider whether you should take a calcium supplement or vitamin D to reduce the rate of  osteoporosis. Menopause can be associated with physical symptoms and risks. Hormone replacement therapy is available to decrease symptoms and risks. You should talk to your health care provider about whether hormone replacement therapy is right for you. Use sunscreen. Apply sunscreen liberally and repeatedly throughout the day. You should seek shade when your shadow is shorter than you. Protect yourself by wearing long sleeves, pants, a wide-brimmed hat, and sunglasses year round, whenever you are outdoors. Once a month, do a whole body skin exam, using a mirror to look at the skin on your back. Tell your health care provider of new moles, moles that have irregular borders, moles that are larger than a pencil eraser, or moles that have changed in shape or color. Stay current with required vaccines (immunizations). Influenza vaccine. All adults should be immunized every year. Tetanus, diphtheria, and acellular pertussis (Td, Tdap) vaccine. Pregnant women should receive 1 dose of Tdap vaccine during each pregnancy. The dose should be obtained regardless of the length of time since the last dose. Immunization is preferred during the 27th-36th week of gestation. An adult who has not previously received Tdap or who does not know her vaccine status should receive 1 dose of Tdap. This initial dose should be followed by tetanus and diphtheria toxoids (Td) booster doses every 10 years. Adults with an unknown or incomplete history of completing a 3-dose immunization series with Td-containing vaccines should begin or complete a primary immunization series including a Tdap dose. Adults should receive a Td booster every 10 years.  Zoster vaccine. One dose is recommended for adults aged 47 years or older unless certain conditions are present.  Pneumococcal 13-valent conjugate (PCV13) vaccine. When indicated, a person who is uncertain of her immunization history and has no record of immunization should receive the PCV13  vaccine. An adult aged 63 years or older who has certain medical conditions and has not been previously immunized should receive 1 dose of PCV13 vaccine. This PCV13 should be followed with a dose of pneumococcal polysaccharide (PPSV23) vaccine. The PPSV23  vaccine dose should be obtained at least 1 or more year(s) after the dose of PCV13 vaccine. An adult aged 19 years or older who has certain medical conditions and previously received 1 or more doses of PPSV23 vaccine should receive 1 dose of PCV13. The PCV13 vaccine dose should be obtained 1 or more years after the last PPSV23 vaccine dose.  Pneumococcal polysaccharide (PPSV23) vaccine. When PCV13 is also indicated, PCV13 should be obtained first. All adults aged 65 years and older should be immunized. An adult younger than age 65 years who has certain medical conditions should be immunized. Any person who resides in a nursing home or long-term care facility should be immunized. An adult smoker should be immunized. People with an immunocompromised condition and certain other conditions should receive both PCV13 and PPSV23 vaccines. People with human immunodeficiency virus (HIV) infection should be immunized as soon as possible after diagnosis. Immunization during chemotherapy or radiation therapy should be avoided. Routine use of PPSV23 vaccine is not recommended for American Indians, [place] Natives, or people younger than 65 years unless there are medical conditions that require PPSV23 vaccine. When indicated, people who have unknown immunization and have no record of immunization should receive PPSV23 vaccine. One-time revaccination 5 years after the first dose of PPSV23 is recommended for people aged 19-64 years who have chronic kidney failure, nephrotic syndrome, asplenia, or immunocompromised conditions. People who received 1-2 doses of PPSV23 before age 65 years should receive another dose of PPSV23 vaccine at age 65 years or later if at least 5 years have  passed since the previous dose. Doses of PPSV23 are not needed for people immunized with PPSV23 at or after age 65 years.  Preventive Services / Frequency  Ages 65 years and over Blood pressure check. Lipid and cholesterol check. Lung cancer screening. / Every year if you are aged 55-80 years and have a 30-pack-year history of smoking and currently smoke or have quit within the past 15 years. Yearly screening is stopped once you have quit smoking for at least 15 years or develop a health problem that would prevent you from having lung cancer treatment. Clinical breast exam.** / Every year after age 40 years.  BRCA-related cancer risk assessment.** / For women who have family members with a BRCA-related cancer (breast, ovarian, tubal, or peritoneal cancers). Mammogram.** / Every year beginning at age 40 years and continuing for as long as you are in good health. Consult with your health care provider. Pap test.** / Every 3 years starting at age 30 years through age 65 or 70 years with 3 consecutive normal Pap tests. Testing can be stopped between 65 and 70 years with 3 consecutive normal Pap tests and no abnormal Pap or HPV tests in the past 10 years. Fecal occult blood test (FOBT) of stool. / Every year beginning at age 50 years and continuing until age 75 years. You may not need to do this test if you get a colonoscopy every 10 years. Flexible sigmoidoscopy or colonoscopy.** / Every 5 years for a flexible sigmoidoscopy or every 10 years for a colonoscopy beginning at age 50 years and continuing until age 75 years. Hepatitis C blood test.** / For all people born from 1945 through 1965 and any individual with known risks for hepatitis C. Osteoporosis screening.** / A one-time screening for women ages 65 years and over and women at risk for fractures or osteoporosis. Skin self-exam. / Monthly. Influenza vaccine. / Every year. Tetanus, diphtheria, and acellular pertussis (Tdap/Td) vaccine.** /   1 dose  of Td every 10 years. Zoster vaccine.** / 1 dose for adults aged 60 years or older. Pneumococcal 13-valent conjugate (PCV13) vaccine.** / Consult your health care provider. Pneumococcal polysaccharide (PPSV23) vaccine.** / 1 dose for all adults aged 65 years and older. Screening for abdominal aortic aneurysm (AAA)  by ultrasound is recommended for people who have history of high blood pressure or who are current or former smokers. ++++++++++++++++++++ Recommend Adult Low Dose Aspirin or  coated  Aspirin 81 mg daily  To reduce risk of Colon Cancer 40 %,  Skin Cancer 26 % ,  Melanoma 46%  and  Pancreatic cancer 60% ++++++++++++++++++++ Vitamin D goal  is between 70-100.  Please make sure that you are taking your Vitamin D as directed.  It is very important as a natural anti-inflammatory  helping hair, skin, and nails, as well as reducing stroke and heart attack risk.  It helps your bones and helps with mood. It also decreases numerous cancer risks so please take it as directed.  Low Vit D is associated with a 200-300% higher risk for CANCER  and 200-300% higher risk for HEART   ATTACK  &  STROKE.   ...................................... It is also associated with higher death rate at younger ages,  autoimmune diseases like Rheumatoid arthritis, Lupus, Multiple Sclerosis.    Also many other serious conditions, like depression, Alzheimer's Dementia, infertility, muscle aches, fatigue, fibromyalgia - just to name a few. ++++++++++++++++++ Recommend the book "The END of DIETING" by Dr Joel Fuhrman  & the book "The END of DIABETES " by Dr Joel Fuhrman At [employer] - get book & Audio CD's    Being diabetic has a  300% increased risk for heart attack, stroke, cancer, and alzheimer- type vascular dementia. It is very important that you work harder with diet by avoiding all foods that are white. Avoid white rice (brown & wild rice is OK), white potatoes (sweetpotatoes in moderation is OK),  White bread or wheat bread or anything made out of white flour like bagels, donuts, rolls, buns, biscuits, cakes, pastries, cookies, pizza crust, and pasta (made from white flour & egg whites) - vegetarian pasta or spinach or wheat pasta is OK. Multigrain breads like Arnold's or Pepper[employer], or multigrain sandwich thins or flatbreads.  Diet, exercise and weight loss can reverse and cure diabetes in the early stages.  Diet, exercise and weight loss is very important in the control and prevention of complications of diabetes which affects every system in your body, ie. Brain - dementia/stroke, eyes - glaucoma/blindness, heart - heart attack/heart failure, kidneys - dialysis, stomach - gastric paralysis, intestines - malabsorption, nerves - severe painful neuritis, circulation - gangrene & loss of a leg(s), and finally cancer and Alzheimers.    I recommend avoid fried & greasy foods,  sweets/candy, white rice (brown or wild rice or Quinoa is OK), white potatoes (sweet potatoes are OK) - anything made from white flour - bagels, doughnuts, rolls, buns, biscuits,white and wheat breads, pizza crust and traditional pasta made of white flour & egg white(vegetarian pasta or spinach or wheat pasta is OK).  Multi-grain bread is OK - like multi-grain flat bread or sandwich thins. Avoid alcohol in excess. Exercise is also important.    Eat all the vegetables you want - avoid meat, especially red meat and dairy - especially cheese.  Cheese is the most concentrated form of trans-fats which is the worst thing to clog up our arteries. Veggie cheese is OK   which can be found in the fresh produce section at [employer] or [employer] or [employer]  +++++++++++++++++++ DASH Eating Plan  DASH stands for "Dietary Approaches to Stop Hypertension."   The DASH eating plan is a healthy eating plan that has been shown to reduce high blood pressure (hypertension). Additional health benefits may include reducing the risk of type 2  diabetes mellitus, heart disease, and stroke. The DASH eating plan may also help with weight loss. WHAT DO I NEED TO KNOW ABOUT THE DASH EATING PLAN? For the DASH eating plan, you will follow these general guidelines: Choose foods with a percent daily value for sodium of less than 5% (as listed on the food label). Use salt-free seasonings or herbs instead of table salt or sea salt. Check with your health care provider or pharmacist before using salt substitutes. Eat lower-sodium products, often labeled as "lower sodium" or "no salt added." Eat fresh foods. Eat more vegetables, fruits, and low-fat dairy products. Choose whole grains. Look for the word "whole" as the first word in the ingredient list. Choose fish  Limit sweets, desserts, sugars, and sugary drinks. Choose heart-healthy fats. Eat veggie cheese  Eat more home-cooked food and less restaurant, buffet, and fast food. Limit fried foods. Cook foods using methods other than frying. Limit canned vegetables. If you do use them, rinse them well to decrease the sodium. When eating at a restaurant, ask that your food be prepared with less salt, or no salt if possible.                      WHAT FOODS CAN I EAT? Read Dr Joel Fuhrman's books on The End of Dieting & The End of Diabetes  Grains Whole grain or whole wheat bread. Brown rice. Whole grain or whole wheat pasta. Quinoa, bulgur, and whole grain cereals. Low-sodium cereals. Corn or whole wheat flour tortillas. Whole grain cornbread. Whole grain crackers. Low-sodium crackers.  Vegetables Fresh or frozen vegetables (raw, steamed, roasted, or grilled). Low-sodium or reduced-sodium tomato and vegetable juices. Low-sodium or reduced-sodium tomato sauce and paste. Low-sodium or reduced-sodium canned vegetables.   Fruits All fresh, canned (in natural juice), or frozen fruits.  Protein Products  All fish and seafood.  Dried beans, peas, or lentils. Unsalted nuts and seeds. Unsalted  canned beans.  Dairy Low-fat dairy products, such as skim or 1% milk, 2% or reduced-fat cheeses, low-fat ricotta or cottage cheese, or plain low-fat yogurt. Low-sodium or reduced-sodium cheeses.  Fats and Oils Tub margarines without trans fats. Light or reduced-fat mayonnaise and salad dressings (reduced sodium). Avocado. Safflower, olive, or canola oils. Natural peanut or almond butter.  Other Unsalted popcorn and pretzels. The items listed above may not be a complete list of recommended foods or beverages. Contact your dietitian for more options.  +++++++++++++++  WHAT FOODS ARE NOT RECOMMENDED? Grains/ White flour or wheat flour White bread. White pasta. White rice. Refined cornbread. Bagels and croissants. Crackers that contain trans fat.  Vegetables  Creamed or fried vegetables. Vegetables in a . Regular canned vegetables. Regular canned tomato sauce and paste. Regular tomato and vegetable juices.  Fruits Dried fruits. Canned fruit in light or heavy syrup. Fruit juice.  Meat and Other Protein Products Meat in general - RED meat & White meat.  Fatty cuts of meat. Ribs, chicken wings, all processed meats as bacon, sausage, bologna, salami, fatback, hot dogs, bratwurst and packaged luncheon meats.  Dairy Whole or 2% milk, cream, half-and-half, and cream cheese.   Whole-fat or sweetened yogurt. Full-fat cheeses or blue cheese. Non-dairy creamers and whipped toppings. Processed cheese, cheese spreads, or cheese curds.  Condiments Onion and garlic salt, seasoned salt, table salt, and sea salt. Canned and packaged gravies. Worcestershire sauce. Tartar sauce. Barbecue sauce. Teriyaki sauce. Soy sauce, including reduced sodium. Steak sauce. Fish sauce. Oyster sauce. Cocktail sauce. Horseradish. Ketchup and mustard. Meat flavorings and tenderizers. Bouillon cubes. Hot sauce. Tabasco sauce. Marinades. Taco seasonings. Relishes.  Fats and Oils Butter, stick margarine, lard, shortening and  bacon fat. Coconut, palm kernel, or palm oils. Regular salad dressings.  Pickles and olives. Salted popcorn and pretzels.  The items listed above may not be a complete list of foods and beverages to avoid.   

## 2023-01-30 NOTE — Progress Notes (Signed)
^<^<^<^<^<^<^<^<^<^<^<^<^<^<^<^<^<^<^<^<^<^<^<^<^<^<^<^<^<^<^<^<^<^<^<^<^ ^>^>^>^>^>^>^>^>^>^>^>>^>^>^>^>^>^>^>^>^>^>^>^>^>^>^>^>^>^>^>^>^>^>^>^>^>  -  Test results slightly outside the reference range are not unusual. If there is anything important, I will review this with you,  otherwise it is considered normal test values.  If you have further questions,  please do not hesitate to contact me at the office or via My Chart.   ^<^<^<^<^<^<^<^<^<^<^<^<^<^<^<^<^<^<^<^<^<^<^<^<^<^<^<^<^<^<^<^<^<^<^<^<^ ^>^>^>^>^>^>^>^>^>^>^>^>^>^>^>^>^>^>^>^>^>^>^>^>^>^>^>^>^>^>^>^>^>^>^>^>^  -  Chol = 133  &   LDL = 55 - Both Excellent  !                                                                    Please continue Rosuvastatin / Crestor Same  ^<^<^<^<^<^<^<^<^<^<^<^<^<^<^<^<^<^<^<^<^<^<^<^<^<^<^<^<^<^<^<^<^<^<^<^<^ ^>^>^>^>^>^>^>^>^>^>^>^>^>^>^>^>^>^>^>^>^>^>^>^>^>^>^>^>^>^>^>^>^>^>^>^>^  - A1c = 5.7% - 12 week average blood sugar is slightly elevated , So   - Avoid Sweets, Candy & White Stuff   - White Rice, White [place], White Flour  - Breads &  Pasta ^<^<^<^<^<^<^<^<^<^<^<^<^<^<^<^<^<^<^<^<^<^<^<^<^<^<^<^<^<^<^<^<^<^<^<^<^ ^>^>^>^>^>^>^>^>^>^>^>^>^>^>^>^>^>^>^>^>^>^>^>^>^>^>^>^>^>^>^>^>^>^>^>^>^  - Vitamin B12 is Normal - Great  ^<^<^<^<^<^<^<^<^<^<^<^<^<^<^<^<^<^<^<^<^<^<^<^<^<^<^<^<^<^<^<^<^<^<^<^<^ ^>^>^>^>^>^>^>^>^>^>^>^>^>^>^>^>^>^>^>^>^>^>^>^>^>^>^>^>^>^>^>^>^>^>^>^>^  - Vit D = 96 - Excellent - Please continue dose same   ^<^<^<^<^<^<^<^<^<^<^<^<^<^<^<^<^<^<^<^<^<^<^<^<^<^<^<^<^<^<^<^<^<^<^<^<^ ^>^>^>^>^>^>^>^>^>^>^>^>^>^>^>^>^>^>^>^>^>^>^>^>^>^>^>^>^>^>^>^>^>^>^>^>^  - All Else - CBC - Kidneys - Electrolytes - Liver - Magnesium & Thyroid    - all  Normal / OK ^<^<^<^<^<^<^<^<^<^<^<^<^<^<^<^<^<^<^<^<^<^<^<^<^<^<^<^<^<^<^<^<^<^<^<^<^ ^>^>^>^>^>^>^>^>^>^>^>^>^>^>^>^>^>^>^>^>^>^>^>^>^>^>^>^>^>^>^>^>^>^>^>^>^  - Keep up the [place]  Work  !    ^<^<^<^<^<^<^<^<^<^<^<^<^<^<^<^<^<^<^<^<^<^<^<^<^<^<^<^<^<^<^<^<^<^<^<^<^ ^>^>^>^>^>^>^>^>^>^>^>^>^>^>^>^>^>^>^>^>^>^>^>^>^>^>^>^>^>^>^>^>^>^>^>^>^

## 2023-02-10 DIAGNOSIS — Z124 Encounter for screening for malignant neoplasm of cervix: Secondary | ICD-10-CM | POA: Diagnosis not present

## 2023-02-10 DIAGNOSIS — N8110 Cystocele, unspecified: Secondary | ICD-10-CM | POA: Diagnosis not present

## 2023-02-10 DIAGNOSIS — Z01419 Encounter for gynecological examination (general) (routine) without abnormal findings: Secondary | ICD-10-CM | POA: Diagnosis not present

## 2023-04-04 DIAGNOSIS — R29890 Loss of height: Secondary | ICD-10-CM | POA: Diagnosis not present

## 2023-04-04 DIAGNOSIS — M8588 Other specified disorders of bone density and structure, other site: Secondary | ICD-10-CM | POA: Diagnosis not present

## 2023-04-04 DIAGNOSIS — E349 Endocrine disorder, unspecified: Secondary | ICD-10-CM | POA: Diagnosis not present

## 2023-04-07 ENCOUNTER — Other Ambulatory Visit: Payer: Self-pay | Admitting: Internal Medicine

## 2023-04-07 DIAGNOSIS — S161XXD Strain of muscle, fascia and tendon at neck level, subsequent encounter: Secondary | ICD-10-CM

## 2023-04-07 MED ORDER — CYCLOBENZAPRINE HCL 10 MG PO TABS
ORAL_TABLET | ORAL | 0 refills | Status: DC
Start: 2023-04-07 — End: 2023-11-17

## 2023-05-09 NOTE — Progress Notes (Deleted)
FOLLOW UP  Assessment and Plan:   Cholesterol Currently with moderate elevations, does have maternal MI hx working on lifestyle, has reduced animal fats, increasing fiber intake Strong preference to avoid medication if possible, particularly statin Continue Rosuvastatin 10 mg daily Continue low cholesterol diet and exercise.  Check lipid panel.  CMP  Abnormal Glucose Recent A1Cs at goal Discussed diet/exercise, weight management  A1C; check CMP  GERD Well managed on current medications Discussed diet, avoiding triggers and other lifestyle changes  Hypothyroidism Has mild elevation, if remains above 6 will start Levothyroxine low dose Recheck TSH She is persistently symptomatic, consider trial of low dose synthroid if remains borderline  Vitamin D Def At goal at last visit; continue supplementation to maintain goal of 60-100 Defer Vit D level  Medication Management - CBC  Flu vaccine Need Flu Quad 6+ months PF IM given     Continue diet and meds as discussed. Further disposition pending results of labs. Discussed med's effects and SE's.   Over 30 minutes of exam, counseling, chart review, and critical decision making was performed.   Future Appointments  Date Time Provider Department Center  05/10/2023 10:30 AM Raynelle Dick, NP [facility] None  08/11/2023 10:30 AM Lucky Cowboy, MD [facility] None  11/09/2023 10:30 AM Raynelle Dick, NP [facility] None  02/09/2024 10:00 AM Lucky Cowboy, MD [facility] None    ----------------------------------------------------------------------------------------------------------------------  HPI 67 y.o. female  presents for 3 month follow up on cholesterol, elevated TSH, anxiety, GERD, vitamin D deficiency.   She has hx of anxiety treated by sertraline 150 mg daily.   GERD taking omprazole 40 mg in AM, famotidine 20 mg at night and reports no longer having breakthrough sx at night.  She has had some left lower  abdominal pain and diarrhea x 2 days- symptoms are worse when she is stressed. She has had diverticulitis in the past.   BMI is There is no height or weight on file to calculate BMI., she has been working on diet and exercise, very active in yard, watching her young grandson, hits 8000-10000 steps on fitbit daily. Admits some plurging over the holidays.  Wt Readings from Last 3 Encounters:  01/28/23 144 lb 12.8 oz (65.7 kg)  10/28/22 141 lb 3.2 oz (64 kg)  07/19/22 140 lb (63.5 kg)   Her blood pressure has been controlled at home, today their BP is   BP Readings from Last 3 Encounters:  01/28/23 121/72  10/28/22 112/64  08/06/22 123/71     She does not workout but generally active. She denies chest pain, shortness of breath, dizziness.   She is on cholesterol medication, Rosuvastatin 10 mg daily.  Her cholesterol is at goal. She did start fish oil since last visit. The cholesterol last visit was:   Lab Results  Component Value Date   CHOL 133 01/28/2023   HDL 54 01/28/2023   LDLCALC 55 01/28/2023   LDLDIRECT 130.8 01/08/2013   TRIG 164 (H) 01/28/2023   CHOLHDL 2.5 01/28/2023    She has been working on diet and exercise for hx of prediabetes, and denies increased appetite, nausea, paresthesia of the feet, polydipsia, polyuria and visual disturbances.  Last A1C in the office was:  Lab Results  Component Value Date   HGBA1C 5.7 (H) 01/28/2023   TSh was mildly elevated at previous check (6.16 11/2019); she endorses mild fatigue, thinning hair; denies weight gain, dry skin, constipation.  Lab Results  Component Value Date   TSH 2.59 01/28/2023  Patient is on Vitamin D supplement.   Lab Results  Component Value Date   VD25OH 96 01/28/2023     B12 was found to be borderline low, recommended for sublingual supplement Lab Results  Component Value Date   VITAMINB12 703 01/28/2023   She reports hx of intermittent mild iron def, donates blood/plasma frequently, occasionally can't  give due to being too low, taking iron pill twice daily.  Lab Results  Component Value Date   IRON 107 01/01/2022   TIBC 326 01/01/2022   FERRITIN 51 06/25/2020     Current Medications:  Current Outpatient Medications on File Prior to Visit  Medication Sig   albuterol (VENTOLIN HFA) 108 (90 Base) MCG/ACT inhaler Inhale 2 puffs into the lungs every 6 (six) hours as needed for wheezing or shortness of breath.   cyclobenzaprine (FLEXERIL) 10 MG tablet Take  1/2 to 1 tablet  3 x /day  as needed for Muscle Spasm   famotidine (PEPCID) 20 MG tablet Take 20 mg by mouth daily. Prior to dinner. Gets OTC   ibuprofen (ADVIL) 200 MG tablet Take 200 mg by mouth every 6 (six) hours as needed.   levothyroxine (SYNTHROID) 25 MCG tablet TAKE 1 TABLET(25 MCG) BY MOUTH DAILY BEFORE BREAKFAST   omeprazole (PRILOSEC) 40 MG capsule TAKE 1 CAPSULE BY MOUTH DAILY BEFORE BREAKFAST   Probiotic Product (PROBIOTIC PO) Take by mouth.   rosuvastatin (CRESTOR) 10 MG tablet TAKE 1 TABLET BY MOUTH DAILY FOR CHOLESTEROL   sertraline (ZOLOFT) 100 MG tablet Take 1 & 1/2 tablet (150 mg)  Daily  for Mood                                             /                                   TAKE                       BY                    MOUTH   Zinc 50 MG CAPS Take by mouth daily. Couple days a week   No current facility-administered medications on file prior to visit.     Allergies:  Allergies  Allergen Reactions   Amoxil [Amoxicillin] Itching   Penicillins Rash    ? Rash as child     Medical History:  Past Medical History:  Diagnosis Date   Anxiety    Diverticulitis 2016   Fracture of fifth metacarpal bone of left hand 09/03/2011   post fall   GERD (gastroesophageal reflux disease)    Osteoarthritis    Family history- Reviewed and unchanged Social history- Reviewed and unchanged    Review of Systems:  Review of Systems  Constitutional:  Negative for malaise/fatigue and weight loss.  HENT:  Negative for  hearing loss and tinnitus.   Eyes:  Negative for blurred vision and double vision.  Respiratory:  Negative for cough, shortness of breath and wheezing.   Cardiovascular:  Negative for chest pain, palpitations, orthopnea, claudication and leg swelling.  Gastrointestinal:  Positive for abdominal pain (LLQ) and diarrhea (Intermittent worse with stress). Negative for blood in stool, constipation, heartburn, melena, nausea and vomiting.  Genitourinary: Negative.  Musculoskeletal:  Negative for joint pain and myalgias.  Skin:  Negative for rash.  Neurological:  Negative for dizziness, tingling, sensory change, weakness and headaches.  Endo/Heme/Allergies:  Negative for polydipsia.  Psychiatric/Behavioral:  The patient is nervous/anxious.   All other systems reviewed and are negative.   Physical Exam: There were no vitals taken for this visit. Wt Readings from Last 3 Encounters:  01/28/23 144 lb 12.8 oz (65.7 kg)  10/28/22 141 lb 3.2 oz (64 kg)  07/19/22 140 lb (63.5 kg)   General Appearance: Well nourished, in no apparent distress. Eyes: PERRLA, EOMs, conjunctiva no swelling or erythema Sinuses: No Frontal/maxillary tenderness ENT/Mouth: Ext aud canals clear, TMs without erythema, bulging. No erythema, swelling, or exudate on post pharynx.  Tonsils not swollen or erythematous. Hearing normal.  Neck: Supple, thyroid normal.  Respiratory: Respiratory effort normal, BS equal bilaterally without rales, rhonchi, wheezing or stridor.  Cardio: RRR with no MRGs. Brisk peripheral pulses without edema.  Abdomen: Soft, + BS.  Mild tenderness LLQ no masses felt Lymphatics: Non tender without lymphadenopathy.  Musculoskeletal: Full ROM, 5/5 strength, Normal gait Skin: Warm, dry without rashes, lesions, ecchymosis.  Neuro: Cranial nerves intact. No cerebellar symptoms.  Psych: Awake and oriented X 3, normal affect, Insight and Judgment appropriate.    Raynelle Dick, NP 12:06 PM [facility]

## 2023-05-10 ENCOUNTER — Ambulatory Visit: Payer: Medicare HMO | Admitting: Nurse Practitioner

## 2023-05-11 NOTE — Progress Notes (Unsigned)
FOLLOW UP  Assessment and Plan:   Cholesterol Currently with moderate elevations, does have maternal MI hx working on lifestyle, has reduced animal fats, increasing fiber intake Strong preference to avoid medication if possible, particularly statin Continue Rosuvastatin 10 mg daily Continue low cholesterol diet and exercise.  Check lipid panel.  CMP  Abnormal Glucose Recent A1Cs at goal Discussed diet/exercise, weight management  A1C; check CMP  GERD Well managed on current medications- omeprazole 40 mg in am and famotidine 20 mg at night Discussed diet, avoiding triggers and other lifestyle changes  Anxiety Continue Zoloft 100 mg 1 and 1/2 tab daily Start Buspar 10 mg 1/2-1 tab BID Practice good sleep hygiene, diet and exercise  Hypothyroidism Has mild elevation, if remains above 6 will start Levothyroxine low dose Recheck TSH She is persistently symptomatic, consider trial of low dose synthroid if remains borderline  Vitamin D Def At goal at last visit; continue supplementation to maintain goal of 60-100 Defer Vit D level  Medication management -     CBC with Differential/Platelet -     COMPLETE METABOLIC PANEL WITH GFR -     Lipid panel -     TSH  Traumatic partial tear of right biceps tendon, initial encounter Given rehab exercises Use Meloxicam 7.5 mg every other day x 10 days- if no improvement notify the office -     meloxicam (MOBIC) 7.5 MG tablet; Take 1 tablet every other day for 10 days         Continue diet and meds as discussed. Further disposition pending results of labs. Discussed med's effects and SE's.   Over 30 minutes of exam, counseling, chart review, and critical decision making was performed.   Future Appointments  Date Time Provider Department Center  08/11/2023 10:30 AM Lucky Cowboy, MD [facility] None  11/09/2023 10:30 AM Raynelle Dick, NP [facility] None  02/09/2024 10:00 AM Lucky Cowboy, MD [facility] None     ----------------------------------------------------------------------------------------------------------------------  HPI 67 y.o. female  presents for 3 month follow up on cholesterol, elevated TSH, anxiety, GERD, vitamin D deficiency.   She was lifting a 81 year old child she watches into their car seat and developed pain in her right shoulder and has been ongoing x 2 months.   She has hx of anxiety treated by sertraline 150 mg daily. Has noticed her anxiety has been worse.She feels stressed and anxious and will feel pressure in her chest. She does have additional social stress at home currently  GERD taking omprazole 40 mg in AM, famotidine 20 mg at night and reports no longer having breakthrough sx at night.    BMI is Body mass index is 22.65 kg/m., she has been working on diet and exercise, very active in yard, watching her young grandson, hits 8000-10000 steps on fitbit daily. Admits some plurging over the holidays.  Wt Readings from Last 3 Encounters:  05/12/23 144 lb 9.6 oz (65.6 kg)  01/28/23 144 lb 12.8 oz (65.7 kg)  10/28/22 141 lb 3.2 oz (64 kg)   Her blood pressure has been controlled at home without medication, today their BP is BP: 130/78 BP Readings from Last 3 Encounters:  05/12/23 130/78  01/28/23 121/72  10/28/22 112/64  She does not workout but generally active. She denies chest pain, shortness of breath, dizziness.   She is on cholesterol medication, Rosuvastatin 10 mg daily.  Her cholesterol is at goal. She did start fish oil since last visit. The cholesterol last visit was:   Lab  Results  Component Value Date   CHOL 133 01/28/2023   HDL 54 01/28/2023   LDLCALC 55 01/28/2023   LDLDIRECT 130.8 01/08/2013   TRIG 164 (H) 01/28/2023   CHOLHDL 2.5 01/28/2023    She has been working on diet and exercise for hx of prediabetes, and denies increased appetite, nausea, paresthesia of the feet, polydipsia, polyuria and visual disturbances.  Last A1C in the office  was:  Lab Results  Component Value Date   HGBA1C 5.7 (H) 01/28/2023   Continues on Levothyroxine 25 mcg every day  Lab Results  Component Value Date   TSH 2.59 01/28/2023    Patient is on Vitamin D supplement.   Lab Results  Component Value Date   VD25OH 96 01/28/2023     B12 was found to be borderline low, recommended for sublingual supplement Lab Results  Component Value Date   VITAMINB12 703 01/28/2023   She reports hx of intermittent mild iron def, donates blood/plasma frequently, occasionally can't give due to being too low, taking iron pill twice daily.  Lab Results  Component Value Date   IRON 107 01/01/2022   TIBC 326 01/01/2022   FERRITIN 51 06/25/2020     Current Medications:  Current Outpatient Medications on File Prior to Visit  Medication Sig   albuterol (VENTOLIN HFA) 108 (90 Base) MCG/ACT inhaler Inhale 2 puffs into the lungs every 6 (six) hours as needed for wheezing or shortness of breath.   Cholecalciferol (VITAMIN D-3) 125 MCG (5000 UT) TABS Take by mouth.   cyclobenzaprine (FLEXERIL) 10 MG tablet Take  1/2 to 1 tablet  3 x /day  as needed for Muscle Spasm   famotidine (PEPCID) 20 MG tablet Take 20 mg by mouth daily. Prior to dinner. Gets OTC   ibuprofen (ADVIL) 200 MG tablet Take 200 mg by mouth every 6 (six) hours as needed.   levothyroxine (SYNTHROID) 25 MCG tablet TAKE 1 TABLET(25 MCG) BY MOUTH DAILY BEFORE BREAKFAST   omeprazole (PRILOSEC) 40 MG capsule TAKE 1 CAPSULE BY MOUTH DAILY BEFORE BREAKFAST   rosuvastatin (CRESTOR) 10 MG tablet TAKE 1 TABLET BY MOUTH DAILY FOR CHOLESTEROL   sertraline (ZOLOFT) 100 MG tablet Take 1 & 1/2 tablet (150 mg)  Daily  for Mood                                             /                                   TAKE                       BY                    MOUTH   sucralfate (CARAFATE) 1 g tablet    Zinc 50 MG CAPS Take by mouth daily. Couple days a week   Probiotic Product (PROBIOTIC PO) Take by mouth. (Patient not  taking: Reported on 05/12/2023)   No current facility-administered medications on file prior to visit.     Allergies:  Allergies  Allergen Reactions   Amoxil [Amoxicillin] Itching   Penicillins Rash    ? Rash as child     Medical History:  Past Medical History:  Diagnosis Date   Anxiety  Diverticulitis 2016   Fracture of fifth metacarpal bone of left hand 09/03/2011   post fall   GERD (gastroesophageal reflux disease)    Osteoarthritis    Family history- Reviewed and unchanged Social history- Reviewed and unchanged    Review of Systems:  Review of Systems  Constitutional:  Negative for malaise/fatigue and weight loss.  HENT:  Negative for hearing loss and tinnitus.   Eyes:  Negative for blurred vision and double vision.  Respiratory:  Negative for cough, shortness of breath and wheezing.   Cardiovascular:  Negative for chest pain, palpitations, orthopnea, claudication and leg swelling.  Gastrointestinal:  Negative for abdominal pain, blood in stool, constipation, diarrhea, heartburn, melena, nausea and vomiting.  Genitourinary: Negative.   Musculoskeletal:  Positive for joint pain (right shoulder). Negative for myalgias.  Skin:  Negative for rash.  Neurological:  Negative for dizziness, tingling, sensory change, weakness and headaches.  Endo/Heme/Allergies:  Negative for polydipsia.  Psychiatric/Behavioral:  The patient is nervous/anxious.   All other systems reviewed and are negative.   Physical Exam: BP 130/78   Pulse 84   Temp 97.7 F (36.5 C)   Ht 5\' 7"  (1.702 m)   Wt 144 lb 9.6 oz (65.6 kg)   SpO2 97%   BMI 22.65 kg/m  Wt Readings from Last 3 Encounters:  05/12/23 144 lb 9.6 oz (65.6 kg)  01/28/23 144 lb 12.8 oz (65.7 kg)  10/28/22 141 lb 3.2 oz (64 kg)   General Appearance: Well nourished, in no apparent distress. Eyes: PERRLA, EOMs, conjunctiva no swelling or erythema Sinuses: No Frontal/maxillary tenderness ENT/Mouth: Ext aud canals clear, TMs  without erythema, bulging. No erythema, swelling, or exudate on post pharynx.  Hearing normal.  Neck: Supple, thyroid normal.  Respiratory: Respiratory effort normal, BS equal bilaterally without rales, rhonchi, wheezing or stridor.  Cardio: RRR with no MRGs. Brisk peripheral pulses without edema.  Abdomen: Soft, + BS.  Mild tenderness LLQ no masses felt Lymphatics: Non tender without lymphadenopathy.  Musculoskeletal: Full ROM, 5/5 strength, Normal gait. Slightly limited ROM of right shoulder due to pain- tenderness 2-3 inches below right shoulder, no mass noted Skin: Warm, dry without rashes, lesions, ecchymosis.  Neuro: Cranial nerves intact. No cerebellar symptoms.  Psych: Awake and oriented X 3, normal affect, Insight and Judgment appropriate.    Raynelle Dick, NP 10:53 AM [facility]

## 2023-05-12 ENCOUNTER — Encounter: Payer: Self-pay | Admitting: Nurse Practitioner

## 2023-05-12 ENCOUNTER — Ambulatory Visit (INDEPENDENT_AMBULATORY_CARE_PROVIDER_SITE_OTHER): Payer: Medicare HMO | Admitting: Nurse Practitioner

## 2023-05-12 VITALS — BP 130/78 | HR 84 | Temp 97.7°F | Ht 67.0 in | Wt 144.6 lb

## 2023-05-12 DIAGNOSIS — E782 Mixed hyperlipidemia: Secondary | ICD-10-CM | POA: Diagnosis not present

## 2023-05-12 DIAGNOSIS — S46211A Strain of muscle, fascia and tendon of other parts of biceps, right arm, initial encounter: Secondary | ICD-10-CM

## 2023-05-12 DIAGNOSIS — F419 Anxiety disorder, unspecified: Secondary | ICD-10-CM | POA: Diagnosis not present

## 2023-05-12 DIAGNOSIS — E039 Hypothyroidism, unspecified: Secondary | ICD-10-CM | POA: Diagnosis not present

## 2023-05-12 DIAGNOSIS — R7309 Other abnormal glucose: Secondary | ICD-10-CM

## 2023-05-12 DIAGNOSIS — K2100 Gastro-esophageal reflux disease with esophagitis, without bleeding: Secondary | ICD-10-CM | POA: Diagnosis not present

## 2023-05-12 DIAGNOSIS — Z79899 Other long term (current) drug therapy: Secondary | ICD-10-CM

## 2023-05-12 DIAGNOSIS — E559 Vitamin D deficiency, unspecified: Secondary | ICD-10-CM

## 2023-05-12 MED ORDER — BUSPIRONE HCL 10 MG PO TABS: 10.0000 mg | ORAL_TABLET | Freq: Two times a day (BID) | ORAL | 2 refills | Status: AC

## 2023-05-12 MED ORDER — MELOXICAM 7.5 MG PO TABS: ORAL_TABLET | ORAL | 0 refills | Status: DC

## 2023-05-12 NOTE — Patient Instructions (Addendum)
Buspar 10 mg 1/2-1 tab twice a day for anxiety  Meloxicam 7.5 mg every other day for 10 days Distal Biceps Tendinitis Rehab Ask your health care provider which exercises are safe for you. Do exercises exactly as told by your health care provider and adjust them as directed. It is normal to feel mild stretching, pulling, tightness, or discomfort as you do these exercises. Stop right away if you feel sudden pain or your pain gets worse. Do not begin these exercises until told by your health care provider. Stretching and range-of-motion exercises These exercises warm up your muscles and joints. They can help improve the movement and flexibility of your arm. They may also help to relieve pain and stiffness. Elbow range of motion  Stand or sit with your left / right elbow bent in a 90-degree angle (right angle). Position your forearm so that the thumb is facing the ceiling (neutral position). Slowly straighten your elbow until you feel a stretch. Hold this position for _10_________ seconds. Slowly bend your elbow until you feel a stretch, or until you touch your thumb to your shoulder. Hold this position for ___10_______ seconds. Repeat ___5_______ times. Complete this exercise _____2_____ times a day. Forearm rotation, supination  Stand up or sit with your left / right elbow bent in a 90-degree angle (right angle). Rotate your palm up until you cannot rotate it anymore (supination). Then, use your other hand to help turn your left / right forearm more. Hold this position for _____10_____ seconds. Slowly return to the starting position. Repeat ____5______ times. Complete this exercise _______2___ times a day. Forearm rotation, pronation  Stand up or sit with your left / right elbow bent in a 90-degree angle (right angle). Turn (rotate) your left / right palm down (pronation) until you cannot rotate it anymore. Then, use your other hand to help turn your left / right forearm more. Hold this  position for __10________ seconds. Slowly return to the starting position. Repeat ___5_______ times. Complete this exercise _____2_____ times a day. Biceps stretch  Stand by a door frame. Place your left / right hand on the door frame. Keep your elbow straight during the exercise. Gently turn your body toward the opposite side until you feel a gentle stretch in the front of the elbow or upper arm. Hold this position for ___10_______ seconds. Slowly return to the starting position. Repeat __5________ times. Complete this exercise _______2___ times a day. Strengthening exercises These exercises build strength and endurance in your arm and shoulder. Endurance is the ability to use your muscles for a long time, even after they get tired. Forearm rotation, supination  Sit with your left / right forearm supported on a table. Your elbow should be at waist height. Gently grasp a lightweight hammer near the head. As this exercise gets easier for you, try holding the hammer farther down the handle. Rest your hand over the edge of the table, palm down. Without moving your left / right elbow, slowly rotate your palm up (supination), stopping when your thumb is pointed to the opposite direction. Hold this position for_____10_____ seconds. Slowly return to the starting position. Repeat _____5_____ times. Complete this exercise ______2____ times a day. Forearm rotation, pronation  Sit with your left / right forearm supported on a table. Your elbow should be at waist height. Gently grasp a lightweight hammer near the head. As this exercise gets easier for you, try holding the hammer farther down the handle. Rest your hand over the edge of the  table, palm up. Without moving your left / right elbow, slowly rotate your palm down (pronation), stopping when your thumb is pointed to the opposite direction. Hold this position for ___10_______ seconds. Slowly return to the starting position. Repeat _____5_____  times. Complete this exercise _____2_____ times a day. Biceps curls  Sit on a chair without armrests, or stand up. If directed, hold a ____2-3______ lb / kg weight in your left / right hand, or hold an exercise band with both hands. Your palms should face up toward the ceiling at the starting position. Bend your left / right elbow. Move your hand up toward your shoulder. Keep your other arm straight down, in the starting position. Slowly return to the starting position. Repeat __10________ times. Complete this exercise ______2____ times a day. Elbow extension, supine  Lie on your back (supine position). Hold a____2-3______ lb / kg weight in your left / right hand. Bend your left / right elbow to a 90-degree angle (right angle) so the weight is in front of your face, over your chest. Your elbow should be pointing up to the ceiling. Straighten your elbow, raising your hand toward the ceiling (extension). Use your other hand to support your left / right upper arm and to keep it still. Slowly return to the starting position. Repeat ____10______ times. Complete this exercise _____2_____ times a day. This information is not intended to replace advice given to you by your health care provider. Make sure you discuss any questions you have with your health care provider. Document Revised: 10/16/2021 Document Reviewed: 10/16/2021 Elsevier Patient Education  2024 [employer].

## 2023-05-13 LAB — CBC WITH DIFFERENTIAL/PLATELET
Absolute Lymphocytes: 1409 {cells}/uL (ref 850–3900)
Absolute Monocytes: 279 {cells}/uL (ref 200–950)
Basophils Absolute: 0 {cells}/uL (ref 0–200)
Basophils Relative: 0 %
Eosinophils Absolute: 50 {cells}/uL (ref 15–500)
Eosinophils Relative: 1.1 %
HCT: 36.8 % (ref 35.0–45.0)
Hemoglobin: 12.3 g/dL (ref 11.7–15.5)
MCH: 29.7 pg (ref 27.0–33.0)
MCHC: 33.4 g/dL (ref 32.0–36.0)
MCV: 88.9 fL (ref 80.0–100.0)
MPV: 12.2 fL (ref 7.5–12.5)
Monocytes Relative: 6.2 %
Neutro Abs: 2763 {cells}/uL (ref 1500–7800)
Neutrophils Relative %: 61.4 %
Platelets: 222 10*3/uL (ref 140–400)
RBC: 4.14 10*6/uL (ref 3.80–5.10)
RDW: 12.4 % (ref 11.0–15.0)
Total Lymphocyte: 31.3 %
WBC: 4.5 10*3/uL (ref 3.8–10.8)

## 2023-05-13 LAB — COMPLETE METABOLIC PANEL WITH GFR
AG Ratio: 1.9 (calc) (ref 1.0–2.5)
ALT: 11 U/L (ref 6–29)
AST: 20 U/L (ref 10–35)
Albumin: 4.5 g/dL (ref 3.6–5.1)
Alkaline phosphatase (APISO): 77 U/L (ref 37–153)
BUN: 14 mg/dL (ref 7–25)
CO2: 30 mmol/L (ref 20–32)
Calcium: 9.7 mg/dL (ref 8.6–10.4)
Chloride: 104 mmol/L (ref 98–110)
Creat: 0.78 mg/dL (ref 0.50–1.05)
Globulin: 2.4 g/dL (ref 1.9–3.7)
Glucose, Bld: 89 mg/dL (ref 65–99)
Potassium: 4.7 mmol/L (ref 3.5–5.3)
Sodium: 139 mmol/L (ref 135–146)
Total Bilirubin: 0.5 mg/dL (ref 0.2–1.2)
Total Protein: 6.9 g/dL (ref 6.1–8.1)
eGFR: 84 mL/min/{1.73_m2} (ref >=60)

## 2023-05-13 LAB — LIPID PANEL
Cholesterol: 144 mg/dL (ref <200)
HDL: 50 mg/dL (ref >=50)
LDL Cholesterol (Calc): 67 mg/dL
Non-HDL Cholesterol (Calc): 94 mg/dL (ref <130)
Total CHOL/HDL Ratio: 2.9 (calc) (ref <5.0)
Triglycerides: 196 mg/dL — ABNORMAL HIGH (ref <150)

## 2023-05-13 LAB — TSH: TSH: 3.14 m[IU]/L (ref 0.40–4.50)

## 2023-05-30 ENCOUNTER — Ambulatory Visit (INDEPENDENT_AMBULATORY_CARE_PROVIDER_SITE_OTHER): Payer: Medicare HMO | Admitting: Nurse Practitioner

## 2023-05-30 ENCOUNTER — Other Ambulatory Visit: Payer: Self-pay

## 2023-05-30 ENCOUNTER — Encounter: Payer: Self-pay | Admitting: Nurse Practitioner

## 2023-05-30 VITALS — BP 128/80 | HR 91 | Temp 98.2°F | Ht 67.0 in | Wt 144.4 lb

## 2023-05-30 DIAGNOSIS — R051 Acute cough: Secondary | ICD-10-CM

## 2023-05-30 DIAGNOSIS — J00 Acute nasopharyngitis [common cold]: Secondary | ICD-10-CM | POA: Diagnosis not present

## 2023-05-30 DIAGNOSIS — Z1152 Encounter for screening for COVID-19: Secondary | ICD-10-CM | POA: Diagnosis not present

## 2023-05-30 LAB — POC COVID19 BINAXNOW: SARS Coronavirus 2 Ag: NEGATIVE

## 2023-05-30 MED ORDER — PREDNISONE 10 MG PO TABS: ORAL_TABLET | ORAL | 0 refills | Status: DC

## 2023-05-30 MED ORDER — PROMETHAZINE-DM 6.25-15 MG/5ML PO SYRP: 5.0000 mL | ORAL_SOLUTION | Freq: Four times a day (QID) | ORAL | 0 refills | Status: DC | PRN

## 2023-05-30 MED ORDER — AZITHROMYCIN 250 MG PO TABS: ORAL_TABLET | ORAL | 1 refills | Status: DC

## 2023-05-30 NOTE — Progress Notes (Signed)
Assessment and Plan:  Kathryn [name] was seen today for an episodic visit.  Diagnoses and all order for this visit:  Encounter for screening for COVID-19 Negative   - POC COVID-19  Acute nasopharyngitis Start tmt with Z-Pak Continue Sudafed, Mucinex Stay well hydrated to keep mucus thin and productive  - azithromycin (ZITHROMAX) 250 MG tablet; Take 2 tablets on  Day 1,  followed by 1 tablet  daily for 4 more days    for Sinusitis  /Bronchitis  Dispense: 6 each; Refill: 1 - predniSONE (DELTASONE) 10 MG tablet; 1 tab 3 x day for 2 days, then 1 tab 2 x day for 2 days, then 1 tab 1 x day for 3 days  Dispense: 13 tablet; Refill: 0  Acute cough Continue Promethazine-DM   - promethazine-dextromethorphan (PROMETHAZINE-DM) 6.25-15 MG/5ML syrup; Take 5 mLs by mouth 4 (four) times daily as needed for cough.  Dispense: 240 mL; Refill: 0  Notify office for further evaluation and treatment, questions or concerns if s/s fail to improve. The risks and benefits of my recommendations, as well as other treatment options were discussed with the patient today. Questions were answered.  Further disposition pending results of labs. Discussed med's effects and SE's.    Over 20 minutes of exam, counseling, chart review, and critical decision making was performed.   Future Appointments  Date Time Provider Department Center  08/16/2023 10:30 AM Lucky Cowboy, MD [facility] None  11/17/2023 10:30 AM Raynelle Dick, NP [facility] None  02/24/2024 11:00 AM Lucky Cowboy, MD [facility] None    ------------------------------------------------------------------------------------------------------------------   HPI BP 128/80   Pulse 91   Temp 98.2 F (36.8 C)   Ht 5\' 7"  (1.702 m)   Wt 144 lb 6.4 oz (65.5 kg)   SpO2 98%   BMI 22.62 kg/m    Patient complains of symptoms of a URI, possible sinusitis. Symptoms include congestion, cough described as productive, facial pain, headache described as  achy, low grade fever, nasal congestion, post nasal drip, purulent nasal discharge, sinus pressure, sneezing, sore throat, and tooth pain. Onset of symptoms was 1 week ago, and has been unchanged since that time. Treatment to date: decongestants.  Both grandchildren to whom she visited with over the Thanksgiving holiday have been sick with similar symptoms.  Tested for Covid, Flu and Strep but they were negative.    Past Medical History:  Diagnosis Date   Anxiety    Diverticulitis 2016   Fracture of fifth metacarpal bone of left hand 09/03/2011   post fall   GERD (gastroesophageal reflux disease)    Osteoarthritis      Allergies  Allergen Reactions   Amoxil [Amoxicillin] Itching   Penicillins Rash    ? Rash as child    Current Outpatient Medications on File Prior to Visit  Medication Sig   albuterol (VENTOLIN HFA) 108 (90 Base) MCG/ACT inhaler Inhale 2 puffs into the lungs every 6 (six) hours as needed for wheezing or shortness of breath.   busPIRone (BUSPAR) 10 MG tablet Take 1 tablet (10 mg total) by mouth 2 (two) times daily.   Cholecalciferol (VITAMIN D-3) 125 MCG (5000 UT) TABS Take by mouth.   cyclobenzaprine (FLEXERIL) 10 MG tablet Take  1/2 to 1 tablet  3 x /day  as needed for Muscle Spasm   famotidine (PEPCID) 20 MG tablet Take 20 mg by mouth daily. Prior to dinner. Gets OTC   levothyroxine (SYNTHROID) 25 MCG tablet TAKE 1 TABLET(25 MCG) BY MOUTH DAILY BEFORE BREAKFAST  meloxicam (MOBIC) 7.5 MG tablet Take 1 tablet every other day for 10 days   omeprazole (PRILOSEC) 40 MG capsule TAKE 1 CAPSULE BY MOUTH DAILY BEFORE BREAKFAST   Probiotic Product (PROBIOTIC PO) Take by mouth.   rosuvastatin (CRESTOR) 10 MG tablet TAKE 1 TABLET BY MOUTH DAILY FOR CHOLESTEROL   sertraline (ZOLOFT) 100 MG tablet Take 1 & 1/2 tablet (150 mg)  Daily  for Mood                                             /                                   TAKE                       BY                    MOUTH    sucralfate (CARAFATE) 1 g tablet    Zinc 50 MG CAPS Take by mouth daily. Couple days a week   No current facility-administered medications on file prior to visit.    ROS: all negative except what is noted in the HPI.   Physical Exam:  BP 128/80   Pulse 91   Temp 98.2 F (36.8 C)   Ht 5\' 7"  (1.702 m)   Wt 144 lb 6.4 oz (65.5 kg)   SpO2 98%   BMI 22.62 kg/m   General Appearance: NAD.  Awake, conversant and cooperative. Eyes: PERRLA, EOMs intact.  Sclera white.  Conjunctiva without erythema. Sinuses: Tender frontal/maxillary tenderness.  No nasal discharge. Nares patent.  ENT/Mouth: Ext aud canals clear.  Bilateral TMs w/DOL and without erythema or bulging. Hearing intact.  Posterior pharynx without swelling or exudate.  Tonsils without swelling or erythema.  Neck: Supple.  No masses, nodules or thyromegaly. Respiratory: Effort is regular with non-labored breathing. Breath sounds are equal bilaterally without rales, rhonchi, wheezing or stridor.  Cardio: RRR with no MRGs. Brisk peripheral pulses without edema.  Abdomen: Active BS in all four quadrants.  Soft and non-tender without guarding, rebound tenderness, hernias or masses. Lymphatics: Non tender without lymphadenopathy.  Musculoskeletal: Full ROM, 5/5 strength, normal ambulation.  No clubbing or cyanosis. Skin: Appropriate color for ethnicity. Warm without rashes, lesions, ecchymosis, ulcers.  Neuro: CN II-XII grossly normal. Normal muscle tone without cerebellar symptoms and intact sensation.   Psych: AO X 3,  appropriate mood and affect, insight and judgment.     Adela Glimpse, NP 10:55 AM [facility]

## 2023-05-30 NOTE — Patient Instructions (Signed)

## 2023-06-03 ENCOUNTER — Other Ambulatory Visit: Payer: Self-pay | Admitting: Nurse Practitioner

## 2023-06-03 DIAGNOSIS — E039 Hypothyroidism, unspecified: Secondary | ICD-10-CM

## 2023-08-10 ENCOUNTER — Other Ambulatory Visit: Payer: Self-pay

## 2023-08-10 DIAGNOSIS — K2100 Gastro-esophageal reflux disease with esophagitis, without bleeding: Secondary | ICD-10-CM

## 2023-08-10 MED ORDER — OMEPRAZOLE 40 MG PO CPDR: DELAYED_RELEASE_CAPSULE | ORAL | 0 refills | Status: DC

## 2023-08-11 ENCOUNTER — Ambulatory Visit: Payer: Medicare Other | Admitting: Internal Medicine

## 2023-08-16 ENCOUNTER — Ambulatory Visit: Payer: Medicare Other | Admitting: Internal Medicine

## 2023-10-04 ENCOUNTER — Ambulatory Visit: Payer: Self-pay

## 2023-10-04 DIAGNOSIS — F419 Anxiety disorder, unspecified: Secondary | ICD-10-CM

## 2023-10-04 MED ORDER — SERTRALINE HCL 100 MG PO TABS: ORAL_TABLET | ORAL | 3 refills | Status: DC

## 2023-10-04 NOTE — Telephone Encounter (Signed)
 Please look at refill request for former Oneta Rack pt not establishing with [facility] until May, please advise if ok to refill

## 2023-10-04 NOTE — Telephone Encounter (Signed)
 Pt reporting that she was pt of Dr. Oneta Rack (unexpectedly deceased) and that she was told by [facility] that refills would be taken care of in the interim while establishing care with new provider. Pt reporting that she had pharmacy send refill request but that pharmacy has not heard back. Pt requesting refill of sertraline (Zoloft) 100 mg tablets, taking 1.5 tab daily for mood, be sent to [facility] on [address] in [place]. Pt confirms she has been taking this med long-term and doing well on it. Pt reporting she has enough to get through the end of this week but will refill shortly. Please advise, please call pt back to inform her whether or not this refill can be sent.  Reason for Disposition  [1] Prescription refill request for ESSENTIAL medicine (i.e., likelihood of harm to patient if not taken) AND [2] triager unable to refill per department policy  Answer Assessment - Initial Assessment Questions 1. DRUG NAME: "What medicine do you need to have refilled?"     Sertraline 2. REFILLS REMAINING: "How many refills are remaining?" (Note: The label on the medicine or pill bottle will show how many refills are remaining. If there are no refills remaining, then a renewal may be needed.)     None 4. PRESCRIBING HCP: "Who prescribed it?" Reason: If prescribed by specialist, call should be referred to that group.     Dr. Oneta Rack  Protocols used: Medication Refill and Renewal Call-A-AH

## 2023-10-07 ENCOUNTER — Other Ambulatory Visit: Payer: Self-pay | Admitting: Physician Assistant

## 2023-10-07 DIAGNOSIS — F419 Anxiety disorder, unspecified: Secondary | ICD-10-CM

## 2023-10-07 NOTE — Telephone Encounter (Unsigned)
 Copied from CRM [id number]. Topic: Clinical - Medication Refill >> Oct 07, 2023  9:59 AM Lennart Pall wrote: Most Recent Primary Care Visit:   Medication: sertraline (ZOLOFT) 100 MG tablet  Has the patient contacted their pharmacy? Yes (Agent: If no, request that the patient contact the pharmacy for the refill. If patient does not wish to contact the pharmacy document the reason why and proceed with request.) (Agent: If yes, when and what did the pharmacy advise?)  Is this the correct pharmacy for this prescription? Yes If no, delete pharmacy and type the correct one.  This is the patient's preferred pharmacy:  [facility] [address] [place], [place] - [address] AT [facility] [address] [place] [address] Phone: [phone] Fax: [phone]    Has the prescription been filled recently? Yes  Is the patient out of the medication? Yes  Has the patient been seen for an appointment in the last year OR does the patient have an upcoming appointment? Yes  Can we respond through MyChart? Yes  Agent: Please be advised that Rx refills may take up to 3 business days. We ask that you follow-up with your pharmacy.

## 2023-10-10 ENCOUNTER — Other Ambulatory Visit: Payer: Self-pay | Admitting: Physician Assistant

## 2023-10-10 DIAGNOSIS — F419 Anxiety disorder, unspecified: Secondary | ICD-10-CM

## 2023-10-11 ENCOUNTER — Other Ambulatory Visit: Payer: Self-pay | Admitting: Family

## 2023-10-11 DIAGNOSIS — F419 Anxiety disorder, unspecified: Secondary | ICD-10-CM

## 2023-10-11 MED ORDER — SERTRALINE HCL 100 MG PO TABS
ORAL_TABLET | ORAL | 0 refills | Status: DC
Start: 2023-10-11 — End: 2023-10-12

## 2023-10-11 NOTE — Telephone Encounter (Unsigned)
 Copied from CRM [id number]. Topic: Clinical - Medication Refill >> Oct 11, 2023  3:21 PM Shereese L wrote: Most Recent Primary Care Visit:   Medication: sertraline (ZOLOFT) 100 MG tablet  Has the patient contacted their pharmacy? Yes (Agent: If no, request that the patient contact the pharmacy for the refill. If patient does not wish to contact the pharmacy document the reason why and proceed with request.) (Agent: If yes, when and what did the pharmacy advise?)  Is this the correct pharmacy for this prescription? Yes If no, delete pharmacy and type the correct one.  This is the patient's preferred pharmacy:  [id number] [place] Nestle, [place] - [address] AT [facility] [address] [place] [address] Phone: [phone] Fax: [phone]  [id number] [place], [place] - [address] AT [facility] [address] [place] [address] Phone: [phone] Fax: [phone]  [facility] - [place], [place] - [address] [place] [address] Phone: [phone] Fax: [phone]   Has the prescription been filled recently? Yes  Is the patient out of the medication? Yes  Has the patient been seen for an appointment in the last year OR does the patient have an upcoming appointment? Yes  Can we respond through MyChart? Yes  Agent: Please be advised that Rx refills may take up to 3 business days. We ask that you follow-up with your pharmacy.

## 2023-10-12 ENCOUNTER — Other Ambulatory Visit: Payer: Self-pay | Admitting: Family

## 2023-10-12 DIAGNOSIS — F419 Anxiety disorder, unspecified: Secondary | ICD-10-CM

## 2023-10-12 MED ORDER — SERTRALINE HCL 100 MG PO TABS: ORAL_TABLET | ORAL | 0 refills | Status: DC

## 2023-11-09 ENCOUNTER — Ambulatory Visit: Payer: Medicare Other | Admitting: Nurse Practitioner

## 2023-11-17 ENCOUNTER — Encounter: Payer: Self-pay | Admitting: Physician Assistant

## 2023-11-17 ENCOUNTER — Ambulatory Visit: Payer: Medicare Other | Admitting: Nurse Practitioner

## 2023-11-17 ENCOUNTER — Ambulatory Visit (INDEPENDENT_AMBULATORY_CARE_PROVIDER_SITE_OTHER): Admitting: Physician Assistant

## 2023-11-17 VITALS — BP 108/72 | HR 75 | Temp 97.9°F | Ht 66.54 in | Wt 143.4 lb

## 2023-11-17 DIAGNOSIS — F32A Depression, unspecified: Secondary | ICD-10-CM

## 2023-11-17 DIAGNOSIS — E559 Vitamin D deficiency, unspecified: Secondary | ICD-10-CM

## 2023-11-17 DIAGNOSIS — E039 Hypothyroidism, unspecified: Secondary | ICD-10-CM | POA: Diagnosis not present

## 2023-11-17 DIAGNOSIS — M47812 Spondylosis without myelopathy or radiculopathy, cervical region: Secondary | ICD-10-CM

## 2023-11-17 DIAGNOSIS — R7303 Prediabetes: Secondary | ICD-10-CM

## 2023-11-17 DIAGNOSIS — S161XXD Strain of muscle, fascia and tendon at neck level, subsequent encounter: Secondary | ICD-10-CM

## 2023-11-17 DIAGNOSIS — N951 Menopausal and female climacteric states: Secondary | ICD-10-CM | POA: Insufficient documentation

## 2023-11-17 DIAGNOSIS — Z1211 Encounter for screening for malignant neoplasm of colon: Secondary | ICD-10-CM

## 2023-11-17 DIAGNOSIS — F419 Anxiety disorder, unspecified: Secondary | ICD-10-CM | POA: Diagnosis not present

## 2023-11-17 DIAGNOSIS — E782 Mixed hyperlipidemia: Secondary | ICD-10-CM

## 2023-11-17 DIAGNOSIS — K219 Gastro-esophageal reflux disease without esophagitis: Secondary | ICD-10-CM

## 2023-11-17 MED ORDER — LEVOTHYROXINE SODIUM 25 MCG PO TABS: ORAL_TABLET | ORAL | 1 refills | Status: DC

## 2023-11-17 MED ORDER — CYCLOBENZAPRINE HCL 10 MG PO TABS: ORAL_TABLET | ORAL | 1 refills | Status: AC

## 2023-11-17 NOTE — Progress Notes (Signed)
 Patient ID: Kathryn [name], female    DOB: Jul 20, 1955, 68 y.o.   MRN: [id number]   Assessment & Plan:   Anxiety and depression  Hypothyroidism, unspecified type -     TSH; Future -     Levothyroxine  Sodium; TAKE 1 TABLET(25 MCG) BY MOUTH DAILY BEFORE BREAKFAST  Dispense: 90 tablet; Refill: 1  Vitamin D  deficiency -     VITAMIN D  25 Hydroxy (Vit-D Deficiency, Fractures); Future  Screening for colon cancer -     Ambulatory referral to Gastroenterology  Prediabetes -     Hemoglobin A1c; Future  Mixed hyperlipidemia -     Lipid panel; Future  Gastroesophageal reflux disease without esophagitis  Strain of neck muscle, subsequent encounter -     Cyclobenzaprine  HCl; Take  1/2 to 1 tablet  3 x /day  as needed for Muscle Spasm  Dispense: 90 tablet; Refill: 1  Cervical arthritis    Hypothyroidism Hypothyroidism with recent lapse in medication due to prescription refill issues, off medication for approximately one week. Reports fatigue, potentially related to thyroid  function. - Refill thyroid  medication for 90 days. - Monitor thyroid  function with TSH test during next lab work. - Levothyroxine  25 mcg refilled  Arthritis Arthritis with significant neck pain exacerbated by activity. Previously managed with Flexeril , which is well-tolerated. Reports headaches associated with neck tension. Discussed use of Tylenol  Arthritis in conjunction with Flexeril  for severe pain episodes. - Refill Flexeril  10 mg, instruct to take half tablet as needed. - Consider Tylenol  Arthritis in conjunction with Flexeril  for severe pain episodes.  Anxiety and depression Anxiety and occasional depression managed with Zoloft  100 mg daily and Buspar  as needed. Reports feeling overwhelmed at times but generally well-managed on current regimen. - Continue Zoloft  100 mg daily. - Use Buspar  as needed for acute anxiety episodes.  Elevated triglycerides Elevated triglycerides, likely influenced by dietary  habits including high cheese consumption. LDL levels are stable, but triglycerides remain elevated. Discussed dietary influences such as sugar and carbohydrate intake on triglyceride levels. - Schedule fasting lab work to monitor triglycerides and other relevant markers. - Encourage dietary modifications to reduce sugar and carbohydrate intake. Lab Results  Component Value Date   CHOL 144 05/12/2023   HDL 50 05/12/2023   LDLCALC 67 05/12/2023   LDLDIRECT 130.8 01/08/2013   TRIG 196 (H) 05/12/2023   CHOLHDL 2.9 05/12/2023     Gastroesophageal reflux disease (GERD) GERD managed with current medication. Stable on Omeprazole  40 mg.  Vitamin D  elevation Previously elevated vitamin D  levels, possibly due to supplementation. Current levels to be re-evaluated. Discussed potential for vitamin D  levels to be too high and the need for reassessment. - Include vitamin D  level in upcoming lab work to reassess current status.  Prediabetes Lab Results  Component Value Date   HGBA1C 5.7 (H) 01/28/2023   HGBA1C 5.8 (H) 07/19/2022   HGBA1C 5.4 01/01/2022  -Lifestyle, monitor       No follow-ups on file.    Subjective:    Chief Complaint  Patient presents with   New Patient (Initial Visit)    New Pt in office to est care with PCP; former [facility] pt; pt has had colonoscopy but thinks she is due to complete again this year. Unable to tell me exact location other than [place], referral placed. Mammogram scheduled for next month at [facility].     HPI Discussed the use of AI scribe software for clinical note transcription with the patient, who gave verbal consent to  proceed.  History of Present Illness Kathryn [name] is a 68 year old female with hypothyroidism and arthritis who presents with fatigue and neck pain.  She experiences persistent fatigue, which she attributes to her busy lifestyle involving childcare and managing a household with stairs. She has been off her thyroid  medication  for about a week due to a refill issue, which may contribute to her fatigue. She has a history of hypothyroidism and typically takes thyroid  medication.  She experiences significant neck pain, which she attributes to arthritis. The pain has been worsening with age, and she uses Flexeril , taking half of a 10 mg tablet as needed for relief. She also experiences tension headaches related to her neck pain.  She has a history of elevated cholesterol and triglycerides, which she manages with Crestor  10 mg daily. She acknowledges dietary habits, such as cheese consumption, that may contribute to her lipid levels.  She has a history of anxiety and occasional depression, for which she takes Zoloft  100 mg daily and Buspar  as needed for acute anxiety episodes.  She experiences acid reflux and takes medication for it, though the specific medication is not mentioned.  She had a colonoscopy five years ago where a suspicious polyp was removed, and she is due for a follow-up colonoscopy.  She has a family history of breast cancer and has been undergoing regular mammograms since the age of 89. She mentions a recent bone density test but has not received results, and she takes vitamin D  supplements, noting a previous high level of vitamin D  in her blood work.     Past Medical History:  Diagnosis Date   Allergy    Anxiety    Cataract    Depression    Diverticulitis 06/28/2014   Fracture of fifth metacarpal bone of left hand 09/03/2011   post fall   GERD (gastroesophageal reflux disease)    Osteoarthritis    Seasonal allergies    Thyroid  disease     Past Surgical History:  Procedure Laterality Date   APPENDECTOMY     CESAREAN SECTION     COLONOSCOPY     negative; Dr Adan Holms   FRACTURE SURGERY     ORIF FINGER FRACTURE  09/03/2011   Procedure: OPEN REDUCTION INTERNAL FIXATION (ORIF) METACARPAL (FINGER) FRACTURE;  Surgeon: Neville Barbone, MD;  Location: [facility];  Service:  Orthopedics;  Laterality: Left;   TUBAL LIGATION      Family History  Problem Relation Age of Onset   Colitis Mother    COPD Mother    Heart attack Mother 48   Anxiety disorder Mother    Arthritis Mother    Depression Mother    Varicose Veins Mother    Hypertension Father    Arthritis Father    Thyroid  disease Sister    Heart disease Maternal Grandfather    Breast cancer Other        MGaunt   Diabetes Neg Hx    Stroke Neg Hx    Colon cancer Neg Hx    Colon polyps Neg Hx    Kidney disease Neg Hx    Gallbladder disease Neg Hx    Esophageal cancer Neg Hx     Social History   Tobacco Use   Smoking status: Never   Smokeless tobacco: Never  Vaping Use   Vaping status: Never Used  Substance Use Topics   Alcohol use: No   Drug use: No     Allergies  Allergen Reactions  Amoxil  [Amoxicillin ] Itching   Penicillins Rash    ? Rash as child    Review of Systems NEGATIVE UNLESS OTHERWISE INDICATED IN HPI      Objective:     BP 108/72 (BP Location: Left Arm, Patient Position: Sitting, Cuff Size: Normal)   Pulse 75   Temp 97.9 F (36.6 C) (Temporal)   Ht 5' 6.54" (1.69 m)   Wt 143 lb 6.4 oz (65 kg)   SpO2 96%   BMI 22.77 kg/m   Wt Readings from Last 3 Encounters:  11/17/23 143 lb 6.4 oz (65 kg)  05/30/23 144 lb 6.4 oz (65.5 kg)  05/12/23 144 lb 9.6 oz (65.6 kg)    BP Readings from Last 3 Encounters:  11/17/23 108/72  05/30/23 128/80  05/12/23 130/78     Physical Exam Vitals and nursing note reviewed.  Constitutional:      Appearance: Normal appearance.  Eyes:     Extraocular Movements: Extraocular movements intact.     Conjunctiva/sclera: Conjunctivae normal.     Pupils: Pupils are equal, round, and reactive to light.  Cardiovascular:     Rate and Rhythm: Normal rate and regular rhythm.     Heart sounds: No murmur heard. Pulmonary:     Effort: Pulmonary effort is normal.     Breath sounds: Normal breath sounds.  Skin:    Findings: No rash.   Neurological:     General: No focal deficit present.     Mental Status: She is alert and oriented to person, place, and time.  Psychiatric:        Mood and Affect: Mood normal.        Behavior: Behavior normal.             Siren Porrata M Elmer Merwin, PA-C

## 2023-11-17 NOTE — Patient Instructions (Signed)
 Welcome to [facility]! It was a pleasure meeting you today.  Please schedule for fasting lab appointment this week & then a 6 month follow-up visit.   PLEASE NOTE:  If you had any LAB tests please let us  know if you have not heard back within a few days. You may see your results on MyChart before we have a chance to review them but we will give you a call once they are reviewed by us . If we ordered any REFERRALS today, please let us  know if you have not heard from their office within the next two weeks. Let us  know through MyChart if you are needing REFILLS, or have your pharmacy send us  the request. You can also use MyChart to communicate with me or any office staff.  Please try these tips to maintain a healthy lifestyle:  Eat most of your calories during the day when you are active. Eliminate processed foods including packaged sweets (pies, cakes, cookies), reduce intake of potatoes, white bread, white pasta, and white rice. Look for whole grain options, oat flour or almond flour.  Each meal should contain half fruits/vegetables, one quarter protein, and one quarter carbs (no bigger than a computer mouse).  Cut down on sweet beverages. This includes juice, soda, and sweet tea. Also watch fruit intake, though this is a healthier sweet option, it still contains natural sugar! Limit to 3 servings daily.  Drink at least 1 glass of water with each meal and aim for at least 8 glasses (64 ounces) per day.  Exercise at least 150 minutes every week to the best of your ability.    Take Care,  Ximenna Fonseca, PA-C    VISIT SUMMARY: During your visit, we discussed your ongoing issues with fatigue, neck pain, and other health concerns. We addressed your hypothyroidism, arthritis, anxiety, elevated triglycerides, and vitamin D  levels, and made plans to manage these conditions.  YOUR PLAN: HYPOTHYROIDISM: You have hypothyroidism and have been off your medication for  about a week, which may be contributing to your fatigue. -Refill your thyroid  medication for 90 days. -Monitor your thyroid  function with a TSH test during your next lab work.  ARTHRITIS: You have significant neck pain due to arthritis, which also causes tension headaches. -Refill Flexeril  10 mg and take half a tablet as needed. -Consider using Tylenol  Arthritis along with Flexeril  for severe pain episodes.  ANXIETY AND DEPRESSION: You have anxiety and occasional depression, which are generally well-managed with your current medications. -Continue taking Zoloft  100 mg daily. -Use Buspar  as needed for acute anxiety episodes.  ELEVATED TRIGLYCERIDES: Your triglyceride levels are elevated, likely due to dietary habits. -Schedule fasting lab work to monitor triglycerides and other relevant markers. -Make dietary changes to reduce sugar and carbohydrate intake.  GASTROESOPHAGEAL REFLUX DISEASE (GERD): You have GERD, which is managed with your current medication. -Continue taking your current medication for GERD.  VITAMIN D  ELEVATION: Your vitamin D  levels were previously high, possibly due to supplementation. -Include a vitamin D  level test in your upcoming lab work to reassess your current status.                      Contains text generated by Abridge.                                 Contains text generated by Abridge.

## 2023-11-22 ENCOUNTER — Telehealth: Payer: Self-pay | Admitting: *Deleted

## 2023-11-22 ENCOUNTER — Other Ambulatory Visit (INDEPENDENT_AMBULATORY_CARE_PROVIDER_SITE_OTHER)

## 2023-11-22 ENCOUNTER — Other Ambulatory Visit: Payer: Self-pay

## 2023-11-22 ENCOUNTER — Ambulatory Visit: Payer: Self-pay | Admitting: Physician Assistant

## 2023-11-22 DIAGNOSIS — E039 Hypothyroidism, unspecified: Secondary | ICD-10-CM

## 2023-11-22 DIAGNOSIS — E559 Vitamin D deficiency, unspecified: Secondary | ICD-10-CM

## 2023-11-22 DIAGNOSIS — E782 Mixed hyperlipidemia: Secondary | ICD-10-CM

## 2023-11-22 DIAGNOSIS — R7303 Prediabetes: Secondary | ICD-10-CM | POA: Diagnosis not present

## 2023-11-22 LAB — VITAMIN D 25 HYDROXY (VIT D DEFICIENCY, FRACTURES): VITD: 115.86 ng/mL (ref 30.00–100.00)

## 2023-11-22 LAB — LIPID PANEL
Cholesterol: 142 mg/dL (ref 0–200)
HDL: 48.3 mg/dL (ref >39.00)
LDL Cholesterol: 63 mg/dL (ref 0–99)
NonHDL: 93.76
Total CHOL/HDL Ratio: 3
Triglycerides: 153 mg/dL — ABNORMAL HIGH (ref 0.0–149.0)
VLDL: 30.6 mg/dL (ref 0.0–40.0)

## 2023-11-22 LAB — HEMOGLOBIN A1C: Hgb A1c MFr Bld: 5.8 % (ref 4.6–6.5)

## 2023-11-22 LAB — TSH: TSH: 3.97 u[IU]/mL (ref 0.35–5.50)

## 2023-11-22 NOTE — Telephone Encounter (Signed)
 CRITICAL VALUE STICKER  CRITICAL VALUE: Vit D= 115.86  RECEIVER (on-site recipient of call): Labrea Eccleston   DATE & TIME NOTIFIED: 11/22/23 at 11:34  MESSENGER (representative from lab): Adolm Ahumada  MD NOTIFIED: Marylyn Sofia, PA-C  TIME OF NOTIFICATION: 11:36

## 2023-11-22 NOTE — Telephone Encounter (Signed)
 Called pt and advised PCP recommendations and pt verbalized understanding. Future lab orders placed to recheck Vit D in 8-12 wks.

## 2023-11-24 ENCOUNTER — Other Ambulatory Visit: Payer: Self-pay

## 2023-11-24 ENCOUNTER — Other Ambulatory Visit: Payer: Self-pay | Admitting: Physician Assistant

## 2023-11-24 DIAGNOSIS — E782 Mixed hyperlipidemia: Secondary | ICD-10-CM

## 2023-11-24 NOTE — Telephone Encounter (Signed)
 Previously filled by former provider please advise

## 2023-11-29 LAB — HM MAMMOGRAPHY

## 2023-12-01 ENCOUNTER — Encounter: Payer: Self-pay | Admitting: Physician Assistant

## 2024-01-04 ENCOUNTER — Other Ambulatory Visit: Payer: Self-pay | Admitting: Family

## 2024-01-04 DIAGNOSIS — F419 Anxiety disorder, unspecified: Secondary | ICD-10-CM

## 2024-01-05 ENCOUNTER — Ambulatory Visit (INDEPENDENT_AMBULATORY_CARE_PROVIDER_SITE_OTHER)

## 2024-01-05 VITALS — Ht 67.0 in | Wt 143.0 lb

## 2024-01-05 DIAGNOSIS — Z0000 Encounter for general adult medical examination without abnormal findings: Secondary | ICD-10-CM

## 2024-01-05 NOTE — Progress Notes (Signed)
 Subjective:   Kathryn [name] is a 68 y.o. who presents for a Medicare Wellness preventive visit.  As a reminder, Annual Wellness Visits don't include a physical exam, and some assessments may be limited, especially if this visit is performed virtually. We may recommend an in-person follow-up visit with your provider if needed.  Visit Complete: Virtual I connected with  Kathryn [name] on 01/05/24 by a video and audio enabled telemedicine application and verified that I am speaking with the correct person using two identifiers.  Patient Location: Home  Provider Location: Office/Clinic  I discussed the limitations of evaluation and management by telemedicine. The patient expressed understanding and agreed to proceed.  Vital Signs: Because this visit was a virtual/telehealth visit, some criteria may be missing or patient reported. Any vitals not documented were not able to be obtained and vitals that have been documented are patient reported.    Persons Participating in Visit: Patient.  AWV Questionnaire: No: Patient Medicare AWV questionnaire was not completed prior to this visit.  Cardiac Risk Factors include: advanced age (>3men, >28 women);dyslipidemia;hypertension     Objective:    Today's Vitals   01/05/24 0908  Weight: 143 lb (64.9 kg)  Height: 5' 7 (1.702 m)   Body mass index is 22.4 kg/m.     01/05/2024    9:13 AM 10/28/2022   10:51 AM 08/31/2011   12:45 PM  Advanced Directives  Does Patient Have a Medical Advance Directive? No No Patient does not have advance directive   Would patient like information on creating a medical advance directive? No - Patient declined Yes (MAU/Ambulatory/Procedural Areas - Information given)      Data saved with a previous flowsheet row definition    Current Medications (verified) Outpatient Encounter Medications as of 01/05/2024  Medication Sig   albuterol  (VENTOLIN  HFA) 108 (90 Base) MCG/ACT inhaler Inhale 2 puffs into the lungs  every 6 (six) hours as needed for wheezing or shortness of breath.   busPIRone  (BUSPAR ) 10 MG tablet Take 1 tablet (10 mg total) by mouth 2 (two) times daily.   Cholecalciferol (VITAMIN D -3) 125 MCG (5000 UT) TABS Take by mouth.   cyclobenzaprine  (FLEXERIL ) 10 MG tablet Take  1/2 to 1 tablet  3 x /day  as needed for Muscle Spasm   famotidine (PEPCID) 20 MG tablet Take 20 mg by mouth daily. Prior to dinner. Gets OTC   levothyroxine  (SYNTHROID ) 25 MCG tablet TAKE 1 TABLET(25 MCG) BY MOUTH DAILY BEFORE BREAKFAST   omeprazole  (PRILOSEC) 40 MG capsule TAKE 1 CAPSULE BY MOUTH DAILY BEFORE BREAKFAST   rosuvastatin  (CRESTOR ) 10 MG tablet TAKE 1 TABLET BY MOUTH DAILY FOR CHOLESTEROL   sertraline  (ZOLOFT ) 100 MG tablet Take 1 & 1/2 tablet (150 mg)  Daily  for Mood                                             /                                   TAKE                       BY                    MOUTH  sucralfate  (CARAFATE ) 1 g tablet    No facility-administered encounter medications on file as of 01/05/2024.    Allergies (verified) Amoxil  [amoxicillin ] and Penicillins   History: Past Medical History:  Diagnosis Date   Allergy    Anxiety    Cataract    Depression    Diverticulitis 06/28/2014   Fracture of fifth metacarpal bone of left hand 09/03/2011   post fall   GERD (gastroesophageal reflux disease)    Osteoarthritis    Seasonal allergies    Thyroid  disease    Past Surgical History:  Procedure Laterality Date   APPENDECTOMY     CESAREAN SECTION     COLONOSCOPY     negative; Dr Jakie   FRACTURE SURGERY     ORIF FINGER FRACTURE  09/03/2011   Procedure: OPEN REDUCTION INTERNAL FIXATION (ORIF) METACARPAL (FINGER) FRACTURE;  Surgeon: Fonda SHAUNNA Olmsted, MD;  Location: [facility];  Service: Orthopedics;  Laterality: Left;   TUBAL LIGATION     Family History  Problem Relation Age of Onset   Colitis Mother    COPD Mother    Heart attack Mother 68   Anxiety disorder Mother     Arthritis Mother    Depression Mother    Varicose Veins Mother    Hypertension Father    Arthritis Father    Thyroid  disease Sister    Heart disease Maternal Grandfather    Breast cancer Other        MGaunt   Diabetes Neg Hx    Stroke Neg Hx    Colon cancer Neg Hx    Colon polyps Neg Hx    Kidney disease Neg Hx    Gallbladder disease Neg Hx    Esophageal cancer Neg Hx    Social History   Socioeconomic History   Marital status: Married    Spouse name: Not on file   Number of children: 2   Years of education: Not on file   Highest education level: 12th grade  Occupational History   Occupation: Stay at home mom  Tobacco Use   Smoking status: Never   Smokeless tobacco: Never  Vaping Use   Vaping status: Never Used  Substance and Sexual Activity   Alcohol use: No   Drug use: No   Sexual activity: Not Currently  Other Topics Concern   Not on file  Social History Narrative   Not on file   Social Drivers of Health   Financial Resource Strain: Low Risk  (01/05/2024)   Overall Financial Resource Strain (CARDIA)    Difficulty of Paying Living Expenses: Not hard at all  Food Insecurity: No Food Insecurity (01/05/2024)   Hunger Vital Sign    Worried About Running Out of Food in the Last Year: Never true    Ran Out of Food in the Last Year: Never true  Transportation Needs: No Transportation Needs (01/05/2024)   PRAPARE - Administrator, Civil Service (Medical): No    Lack of Transportation (Non-Medical): No  Physical Activity: Inactive (01/05/2024)   Exercise Vital Sign    Days of Exercise per Week: 0 days    Minutes of Exercise per Session: 0 min  Stress: No Stress Concern Present (01/05/2024)   [employer] of Occupational Health - Occupational Stress Questionnaire    Feeling of Stress: Only a little  Recent Concern: Stress - Stress Concern Present (11/16/2023)   [employer] of Occupational Health - Occupational Stress Questionnaire    Feeling of  Stress :  To some extent  Social Connections: Moderately Integrated (01/05/2024)   Social Connection and Isolation Panel    Frequency of Communication with Friends and Family: More than three times a week    Frequency of Social Gatherings with Friends and Family: More than three times a week    Attends Religious Services: More than 4 times per year    Active Member of [employer] or Organizations: No    Attends Banker Meetings: Never    Marital Status: Married    Tobacco Counseling Counseling given: Not Answered    Clinical Intake:  Pre-visit preparation completed: Yes  Pain : No/denies pain     BMI - recorded: 22.4 Nutritional Status: BMI of 19-24  Normal Nutritional Risks: None Diabetes: No  Lab Results  Component Value Date   HGBA1C 5.8 11/22/2023   HGBA1C 5.7 (H) 01/28/2023   HGBA1C 5.8 (H) 07/19/2022     How often do you need to have someone help you when you read instructions, pamphlets, or other written materials from your doctor or pharmacy?: 1 - Never  Interpreter Needed?: No  Information entered by :: Ellouise Haws, LPN   Activities of Daily Living     01/05/2024    9:11 AM  In your present state of health, do you have any difficulty performing the following activities:  Hearing? 0  Vision? 0  Difficulty concentrating or making decisions? 0  Walking or climbing stairs? 0  Dressing or bathing? 0  Doing errands, shopping? 0  Preparing Food and eating ? N  Using the Toilet? N  In the past six months, have you accidently leaked urine? Y  Comment at times with sneezing  Do you have problems with loss of bowel control? N  Managing your Medications? N  Managing your Finances? N  Housekeeping or managing your Housekeeping? N    Patient Care Team: Allwardt, Alyssa M, PA-C as PCP - General (Physician Assistant)  I have updated your Care Teams any recent Medical Services you may have received from other providers in the past year.      Assessment:   This is a routine wellness examination for Corri.  Hearing/Vision screen Hearing Screening - Comments:: Pt denies any hearing issues  Vision Screening - Comments:: Wears rx glasses - up to date with routine eye exams with Dr Medford Gaudy    Goals Addressed             This Visit's Progress    Patient Stated       Maintain health and activity        Depression Screen     01/05/2024    9:13 AM 11/17/2023   10:00 AM 10/28/2022   11:02 AM 12/31/2021   10:07 PM 12/12/2020   12:20 AM 12/02/2019    7:44 PM 06/30/2017   11:07 AM  PHQ 2/9 Scores  PHQ - 2 Score 0 0 0 0 0 0 2  PHQ- 9 Score 0 5     4  Exception Documentation     Other- indicate reason in comment box      Fall Risk     01/05/2024    9:15 AM 11/17/2023    9:59 AM 10/28/2022   11:01 AM 12/31/2021   10:06 PM 12/12/2020   12:18 AM  Fall Risk   Falls in the past year? 0 0 0 0 0  Number falls in past yr: 0 0 0    Injury with Fall? 0 0 0  Risk for fall due to : No Fall Risks No Fall Risks No Fall Risks No Fall Risks No Fall Risks  Follow up Falls prevention discussed Falls evaluation completed Falls evaluation completed;Falls prevention discussed Falls evaluation completed;Education provided;Falls prevention discussed  Falls evaluation completed;Education provided;Falls prevention discussed      Data saved with a previous flowsheet row definition    MEDICARE RISK AT HOME:  Medicare Risk at Home Any stairs in or around the home?: Yes If so, are there any without handrails?: No Home free of loose throw rugs in walkways, pet beds, electrical cords, etc?: Yes Adequate lighting in your home to reduce risk of falls?: Yes Life alert?: No Use of a cane, walker or w/c?: No Grab bars in the bathroom?: No Shower chair or bench in shower?: No Elevated toilet seat or a handicapped toilet?: No  TIMED UP AND GO:  Was the test performed?  No  Cognitive Function: 6CIT completed        01/05/2024    9:16 AM  6CIT  Screen  What Year? 0 points  What month? 0 points  What time? 0 points  Count back from 20 0 points  Months in reverse 0 points  Repeat phrase 4 points  Total Score 4 points    Immunizations Immunization History  Administered Date(s) Administered   Influenza, High Dose Seasonal PF 08/06/2023   Influenza,inj,Quad PF,6+ Mos 04/04/2017, 02/22/2019, 06/18/2021, 04/13/2022   Influenza-Unspecified 04/06/2013, 04/28/2018   Novel Infuenza-h1n1-09 06/10/2008   PFIZER(Purple Top)SARS-COV-2 Vaccination 01/19/2020, 02/09/2020   PPD Test 12/03/2019, 01/01/2022   Td 10/03/2006   Tdap 04/04/2017    Screening Tests Health Maintenance  Topic Date Due   COVID-19 Vaccine (3 - Pfizer risk series) 03/08/2020   Zoster Vaccines- Shingrix (1 of 2) 02/17/2024 (Originally 09/30/1974)   Pneumococcal Vaccine: 50+ Years (1 of 1 - PCV) 11/16/2024 (Originally 10/01/2007)   INFLUENZA VACCINE  01/27/2024   Medicare Annual Wellness (AWV)  01/04/2025   MAMMOGRAM  11/28/2025   DTaP/Tdap/Td (3 - Td or Tdap) 04/05/2027   DEXA SCAN  Completed   Hepatitis C Screening  Completed   Hepatitis B Vaccines  Aged Out   HPV VACCINES  Aged Out   Meningococcal B Vaccine  Aged Out   Fecal DNA (Cologuard)  Discontinued    Health Maintenance  Health Maintenance Due  Topic Date Due   COVID-19 Vaccine (3 - Pfizer risk series) 03/08/2020   Health Maintenance Items Addressed: See Nurse Notes at the end of this note  Additional Screening:  Vision Screening: Recommended annual ophthalmology exams for early detection of glaucoma and other disorders of the eye. Would you like a referral to an eye doctor? No    Dental Screening: Recommended annual dental exams for proper oral hygiene  Community Resource Referral / Chronic Care Management: CRR required this visit?  No   CCM required this visit?  No   Plan:    I have personally reviewed and noted the following in the patient's chart:   Medical and social  history Use of alcohol, tobacco or illicit drugs  Current medications and supplements including opioid prescriptions. Patient is not currently taking opioid prescriptions. Functional ability and status Nutritional status Physical activity Advanced directives List of other physicians Hospitalizations, surgeries, and ER visits in previous 12 months Vitals Screenings to include cognitive, depression, and falls Referrals and appointments  In addition, I have reviewed and discussed with patient certain preventive protocols, quality metrics, and best practice recommendations. A written personalized  care plan for preventive services as well as general preventive health recommendations were provided to patient.   Ellouise VEAR Haws, LPN   2/89/7974   After Visit Summary: (MyChart) Due to this being a telephonic visit, the after visit summary with patients personalized plan was offered to patient via MyChart   Notes: Nothing significant to report at this time.

## 2024-01-05 NOTE — Patient Instructions (Signed)
 Ms. Kathryn [name] , Thank you for taking time out of your busy schedule to complete your Annual Wellness Visit with me. I enjoyed our conversation and look forward to speaking with you again next year. I, as well as your care team,  appreciate your ongoing commitment to your health goals. Please review the following plan we discussed and let me know if I can assist you in the future. Your Game plan/ To Do List    Referrals: If you haven't heard from the office you've been referred to, please reach out to them at the phone provided.   Follow up Visits: Next Medicare AWV with our clinical staff: 01/10/25   Have you seen your provider in the last 6 months (3 months if uncontrolled diabetes)? Yes Next Office Visit with your provider: 05/21/24  Clinician Recommendations:  Aim for 30 minutes of exercise or brisk walking, 6-8 glasses of water, and 5 servings of fruits and vegetables each day.       This is a list of the screening recommended for you and due dates:  Health Maintenance  Topic Date Due   COVID-19 Vaccine (3 - Pfizer risk series) 03/08/2020   Medicare Annual Wellness Visit  10/28/2023   Zoster (Shingles) Vaccine (1 of 2) 02/17/2024*   Pneumococcal Vaccine for age over 43 (1 of 1 - PCV) 11/16/2024*   Flu Shot  01/27/2024   Mammogram  11/28/2025   DTaP/Tdap/Td vaccine (3 - Td or Tdap) 04/05/2027   DEXA scan (bone density measurement)  Completed   Hepatitis C Screening  Completed   Hepatitis B Vaccine  Aged Out   HPV Vaccine  Aged Out   Meningitis B Vaccine  Aged Out   Cologuard (Stool DNA test)  Discontinued  *Topic was postponed. The date shown is not the original due date.    Advanced directives: (Declined) Advance directive discussed with you today. Even though you declined this today, please call our office should you change your mind, and we can give you the proper paperwork for you to fill out. Advance Care Planning is important because it:  [x]  Makes sure you receive the medical  care that is consistent with your values, goals, and preferences  [x]  It provides guidance to your family and loved ones and reduces their decisional burden about whether or not they are making the right decisions based on your wishes.  Follow the link provided in your after visit summary or read over the paperwork we have mailed to you to help you started getting your Advance Directives in place. If you need assistance in completing these, please reach out to us  so that we can help you!  See attachments for Preventive Care and Fall Prevention Tips.

## 2024-01-09 ENCOUNTER — Telehealth: Payer: Self-pay | Admitting: Physician Assistant

## 2024-01-09 DIAGNOSIS — F419 Anxiety disorder, unspecified: Secondary | ICD-10-CM

## 2024-01-09 MED ORDER — SERTRALINE HCL 100 MG PO TABS
ORAL_TABLET | ORAL | 0 refills | Status: DC
Start: 2024-01-09 — End: 2024-04-02

## 2024-01-09 NOTE — Telephone Encounter (Signed)
 Copied from CRM [id number]. Topic: Clinical - Medication Refill >> Jan 09, 2024  9:04 AM Laymon HERO wrote: Medication: sertraline  (ZOLOFT ) 100 MG tablet  Has the patient contacted their pharmacy? Yes (Agent: If no, request that the patient contact the pharmacy for the refill. If patient does not wish to contact the pharmacy document the reason why and proceed with request.) (Agent: If yes, when and what did the pharmacy advise?)  This is the patient's preferred pharmacy:  [id number] [place] [place], [place] - [address] AT [facility] [address] [place] [address] Phone: [phone] Fax: [phone]    Is this the correct pharmacy for this prescription? Yes If no, delete pharmacy and type the correct one.   Has the prescription been filled recently? Yes  Is the patient out of the medication? Yes  Has the patient been seen for an appointment in the last year OR does the patient have an upcoming appointment? Yes  Can we respond through MyChart? Yes  Agent: Please be advised that Rx refills may take up to 3 business days. We ask that you follow-up with your pharmacy.

## 2024-01-12 ENCOUNTER — Encounter: Payer: Self-pay | Admitting: Physician Assistant

## 2024-01-23 ENCOUNTER — Ambulatory Visit (INDEPENDENT_AMBULATORY_CARE_PROVIDER_SITE_OTHER): Admitting: Physician Assistant

## 2024-01-23 ENCOUNTER — Encounter: Payer: Self-pay | Admitting: Physician Assistant

## 2024-01-23 VITALS — BP 122/82 | HR 75 | Temp 97.7°F | Ht 67.0 in | Wt 140.8 lb

## 2024-01-23 DIAGNOSIS — R051 Acute cough: Secondary | ICD-10-CM | POA: Diagnosis not present

## 2024-01-23 DIAGNOSIS — J0100 Acute maxillary sinusitis, unspecified: Secondary | ICD-10-CM | POA: Diagnosis not present

## 2024-01-23 DIAGNOSIS — Z1211 Encounter for screening for malignant neoplasm of colon: Secondary | ICD-10-CM

## 2024-01-23 DIAGNOSIS — Z1283 Encounter for screening for malignant neoplasm of skin: Secondary | ICD-10-CM

## 2024-01-23 DIAGNOSIS — E559 Vitamin D deficiency, unspecified: Secondary | ICD-10-CM | POA: Diagnosis not present

## 2024-01-23 DIAGNOSIS — E673 Hypervitaminosis D: Secondary | ICD-10-CM

## 2024-01-23 MED ORDER — AZITHROMYCIN 250 MG PO TABS: ORAL_TABLET | ORAL | 0 refills | Status: DC

## 2024-01-23 MED ORDER — BENZONATATE 100 MG PO CAPS
100.0000 mg | ORAL_CAPSULE | Freq: Three times a day (TID) | ORAL | 0 refills | Status: DC | PRN
Start: 2024-01-23 — End: 2024-05-09

## 2024-01-23 NOTE — Patient Instructions (Signed)
 Please call [facility] to schedule your colonoscopy. [phone]  Referral placed to dermatology.  Take medications as directed. Feel better soon!  Call if any concerns

## 2024-01-23 NOTE — Progress Notes (Signed)
 Patient ID: Kathryn [name], female    DOB: Jun 06, 1956, 68 y.o.   MRN: [id number]   Assessment & Plan:  Hypervitaminosis D  Acute cough  Acute maxillary sinusitis, recurrence not specified  Screening for colon cancer  Skin cancer screening -     Ambulatory referral to Dermatology  Vitamin D  deficiency -     VITAMIN D  25 Hydroxy (Vit-D Deficiency, Fractures)  Other orders -     Azithromycin ; Take two tablets on day one, followed by one tablet daily for the next four days.  Dispense: 6 tablet; Refill: 0 -     Benzonatate ; Take 1 capsule (100 mg total) by mouth 3 (three) times daily as needed for cough.  Dispense: 30 capsule; Refill: 0      Assessment and Plan Assessment & Plan Upper respiratory infection Presents with symptoms of an upper respiratory infection, including coughing, sinus congestion, and production of yellow-green sputum. Symptoms have persisted despite the use of over-the-counter medications such as Allegra, Mucinex , and Flonase , indicating a need for medical intervention. Zithromax  (Z-Pak) is chosen due to her tolerance and its effectiveness in treating respiratory infections. - Prescribe Zithromax  (Z-Pak). - Continue using allergy medications and nasal sprays as needed.  Skin lesion -Resolved at this time  - Refer to dermatology for a regular skin check   General Health Maintenance Advised to boost her immune system, especially when caring for her sick grandchild. Previous vitamin D  levels were high, and she has not been taking supplements or getting much sun exposure recently. Checking vitamin D  levels is necessary to ensure she has returned to a normal range. - Check vitamin D  levels. - Consider taking zinc or vitamin C supplements.   Follow-up Needs to follow up with gastroenterology for a colonoscopy referral that was previously missed due to communication issues. She did not receive the mailed referral letter and needs to contact gastroenterology to  reschedule. - Provide the contact number for gastroenterology to reschedule the colonoscopy appointment.      No follow-ups on file.    Subjective:    Chief Complaint  Patient presents with   Neck Pain    Pt in office for redness underneath her neck and puffiness; pt states after 2 months it went away, but didn't cancel appt because feeling like URI and sinus pressure.     Neck Pain    Discussed the use of AI scribe software for clinical note transcription with the patient, who gave verbal consent to proceed.  History of Present Illness Kathryn [name] is a 68 year old female who presents with a persistent upper respiratory infection and concerns about a neck growth.  She has symptoms of an upper respiratory infection, including coughing, sinus congestion, and fatigue. She is expectorating phlegm and reports blowing out yellow-green material. Despite using Allegra, Flonase , and Mucinex , her symptoms persist. She is currently taking Allegra and Mucinex . She has been caring for a sick three-year-old child, which may have contributed to her illness.  She is concerned about a neck growth that initially appeared as a red, oblong, puffy, itchy, and tender area. This growth was present for over two months but resolved before her appointment. She is worried about its potential recurrence, noting its proximity to a long-standing skin tag. She has a history of skin lesion removal from her arm but no history of skin cancer. She has many freckles and is concerned about skin changes.  No changes in stool, including blood, thin, greasy, or black stool. She  has not been taking vitamin D  supplements due to previously high levels and has not been spending much time outdoors due to the heat.     Past Medical History:  Diagnosis Date   Allergy    Anxiety    Cataract    Depression    Diverticulitis 06/28/2014   Fracture of fifth metacarpal bone of left hand 09/03/2011   post fall   GERD  (gastroesophageal reflux disease)    Osteoarthritis    Seasonal allergies    Thyroid  disease     Past Surgical History:  Procedure Laterality Date   APPENDECTOMY     CESAREAN SECTION     COLONOSCOPY     negative; Dr Jakie   FRACTURE SURGERY     ORIF FINGER FRACTURE  09/03/2011   Procedure: OPEN REDUCTION INTERNAL FIXATION (ORIF) METACARPAL (FINGER) FRACTURE;  Surgeon: Fonda SHAUNNA Olmsted, MD;  Location:  [facility];  Service: Orthopedics;  Laterality: Left;   TUBAL LIGATION      Family History  Problem Relation Age of Onset   Colitis Mother    COPD Mother    Heart attack Mother 49   Anxiety disorder Mother    Arthritis Mother    Depression Mother    Varicose Veins Mother    Hypertension Father    Arthritis Father    Thyroid  disease Sister    Heart disease Maternal Grandfather    Breast cancer Other        MGaunt   Diabetes Neg Hx    Stroke Neg Hx    Colon cancer Neg Hx    Colon polyps Neg Hx    Kidney disease Neg Hx    Gallbladder disease Neg Hx    Esophageal cancer Neg Hx     Social History   Tobacco Use   Smoking status: Never   Smokeless tobacco: Never  Vaping Use   Vaping status: Never Used  Substance Use Topics   Alcohol use: No   Drug use: No     Allergies  Allergen Reactions   Amoxil  [Amoxicillin ] Itching   Penicillins Rash    ? Rash as child    Review of Systems  Musculoskeletal:  Positive for neck pain.   NEGATIVE UNLESS OTHERWISE INDICATED IN HPI      Objective:     BP 122/82 (BP Location: Left Arm, Patient Position: Sitting, Cuff Size: Normal)   Pulse 75   Temp 97.7 F (36.5 C) (Temporal)   Ht 5' 7 (1.702 m)   Wt 140 lb 12.8 oz (63.9 kg)   SpO2 97%   BMI 22.05 kg/m   Wt Readings from Last 3 Encounters:  01/23/24 140 lb 12.8 oz (63.9 kg)  01/05/24 143 lb (64.9 kg)  11/17/23 143 lb 6.4 oz (65 kg)    BP Readings from Last 3 Encounters:  01/23/24 122/82  11/17/23 108/72  05/30/23 128/80     Physical  Exam Vitals and nursing note reviewed.  Constitutional:      General: She is not in acute distress.    Appearance: Normal appearance. She is not ill-appearing.  HENT:     Head: Normocephalic.     Right Ear: Tympanic membrane, ear canal and external ear normal.     Left Ear: Tympanic membrane, ear canal and external ear normal.     Nose: Congestion present.     Mouth/Throat:     Mouth: Mucous membranes are moist.     Pharynx: No oropharyngeal exudate or posterior oropharyngeal  erythema.  Eyes:     Extraocular Movements: Extraocular movements intact.     Conjunctiva/sclera: Conjunctivae normal.     Pupils: Pupils are equal, round, and reactive to light.  Cardiovascular:     Rate and Rhythm: Normal rate and regular rhythm.     Pulses: Normal pulses.     Heart sounds: Normal heart sounds. No murmur heard. Pulmonary:     Effort: Pulmonary effort is normal. No respiratory distress.     Breath sounds: Normal breath sounds. No wheezing.  Musculoskeletal:     Cervical back: Normal range of motion.  Skin:    General: Skin is warm.  Neurological:     Mental Status: She is alert and oriented to person, place, and time.  Psychiatric:        Mood and Affect: Mood normal.        Behavior: Behavior normal.             Xzavier Swinger M Shirly Bartosiewicz, PA-C

## 2024-01-24 ENCOUNTER — Ambulatory Visit: Payer: Self-pay | Admitting: Physician Assistant

## 2024-01-24 ENCOUNTER — Telehealth: Payer: Self-pay | Admitting: Physician Assistant

## 2024-01-24 LAB — VITAMIN D 25 HYDROXY (VIT D DEFICIENCY, FRACTURES): VITD: 94.75 ng/mL (ref 30.00–100.00)

## 2024-01-24 NOTE — Telephone Encounter (Signed)
 Noted and agreed, thank you.

## 2024-01-24 NOTE — Telephone Encounter (Signed)
 Noted, will request results.    Copied from CRM [id number]. Topic: General - Other >> Jan 24, 2024 11:26 AM Macario HERO wrote: Reason for CRM: Patient calling to provide her provider the name of the doctor who performed her colonoscopy. The location was [facility]  - Dr. Shanna Denmark - [phone]

## 2024-01-25 ENCOUNTER — Other Ambulatory Visit: Payer: Self-pay | Admitting: Family

## 2024-01-25 ENCOUNTER — Telehealth: Payer: Self-pay | Admitting: Gastroenterology

## 2024-01-25 DIAGNOSIS — K2100 Gastro-esophageal reflux disease with esophagitis, without bleeding: Secondary | ICD-10-CM

## 2024-01-25 NOTE — Telephone Encounter (Signed)
 Good Afternoon Dr. San,   DOD for today 7/30 PM   Patient called stating that her PCP Alyssa Allwardt had sent a referral to our practice for a colonoscopy. Patient states her last colonoscopy was in 2020 with [facility] and was told she needed to repeat in 5 years. Patients records are in [facility], will you please review and advise on scheduling?   Thank you

## 2024-01-26 NOTE — Telephone Encounter (Signed)
 Dr. San,  I have requested records from procedure in 2020, awaiting records.

## 2024-01-27 NOTE — Telephone Encounter (Signed)
 Dr. San,   We have received patients previous records and can be found under the media tab.  Will you please review and advise on scheduling patient?  Thank you.

## 2024-01-30 ENCOUNTER — Other Ambulatory Visit: Payer: Self-pay | Admitting: Physician Assistant

## 2024-01-30 DIAGNOSIS — K2100 Gastro-esophageal reflux disease with esophagitis, without bleeding: Secondary | ICD-10-CM

## 2024-01-30 MED ORDER — OMEPRAZOLE 40 MG PO CPDR: DELAYED_RELEASE_CAPSULE | ORAL | 0 refills | Status: DC

## 2024-01-30 NOTE — Telephone Encounter (Signed)
 Copied from CRM [id number]. Topic: Clinical - Medication Refill >> Jan 30, 2024  3:03 PM Corin V wrote: Medication: omeprazole  (PRILOSEC) 40 MG capsule, would like refills  Has the patient contacted their pharmacy? Yes (Agent: If no, request that the patient contact the pharmacy for the refill. If patient does not wish to contact the pharmacy document the reason why and proceed with request.) (Agent: If yes, when and what did the pharmacy advise?)  This is the patient's preferred pharmacy:  [facility] ST[id number] [address] [place], [place] - [address] AT [facility] [address] [place] [address] Phone: [phone] Fax: [phone]  Is this the correct pharmacy for this prescription? Yes If no, delete pharmacy and type the correct one.   Has the prescription been filled recently? No  Is the patient out of the medication? No  Has the patient been seen for an appointment in the last year OR does the patient have an upcoming appointment? Yes  Can we respond through MyChart? Yes  Agent: Please be advised that Rx refills may take up to 3 business days. We ask that you follow-up with your pharmacy.

## 2024-02-07 ENCOUNTER — Encounter: Payer: Self-pay | Admitting: Gastroenterology

## 2024-02-07 ENCOUNTER — Other Ambulatory Visit: Payer: Self-pay | Admitting: Physician Assistant

## 2024-02-07 DIAGNOSIS — E039 Hypothyroidism, unspecified: Secondary | ICD-10-CM

## 2024-02-07 NOTE — Telephone Encounter (Signed)
 Patient has been scheduled for colonoscopy on 9/26 at 7:30 and PV on 9/12 at 10:30

## 2024-02-09 ENCOUNTER — Encounter: Payer: Medicare Other | Admitting: Internal Medicine

## 2024-02-24 ENCOUNTER — Encounter: Payer: Medicare Other | Admitting: Internal Medicine

## 2024-03-09 ENCOUNTER — Ambulatory Visit (AMBULATORY_SURGERY_CENTER): Admitting: *Deleted

## 2024-03-09 ENCOUNTER — Encounter: Payer: Self-pay | Admitting: Gastroenterology

## 2024-03-09 VITALS — Ht 68.0 in | Wt 140.0 lb

## 2024-03-09 DIAGNOSIS — Z860100 Personal history of colon polyps, unspecified: Secondary | ICD-10-CM

## 2024-03-09 MED ORDER — NA SULFATE-K SULFATE-MG SULF 17.5-3.13-1.6 GM/177ML PO SOLN: 1.0000 | Freq: Once | ORAL | 0 refills | Status: AC

## 2024-03-09 NOTE — Progress Notes (Signed)
 Pre visit completed over telephone. Instructions forwarded through [facility] and secure email   No egg or soy allergy known to patient  No issues known to pt with past sedation with any surgeries or procedures Patient denies ever being told they had issues or difficulty with intubation  No FH of Malignant Hyperthermia Pt is not on diet pills Pt is not on  home 02  Pt is not on blood thinners  Pt denies issues with constipation  No A fib or A flutter Have any cardiac testing pending--NO Pt instructed to use Singlecare.com or GoodRx for a price reduction on prep

## 2024-03-20 ENCOUNTER — Telehealth: Payer: Self-pay | Admitting: Gastroenterology

## 2024-03-20 NOTE — Telephone Encounter (Signed)
 Returned patients call regarding cold symptoms.  Patient is hoarse but not coughing or sneezing.  Patient does not have a fever or body aches.  Patient verbally understands that if she does acquire a fever and body aches that she will need to call back and reschedule the colonoscopy.

## 2024-03-20 NOTE — Telephone Encounter (Signed)
 Received a call from patient stating she has upcoming procedure 9/26 but is experiencing cold symptoms, requesting nurse fu. Please review and advise   Thank you

## 2024-03-23 ENCOUNTER — Encounter: Payer: Self-pay | Admitting: Gastroenterology

## 2024-03-23 ENCOUNTER — Ambulatory Visit: Admitting: Gastroenterology

## 2024-03-23 VITALS — BP 130/76 | HR 73 | Temp 97.4°F | Resp 12 | Ht 67.0 in | Wt 138.0 lb

## 2024-03-23 DIAGNOSIS — K5730 Diverticulosis of large intestine without perforation or abscess without bleeding: Secondary | ICD-10-CM | POA: Diagnosis not present

## 2024-03-23 DIAGNOSIS — Z1211 Encounter for screening for malignant neoplasm of colon: Secondary | ICD-10-CM

## 2024-03-23 DIAGNOSIS — Z860101 Personal history of adenomatous and serrated colon polyps: Secondary | ICD-10-CM

## 2024-03-23 DIAGNOSIS — Z860100 Personal history of colon polyps, unspecified: Secondary | ICD-10-CM

## 2024-03-23 MED ORDER — SODIUM CHLORIDE 0.9 % IV SOLN: 500.0000 mL | Freq: Once | INTRAVENOUS | Status: AC

## 2024-03-23 NOTE — Progress Notes (Signed)
 GASTROENTEROLOGY PROCEDURE H&P NOTE   Primary Care Physician: Allwardt, Mardy HERO, PA-C    Reason for Procedure:  Colon polyp surveillance  Plan:    Colonoscopy  Patient is appropriate for endoscopic procedure(s) in the ambulatory ([facility]) setting.  The nature of the procedure, as well as the risks, benefits, and alternatives were carefully and thoroughly reviewed with the patient. Ample time for discussion and questions allowed. The patient understood, was satisfied, and agreed to proceed.     HPI: Kathryn [name] is a 68 y.o. female who presents for colonoscopy for ongoing colon polyp surveillance and colon cancer screening.  No active GI symptoms.  No known family history of colon cancer or related malignancy.  Patient is otherwise without complaints or active issues today.  Endoscopic Hx: - 05/18/2019: EGD: Normal esophagus, stomach, duodenum (normal duodenal biopsies) - 05/18/2019: Colonoscopy: 5 mm sigmoid adenoma, moderate sigmoid diverticulosis, internal hemorrhoids.  Normal TI. Recommended repeat in 5 years.   Past Medical History:  Diagnosis Date   Allergy    Anxiety    Cataract    Depression    Diverticulitis 06/28/2014   Fracture of fifth metacarpal bone of left hand 09/03/2011   post fall   GERD (gastroesophageal reflux disease)    Hyperlipidemia    Osteoarthritis    Seasonal allergies    Thyroid  disease     Past Surgical History:  Procedure Laterality Date   APPENDECTOMY     CESAREAN SECTION     COLONOSCOPY     negative; Dr Jakie   FRACTURE SURGERY     ORIF FINGER FRACTURE  09/03/2011   Procedure: OPEN REDUCTION INTERNAL FIXATION (ORIF) METACARPAL (FINGER) FRACTURE;  Surgeon: Fonda SHAUNNA Olmsted, MD;  Location: [facility];  Service: Orthopedics;  Laterality: Left;   TUBAL LIGATION      Prior to Admission medications   Medication Sig Start Date End Date Taking? Authorizing Provider  famotidine (PEPCID) 20 MG tablet Take 20 mg by mouth  daily. Prior to dinner. Gets OTC   Yes [provider]  levothyroxine  (SYNTHROID ) 25 MCG tablet TAKE 1 TABLET(25 MCG) BY MOUTH DAILY BEFORE BREAKFAST 11/17/23  Yes Allwardt, Alyssa M, PA-C  omeprazole  (PRILOSEC) 40 MG capsule TAKE 1 CAPSULE BY MOUTH DAILY BEFORE BREAKFAST 01/30/24  Yes Allwardt, Alyssa M, PA-C  rosuvastatin  (CRESTOR ) 10 MG tablet TAKE 1 TABLET BY MOUTH DAILY FOR CHOLESTEROL 11/26/23  Yes Allwardt, Alyssa M, PA-C  sertraline  (ZOLOFT ) 100 MG tablet Take 1 & 1/2 tablet (150 mg)  Daily  for Mood                                             /                                   TAKE                       BY                    MOUTH 01/09/24  Yes Allwardt, Alyssa M, PA-C  albuterol  (VENTOLIN  HFA) 108 (90 Base) MCG/ACT inhaler Inhale 2 puffs into the lungs every 6 (six) hours as needed for wheezing or shortness of breath. 07/01/22   Wilkinson, Dana E, NP  azithromycin  (ZITHROMAX  Z-PAK) 250 MG tablet Take two tablets on day one, followed by one tablet daily for the next four days. 01/23/24   Allwardt, Alyssa M, PA-C  benzonatate  (TESSALON ) 100 MG capsule Take 1 capsule (100 mg total) by mouth 3 (three) times daily as needed for cough. 01/23/24   Allwardt, Alyssa M, PA-C  busPIRone  (BUSPAR ) 10 MG tablet Take 1 tablet (10 mg total) by mouth 2 (two) times daily. 05/12/23   Wilkinson, Dana E, NP  Cholecalciferol (VITAMIN D -3) 125 MCG (5000 UT) TABS Take by mouth. Patient not taking: Reported on 01/23/2024    [provider]  cyclobenzaprine  (FLEXERIL ) 10 MG tablet Take  1/2 to 1 tablet  3 x /day  as needed for Muscle Spasm 11/17/23   Allwardt, Alyssa M, PA-C  sucralfate  (CARAFATE ) 1 g tablet     [provider]    Current Outpatient Medications  Medication Sig Dispense Refill   famotidine (PEPCID) 20 MG tablet Take 20 mg by mouth daily. Prior to dinner. Gets OTC     levothyroxine  (SYNTHROID ) 25 MCG tablet TAKE 1 TABLET(25 MCG) BY MOUTH DAILY BEFORE BREAKFAST 90 tablet 1    omeprazole  (PRILOSEC) 40 MG capsule TAKE 1 CAPSULE BY MOUTH DAILY BEFORE BREAKFAST 90 capsule 0   rosuvastatin  (CRESTOR ) 10 MG tablet TAKE 1 TABLET BY MOUTH DAILY FOR CHOLESTEROL 90 tablet 3   sertraline  (ZOLOFT ) 100 MG tablet Take 1 & 1/2 tablet (150 mg)  Daily  for Mood                                             /                                   TAKE                       BY                    MOUTH 135 tablet 0   albuterol  (VENTOLIN  HFA) 108 (90 Base) MCG/ACT inhaler Inhale 2 puffs into the lungs every 6 (six) hours as needed for wheezing or shortness of breath. 8 g 2   azithromycin  (ZITHROMAX  Z-PAK) 250 MG tablet Take two tablets on day one, followed by one tablet daily for the next four days. 6 tablet 0   benzonatate  (TESSALON ) 100 MG capsule Take 1 capsule (100 mg total) by mouth 3 (three) times daily as needed for cough. 30 capsule 0   busPIRone  (BUSPAR ) 10 MG tablet Take 1 tablet (10 mg total) by mouth 2 (two) times daily. 60 tablet 2   Cholecalciferol (VITAMIN D -3) 125 MCG (5000 UT) TABS Take by mouth. (Patient not taking: Reported on 01/23/2024)     cyclobenzaprine  (FLEXERIL ) 10 MG tablet Take  1/2 to 1 tablet  3 x /day  as needed for Muscle Spasm 90 tablet 1   sucralfate  (CARAFATE ) 1 g tablet      Current Facility-Administered Medications  Medication Dose Route Frequency Provider Last Rate Last Admin   0.9 %  sodium chloride  infusion  500 mL Intravenous Once Zlata Alcaide V, DO        Allergies as of 03/23/2024 - Review Complete 03/23/2024  Allergen Reaction Noted   Amoxil  [amoxicillin ] Itching  05/31/2017   Penicillins Rash 11/17/2023    Family History  Problem Relation Age of Onset   Colitis Mother    COPD Mother    Heart attack Mother 88   Anxiety disorder Mother    Arthritis Mother    Depression Mother    Varicose Veins Mother    Hypertension Father    Arthritis Father    Thyroid  disease Sister    Heart disease Maternal Grandfather    Breast cancer Other         MGaunt   Diabetes Neg Hx    Stroke Neg Hx    Colon cancer Neg Hx    Colon polyps Neg Hx    Kidney disease Neg Hx    Gallbladder disease Neg Hx    Esophageal cancer Neg Hx    Rectal cancer Neg Hx    Stomach cancer Neg Hx     Social History   Socioeconomic History   Marital status: Married    Spouse name: Not on file   Number of children: 2   Years of education: Not on file   Highest education level: 12th grade  Occupational History   Occupation: Stay at home mom  Tobacco Use   Smoking status: Never   Smokeless tobacco: Never  Vaping Use   Vaping status: Never Used  Substance and Sexual Activity   Alcohol use: No   Drug use: No   Sexual activity: Not Currently  Other Topics Concern   Not on file  Social History Narrative   Not on file   Social Drivers of Health   Financial Resource Strain: Low Risk  (01/05/2024)   Overall Financial Resource Strain (CARDIA)    Difficulty of Paying Living Expenses: Not hard at all  Food Insecurity: No Food Insecurity (01/05/2024)   Hunger Vital Sign    Worried About Running Out of Food in the Last Year: Never true    Ran Out of Food in the Last Year: Never true  Transportation Needs: No Transportation Needs (01/05/2024)   PRAPARE - Administrator, Civil Service (Medical): No    Lack of Transportation (Non-Medical): No  Physical Activity: Inactive (01/05/2024)   Exercise Vital Sign    Days of Exercise per Week: 0 days    Minutes of Exercise per Session: 0 min  Stress: No Stress Concern Present (01/05/2024)   [employer] of Occupational Health - Occupational Stress Questionnaire    Feeling of Stress: Only a little  Recent Concern: Stress - Stress Concern Present (11/16/2023)   [employer] of Occupational Health - Occupational Stress Questionnaire    Feeling of Stress : To some extent  Social Connections: Moderately Integrated (01/05/2024)   Social Connection and Isolation Panel    Frequency of Communication with  Friends and Family: More than three times a week    Frequency of Social Gatherings with Friends and Family: More than three times a week    Attends Religious Services: More than 4 times per year    Active Member of [employer] or Organizations: No    Attends Banker Meetings: Never    Marital Status: Married  Catering manager Violence: Not At Risk (01/05/2024)   Humiliation, Afraid, Rape, and Kick questionnaire    Fear of Current or Ex-Partner: No    Emotionally Abused: No    Physically Abused: No    Sexually Abused: No    Physical Exam: Vital signs in last 24 hours: @BP  134/83   Pulse 77  Temp (!) 97.4 F (36.3 C)   Ht 5' 7 (1.702 m)   Wt 138 lb (62.6 kg)   SpO2 99%   BMI 21.61 kg/m  GEN: NAD EYE: Sclerae anicteric ENT: MMM CV: Non-tachycardic Pulm: CTA b/l GI: Soft, NT/ND NEURO:  Alert & Oriented x 3   Sandor Flatter, DO [facility]   03/23/2024 8:26 AM

## 2024-03-23 NOTE — Patient Instructions (Signed)
 YOU HAD AN ENDOSCOPIC PROCEDURE TODAY AT THE [facility]:   Refer to the procedure report that was given to you for any specific questions about what was found during the examination.  If the procedure report does not answer your questions, please call your gastroenterologist to clarify.  If you requested that your care partner not be given the details of your procedure findings, then the procedure report has been included in a sealed envelope for you to review at your convenience later.  YOU SHOULD EXPECT: Some feelings of bloating in the abdomen. Passage of more gas than usual.  Walking can help get rid of the air that was put into your GI tract during the procedure and reduce the bloating. If you had a lower endoscopy (such as a colonoscopy or flexible sigmoidoscopy) you may notice spotting of blood in your stool or on the toilet paper. If you underwent a bowel prep for your procedure, you may not have a normal bowel movement for a few days.  Please Note:  You might notice some irritation and congestion in your nose or some drainage.  This is from the oxygen used during your procedure.  There is no need for concern and it should clear up in a day or so.  SYMPTOMS TO REPORT IMMEDIATELY:  Following lower endoscopy (colonoscopy or flexible sigmoidoscopy):  Excessive amounts of blood in the stool  Significant tenderness or worsening of abdominal pains  Swelling of the abdomen that is new, acute  Fever of 100F or higher   For urgent or emergent issues, a gastroenterologist can be reached at any hour by calling [phone]. Do not use MyChart messaging for urgent concerns.    DIET:  We do recommend a small meal at first, but then you may proceed to your regular diet.  Drink plenty of fluids but you should avoid alcoholic beverages for 24 hours.  MEDICATIONS: Continue present medications.  FOLLOW UP: Repeat colonoscopy in 10 years for surveillance. Return to GI office as  needed.  Educational material given to patient: Diverticulosis.  Thank you for allowing us  to provide for your healthcare needs today.  ACTIVITY:  You should plan to take it easy for the rest of today and you should NOT DRIVE or use heavy machinery until tomorrow (because of the sedation medicines used during the test).    FOLLOW UP: Our staff will call the number listed on your records the next business day following your procedure.  We will call around 7:15- 8:00 am to check on you and address any questions or concerns that you may have regarding the information given to you following your procedure. If we do not reach you, we will leave a message.     If any biopsies were taken you will be contacted by phone or by letter within the next 1-3 weeks.  Please call us  at [phone] if you have not heard about the biopsies in 3 weeks.    SIGNATURES/CONFIDENTIALITY: You and/or your care partner have signed paperwork which will be entered into your electronic medical record.  These signatures attest to the fact that that the information above on your After Visit Summary has been reviewed and is understood.  Full responsibility of the confidentiality of this discharge information lies with you and/or your care-partner.

## 2024-03-23 NOTE — Progress Notes (Signed)
 9153 Ephedrine 10 mg given IV due to low BP, MD updated.

## 2024-03-23 NOTE — Progress Notes (Signed)
 Report given to PACU, vss

## 2024-03-23 NOTE — Progress Notes (Signed)
 Pt's states no medical or surgical changes since previsit or office visit.

## 2024-03-23 NOTE — Op Note (Signed)
 [facility] Patient Name: Kathryn [name] Procedure Date: 03/23/2024 8:28 AM MRN: [id number] Endoscopist: Sandor Flatter , MD, [id number] Age: 68 Referring MD:  Date of Birth: 03/26/1956 Gender: Female Account #: [id number] Procedure:                Colonoscopy Indications:              Surveillance: Personal history of adenomatous                            polyps on last colonoscopy 5 years ago                           Last colonoscopy was 05/18/2019 and notable for a 5                            mm sigmoid adenoma, moderate sigmoid                            diverticulosis, internal hemorrhoids. Normal TI.                            Recommended repeat in 5 years.                           Otherwise, no recent GI symptoms and no known                            family history of colon cancer. Medicines:                Monitored Anesthesia Care Procedure:                Pre-Anesthesia Assessment:                           - Prior to the procedure, a History and Physical                            was performed, and patient medications and                            allergies were reviewed. The patient's tolerance of                            previous anesthesia was also reviewed. The risks                            and benefits of the procedure and the sedation                            options and risks were discussed with the patient.                            All questions were answered, and informed consent  was obtained. Prior Anticoagulants: The patient has                            taken no anticoagulant or antiplatelet agents. ASA                            Grade Assessment: II - A patient with mild systemic                            disease. After reviewing the risks and benefits,                            the patient was deemed in satisfactory condition to                            undergo the procedure.                            After obtaining informed consent, the colonoscope                            was passed under direct vision. Throughout the                            procedure, the patient's blood pressure, pulse, and                            oxygen saturations were monitored continuously. The                            CF [id number] #[id number] was introduced through the anus                            and advanced to the the cecum, identified by                            appendiceal orifice and ileocecal valve. The                            colonoscopy was performed without difficulty. The                            patient tolerated the procedure well. The quality                            of the bowel preparation was good. The ileocecal                            valve, appendiceal orifice, and rectum were                            photographed. Scope In: 8:31:30 AM Scope Out: 8:48:25 AM Scope Withdrawal Time: 0 hours 12 minutes 17 seconds  Total Procedure Duration: 0 hours 16  minutes 55 seconds  Findings:                 The perianal and digital rectal examinations were                            normal.                           Multiple small-mouthed diverticula were found in                            the sigmoid colon.                           The exam was otherwise normal throughout the                            remainder of the colon.                           The retroflexed view of the distal rectum and anal                            verge was normal and showed no anal or rectal                            abnormalities. Complications:            No immediate complications. Estimated Blood Loss:     Estimated blood loss: none. Impression:               - Diverticulosis in the sigmoid colon.                           - The distal rectum and anal verge are normal on                            retroflexion view.                           - No specimens collected. Recommendation:            - Patient has a contact number available for                            emergencies. The signs and symptoms of potential                            delayed complications were discussed with the                            patient. Return to normal activities tomorrow.                            Written discharge instructions were provided to the  patient.                           - Resume previous diet.                           - Continue present medications.                           - Repeat colonoscopy in 10 years for surveillance.                           - Return to GI office PRN. Sandor Flatter, MD 03/23/2024 8:52:57 AM

## 2024-03-26 ENCOUNTER — Telehealth: Payer: Self-pay | Admitting: *Deleted

## 2024-03-26 NOTE — Telephone Encounter (Signed)
  Follow up Call-     03/23/2024    7:39 AM  Call back number  Post procedure Call Back phone  # [phone]  Permission to leave phone message Yes     Patient questions:  Do you have a fever, pain , or abdominal swelling? No. Pain Score  0 *  Have you tolerated food without any problems? Yes.    Have you been able to return to your normal activities? Yes.    Do you have any questions about your discharge instructions: Diet   No. Medications  No. Follow up visit  No.  Do you have questions or concerns about your Care? No.  Actions: * If pain score is 4 or above: No action needed, pain <4.

## 2024-04-02 ENCOUNTER — Other Ambulatory Visit: Payer: Self-pay | Admitting: Family

## 2024-04-02 ENCOUNTER — Other Ambulatory Visit: Payer: Self-pay | Admitting: Physician Assistant

## 2024-04-02 DIAGNOSIS — F419 Anxiety disorder, unspecified: Secondary | ICD-10-CM

## 2024-04-02 MED ORDER — SERTRALINE HCL 100 MG PO TABS: ORAL_TABLET | ORAL | 0 refills | Status: AC

## 2024-04-02 NOTE — Telephone Encounter (Signed)
 Copied from CRM [id number]. Topic: Clinical - Medication Refill >> Apr 02, 2024 10:41 AM Franky GRADE wrote: Medication: sertraline  (ZOLOFT ) 100 MG tablet [[id number]]  Has the patient contacted their pharmacy? Yes, they advised patient to contact her provider's office. (Agent: If no, request that the patient contact the pharmacy for the refill. If patient does not wish to contact the pharmacy document the reason why and proceed with request.) (Agent: If yes, when and what did the pharmacy advise?)  This is the patient's preferred pharmacy:  [facility] STORE #[id number] [address], [place] - [address] AT [facility] [address] [place] [address] Phone: [phone] Fax: [phone]   Is this the correct pharmacy for this prescription? Yes If no, delete pharmacy and type the correct one.   Has the prescription been filled recently? No  Is the patient out of the medication? Yes  Has the patient been seen for an appointment in the last year OR does the patient have an upcoming appointment? Yes  Can we respond through MyChart? Yes  Agent: Please be advised that Rx refills may take up to 3 business days. We ask that you follow-up with your pharmacy.

## 2024-05-01 ENCOUNTER — Other Ambulatory Visit: Payer: Self-pay | Admitting: Physician Assistant

## 2024-05-01 DIAGNOSIS — K2100 Gastro-esophageal reflux disease with esophagitis, without bleeding: Secondary | ICD-10-CM

## 2024-05-09 ENCOUNTER — Other Ambulatory Visit: Payer: Self-pay

## 2024-05-09 ENCOUNTER — Other Ambulatory Visit: Payer: Self-pay | Admitting: Physician Assistant

## 2024-05-09 DIAGNOSIS — E039 Hypothyroidism, unspecified: Secondary | ICD-10-CM

## 2024-05-21 ENCOUNTER — Encounter: Payer: Self-pay | Admitting: Physician Assistant

## 2024-05-21 ENCOUNTER — Ambulatory Visit: Admitting: Physician Assistant

## 2024-05-21 VITALS — BP 122/80 | HR 76 | Temp 98.4°F | Resp 21 | Ht 67.0 in | Wt 142.6 lb

## 2024-05-21 DIAGNOSIS — R7989 Other specified abnormal findings of blood chemistry: Secondary | ICD-10-CM | POA: Diagnosis not present

## 2024-05-21 DIAGNOSIS — E039 Hypothyroidism, unspecified: Secondary | ICD-10-CM | POA: Diagnosis not present

## 2024-05-21 DIAGNOSIS — F419 Anxiety disorder, unspecified: Secondary | ICD-10-CM

## 2024-05-21 DIAGNOSIS — J40 Bronchitis, not specified as acute or chronic: Secondary | ICD-10-CM

## 2024-05-21 DIAGNOSIS — E782 Mixed hyperlipidemia: Secondary | ICD-10-CM | POA: Diagnosis not present

## 2024-05-21 DIAGNOSIS — F32A Depression, unspecified: Secondary | ICD-10-CM

## 2024-05-21 LAB — LIPID PANEL
Cholesterol: 149 mg/dL (ref 0–200)
HDL: 53.1 mg/dL (ref >39.00)
LDL Cholesterol: 73 mg/dL (ref 0–99)
NonHDL: 95.91
Total CHOL/HDL Ratio: 3
Triglycerides: 116 mg/dL (ref 0.0–149.0)
VLDL: 23.2 mg/dL (ref 0.0–40.0)

## 2024-05-21 LAB — TSH: TSH: 2.91 u[IU]/mL (ref 0.35–5.50)

## 2024-05-21 LAB — VITAMIN D 25 HYDROXY (VIT D DEFICIENCY, FRACTURES): VITD: 61.88 ng/mL (ref 30.00–100.00)

## 2024-05-21 MED ORDER — ALBUTEROL SULFATE HFA 108 (90 BASE) MCG/ACT IN AERS: 2.0000 | INHALATION_SPRAY | Freq: Four times a day (QID) | RESPIRATORY_TRACT | 2 refills | Status: AC | PRN

## 2024-05-21 NOTE — Progress Notes (Signed)
 Patient ID: Kathryn [name], female    DOB: 04-12-1956, 68 y.o.   MRN: [id number]   Assessment & Plan:  High serum vitamin D  -     VITAMIN D  25 Hydroxy (Vit-D Deficiency, Fractures)  Bronchitis -     Albuterol  Sulfate HFA; Inhale 2 puffs into the lungs every 6 (six) hours as needed for wheezing or shortness of breath.  Dispense: 8 g; Refill: 2  Hypothyroidism, unspecified type -     TSH  Mixed hyperlipidemia -     Lipid panel     Assessment & Plan General medical examination Routine examination with no acute concerns. Vital signs are stable. - Continue annual examinations - Encouraged regular exercise and healthy lifestyle  Bronchitis with cough, monitoring Intermittent cough with no recent use of albuterol  inhaler. - Prescribed albuterol  inhaler refill  Hypothyroidism, monitoring TSH levels fluctuate. - Ordered TSH level - Currently on levothyroxine  25 mcg daily  Mixed hyperlipidemia, monitoring Cholesterol levels monitored. Last checked in May. - Ordered cholesterol panel - Currently on Crestor  10 mg daily   Abnormal vitamin D  level, monitoring Previous high vitamin D  level. No current supplementation. - Ordered vitamin D  level  Depression, stable on therapy Depression well-managed on Zoloft . Occasional use of buspirone  for irritability. - Continue Zoloft  - Use buspirone  as needed for irritability      Return in about 1 year (around 05/21/2025) for recheck/follow-up.    Subjective:    Chief Complaint  Patient presents with   Medical Management of Chronic Issues    Pt in office for 6 mon f/u; pt thought this was annual visit for fasting labs; pt is on antibiotics having oral procedure tomorrow for wisdom tooth to be cut out; Pt due for Flu and Prevnar 20 and will discuss with PCP during visit    HPI Discussed the use of AI scribe software for clinical note transcription with the patient, who gave verbal consent to proceed.  History of Present  Illness Kathryn [name] is a 68 year old female who presents for a regular visit.  She uses a rescue albuterol  inhaler and is on Crestor  10 mg, levothyroxine , omeprazole , and Zoloft . She occasionally takes buspirone  for irritability and stress, especially when caring for a three-year-old child. She needs a refill for her inhaler, which she has not used often but wants to keep on hand due to a intermittent cough.  She is interested in checking her vitamin D  levels, as they were previously high. She has been on an antibiotic in preparation for oral surgery scheduled for tomorrow. She does not currently take a vitamin D  supplement.  She mentions a bone density scan ordered in May 2024, which she attended but has not received results for. She recalls the procedure but is unsure of the location. She has not been informed of any issues like osteopenia or low bone mass.  No bleeding issues, changes in bowel movements, or skin concerns. She had a recent colonoscopy with normal results and has a dermatology appointment for a chest lesion that resolved after two months.  She does not smoke, drink, or vape and reports no issues with hearing or vision that affect her daily life. She maintains an active lifestyle, caring for children and managing a tri-level house.  She is considering whether to receive the flu and pneumonia vaccines, as she typically gets a flu shot annually but is currently undecided.     Past Medical History:  Diagnosis Date   Allergy    Anxiety  Cataract    Depression    Diverticulitis 06/28/2014   Fracture of fifth metacarpal bone of left hand 09/03/2011   post fall   GERD (gastroesophageal reflux disease)    Hyperlipidemia    Osteoarthritis    Seasonal allergies    Thyroid  disease     Past Surgical History:  Procedure Laterality Date   APPENDECTOMY     CESAREAN SECTION     COLONOSCOPY     negative; Dr Jakie   FRACTURE SURGERY     ORIF FINGER FRACTURE  09/03/2011    Procedure: OPEN REDUCTION INTERNAL FIXATION (ORIF) METACARPAL (FINGER) FRACTURE;  Surgeon: Fonda SHAUNNA Olmsted, MD;  Location: [facility];  Service: Orthopedics;  Laterality: Left;   TUBAL LIGATION      Family History  Problem Relation Age of Onset   Colitis Mother    COPD Mother    Heart attack Mother 62   Anxiety disorder Mother    Arthritis Mother    Depression Mother    Varicose Veins Mother    Hypertension Father    Arthritis Father    Thyroid  disease Sister    Heart disease Maternal Grandfather    Breast cancer Other        MGaunt   Diabetes Neg Hx    Stroke Neg Hx    Colon cancer Neg Hx    Colon polyps Neg Hx    Kidney disease Neg Hx    Gallbladder disease Neg Hx    Esophageal cancer Neg Hx    Rectal cancer Neg Hx    Stomach cancer Neg Hx     Social History   Tobacco Use   Smoking status: Never   Smokeless tobacco: Never  Vaping Use   Vaping status: Never Used  Substance Use Topics   Alcohol use: No   Drug use: No     Allergies  Allergen Reactions   Amoxil  [Amoxicillin ] Itching   Penicillins Rash    ? Rash as child    Review of Systems NEGATIVE UNLESS OTHERWISE INDICATED IN HPI      Objective:     BP 122/80 (BP Location: Right Arm, Patient Position: Sitting, Cuff Size: Normal)   Pulse 76   Temp 98.4 F (36.9 C) (Temporal)   Resp (!) 21   Ht 5' 7 (1.702 m)   Wt 142 lb 9.6 oz (64.7 kg)   SpO2 98%   BMI 22.33 kg/m   Wt Readings from Last 3 Encounters:  05/21/24 142 lb 9.6 oz (64.7 kg)  03/23/24 138 lb (62.6 kg)  03/09/24 140 lb (63.5 kg)    BP Readings from Last 3 Encounters:  05/21/24 122/80  03/23/24 130/76  01/23/24 122/82     Physical Exam Vitals and nursing note reviewed.  Constitutional:      Appearance: Normal appearance. She is normal weight. She is not toxic-appearing.  HENT:     Head: Normocephalic and atraumatic.     Right Ear: Tympanic membrane, ear canal and external ear normal.     Left Ear:  Tympanic membrane, ear canal and external ear normal.     Nose: Nose normal.     Mouth/Throat:     Mouth: Mucous membranes are moist.  Eyes:     Extraocular Movements: Extraocular movements intact.     Conjunctiva/sclera: Conjunctivae normal.     Pupils: Pupils are equal, round, and reactive to light.  Cardiovascular:     Rate and Rhythm: Normal rate and regular rhythm.  Pulses: Normal pulses.     Heart sounds: Normal heart sounds.  Pulmonary:     Effort: Pulmonary effort is normal.     Breath sounds: Normal breath sounds.  Abdominal:     General: Abdomen is flat. Bowel sounds are normal.     Palpations: Abdomen is soft.  Musculoskeletal:        General: Normal range of motion.     Cervical back: Normal range of motion and neck supple.  Skin:    General: Skin is warm and dry.  Neurological:     General: No focal deficit present.     Mental Status: She is alert and oriented to person, place, and time.  Psychiatric:        Mood and Affect: Mood normal.        Behavior: Behavior normal.        Thought Content: Thought content normal.        Judgment: Judgment normal.             Rossy Virag M Juline Sanderford, PA-C

## 2024-05-24 ENCOUNTER — Ambulatory Visit: Payer: Self-pay | Admitting: Physician Assistant

## 2024-07-04 ENCOUNTER — Ambulatory Visit: Payer: Self-pay

## 2024-07-04 NOTE — Telephone Encounter (Signed)
 FYI Only or Action Required?: FYI only for provider: appointment scheduled on 07/05/24.  Patient was last seen in primary care on 05/21/2024 by Allwardt, Mardy HERO, PA-C.  Called Nurse Triage reporting Sore Throat, Cough, and Fever.  Symptoms began several days ago.  Interventions attempted: OTC medications: ibuporfen.  Symptoms are: gradually worsening.  Triage Disposition: See HCP Within 4 Hours (Or PCP Triage)  Patient/caregiver understands and will follow disposition?: Yes  Copied from CRM #[id number]. Topic: Clinical - Red Word Triage >> Jul 04, 2024  8:48 AM Logan F wrote: Red Word that prompted transfer to Nurse Triage: Started wioth a cough that made her throat raw and sore. Sinus area was achy and then developed a fever of 101.0. She says her family has been sick and now she caught what they had. Has been going on for a few days but fever just developed. Reason for Disposition  [1] Fever > 100 F (37.8 C) AND [2] bedridden (e.g., CVA, chronic illness, recovering from surgery)    Not bedridden, 69 years old  Additional Information  Commented on: [1] Fever > 101 F (38.3 C) AND [2] age > 60 years    100.667F  Answer Assessment - Initial Assessment Questions 1. ONSET: When did the cough begin?      4-5 days ago  2. SEVERITY: How bad is the cough today?  Pt reports she is coughing so much that her throat is raw      3. SPUTUM: Describe the color of your sputum (e.g., none, dry cough; clear, white, yellow, green)     Denies  4. HEMOPTYSIS: Are you coughing up any blood? If Yes, ask: How much? (e.g., flecks, streaks, tablespoons, etc.)     Denies  5. DIFFICULTY BREATHING: Are you having difficulty breathing? If Yes, ask: How bad is it? (e.g., mild, moderate, severe)      Denies  6. FEVER: Do you have a fever? If Yes, ask: What is your temperature, how was it measured, and when did it start?     100.667F this AM, took ibuprofen  10. OTHER SYMPTOMS: Do you have any  other symptoms? (e.g., runny nose, wheezing, chest pain)       Achy sinuses, forehead, temples.  Protocols used: Cough - Acute Productive-A-AH

## 2024-07-04 NOTE — Telephone Encounter (Signed)
 Noted pt scheduled for 07/05/24-Hudnell

## 2024-07-05 ENCOUNTER — Ambulatory Visit: Admitting: Family

## 2024-07-05 ENCOUNTER — Encounter: Payer: Self-pay | Admitting: Family

## 2024-07-05 VITALS — BP 102/62 | HR 100 | Temp 97.0°F | Ht 67.0 in | Wt 143.6 lb

## 2024-07-05 DIAGNOSIS — J3089 Other allergic rhinitis: Secondary | ICD-10-CM | POA: Diagnosis not present

## 2024-07-05 DIAGNOSIS — J069 Acute upper respiratory infection, unspecified: Secondary | ICD-10-CM | POA: Diagnosis not present

## 2024-07-05 LAB — POCT INFLUENZA A/B
Influenza A, POC: NEGATIVE
Influenza B, POC: NEGATIVE

## 2024-07-05 LAB — POC COVID19 BINAXNOW: SARS Coronavirus 2 Ag: NEGATIVE

## 2024-07-05 MED ORDER — AZITHROMYCIN 250 MG PO TABS: ORAL_TABLET | ORAL | 0 refills | Status: AC

## 2024-07-05 MED ORDER — PROMETHAZINE-DM 6.25-15 MG/5ML PO SYRP: 5.0000 mL | ORAL_SOLUTION | Freq: Every evening | ORAL | 0 refills | Status: AC | PRN

## 2024-07-05 MED ORDER — PREDNISONE 20 MG PO TABS: ORAL_TABLET | ORAL | 0 refills | Status: DC

## 2024-07-05 NOTE — Addendum Note (Signed)
 Addended by: Meliah Appleman on: 07/05/2024 12:02 PM   Modules accepted: Orders

## 2024-07-05 NOTE — Progress Notes (Addendum)
 "  Patient ID: Kathryn [name], female    DOB: 10/09/1955, 69 y.o.   MRN: [id number]  Chief Complaint  Patient presents with   Sinus Problem    Pt c/o cough, sore throat,fever of 100.1 and sinus pressure, Has tried ibuprofen and tylenol , which did help slightly. Sx present for 5 days.   Discussed the use of AI scribe software for clinical note transcription with the patient, who gave verbal consent to proceed.  History of Present Illness Kathryn [name] is a 69 year old female who presents with persistent cough, sinus pressure, and congestion.  She has a persistent productive cough with colored mucus, worst in the morning, with hoarseness and body aches. Last night she woke feeling like she was choking from the cough. She has significant sinus pressure, headaches, and nasal congestion. She has used Flonase  daily, Claritin, and tried Mucinex  last night. Promethazine  DM previously helped her sleep but she has run out. She had a fever two days ago that continued yesterday and is 99.6F this morning. She takes ibuprofen for aches. She has no asthma and no ear pain. She drinks at least two liters of water daily. Her whole family has been sick since Christmas and she became ill most recently.  Assessment & Plan Acute upper respiratory infection Persistent cough, hoarseness, sinus pressure, headache, and colored mucus. Fever reduced to 99.6F. Lungs clear on exam. - Prescribed azithromycin  (Z-Pak) x 5d. - Prescribed prednisone  40 mg in the morning for three days, then reduce to 20 mg for two days. - Refilling Promethazine -DM to use at bedtime to help with sleep. - Recommended increasing Flonase  to two sprays in each nostril twice a day for a few days. - Advised increasing fluid intake to at least two liters a day. - Suggested using cough lozenges to soothe throat and reduce coughing. - Instructed to report if symptoms do not improve.  Allergic rhinitis Chronic allergic rhinitis managed with Flonase  and  Claritin. Symptoms include sinus congestion and pressure. - Continue Flonase , increasing to two sprays in each nostril twice a day for a few days. - Continue Claritin as needed.  General health maintenance Discussion of pneumonia vaccination due to age and risk of lung infections. - Recommended receiving the pneumonia vaccine (Prevnar 20) at the next follow-up visit.   Subjective:    Outpatient Medications Prior to Visit  Medication Sig Dispense Refill   albuterol  (VENTOLIN  HFA) 108 (90 Base) MCG/ACT inhaler Inhale 2 puffs into the lungs every 6 (six) hours as needed for wheezing or shortness of breath. 8 g 2   busPIRone  (BUSPAR ) 10 MG tablet Take 1 tablet (10 mg total) by mouth 2 (two) times daily. (Patient taking differently: Take 10 mg by mouth 2 (two) times daily. Pt admits PRN only) 60 tablet 2   cyclobenzaprine  (FLEXERIL ) 10 MG tablet Take  1/2 to 1 tablet  3 x /day  as needed for Muscle Spasm (Patient taking differently: Take  1/2 to 1 tablet  3 x /day  as needed for Muscle Spasm PRN only per pt) 90 tablet 1   famotidine (PEPCID) 20 MG tablet Take 20 mg by mouth daily. Prior to dinner. Gets OTC     levothyroxine  (SYNTHROID ) 25 MCG tablet TAKE 1 TABLET(25 MCG) BY MOUTH DAILY BEFORE BREAKFAST 90 tablet 1   omeprazole  (PRILOSEC) 40 MG capsule TAKE 1 CAPSULE BY MOUTH DAILY BEFORE BREAKFAST 90 capsule 0   rosuvastatin  (CRESTOR ) 10 MG tablet TAKE 1 TABLET BY MOUTH DAILY FOR CHOLESTEROL 90 tablet  3   sertraline  (ZOLOFT ) 100 MG tablet Take 1 & 1/2 tablet (150 mg)  Daily  for Mood                                             /                                   TAKE                       BY                    MOUTH 135 tablet 0   sucralfate  (CARAFATE ) 1 g tablet  (Patient taking differently: PRN only)     Facility-Administered Medications Prior to Visit  Medication Dose Route Frequency Provider Last Rate Last Admin   0.9 %  sodium chloride  infusion  500 mL Intravenous Once Cirigliano, Vito V, DO        Past Medical History:  Diagnosis Date   Allergy    Anxiety    Cataract    Depression    Diverticulitis 06/28/2014   Fracture of fifth metacarpal bone of left hand 09/03/2011   post fall   GERD (gastroesophageal reflux disease)    Hyperlipidemia    Osteoarthritis    Seasonal allergies    Thyroid  disease    Past Surgical History:  Procedure Laterality Date   APPENDECTOMY     CESAREAN SECTION     COLONOSCOPY     negative; Dr Jakie   FRACTURE SURGERY     ORIF FINGER FRACTURE  09/03/2011   Procedure: OPEN REDUCTION INTERNAL FIXATION (ORIF) METACARPAL (FINGER) FRACTURE;  Surgeon: Fonda SHAUNNA Olmsted, MD;  Location: [facility];  Service: Orthopedics;  Laterality: Left;   TUBAL LIGATION     Allergies[1]    Objective:    Physical Exam Vitals and nursing note reviewed.  Constitutional:      Appearance: Normal appearance. She is ill-appearing.     Interventions: Face mask in place.  HENT:     Right Ear: Tympanic membrane and ear canal normal.     Left Ear: Tympanic membrane and ear canal normal.     Nose:     Right Sinus: Frontal sinus tenderness present.     Left Sinus: Frontal sinus tenderness present.     Mouth/Throat:     Mouth: Mucous membranes are moist.     Pharynx: Posterior oropharyngeal erythema present. No pharyngeal swelling, oropharyngeal exudate or uvula swelling.     Tonsils: No tonsillar exudate or tonsillar abscesses.     Comments: Hoarseness noted. Cardiovascular:     Rate and Rhythm: Normal rate and regular rhythm.  Pulmonary:     Effort: Pulmonary effort is normal.     Breath sounds: Normal breath sounds.  Musculoskeletal:        General: Normal range of motion.  Lymphadenopathy:     Head:     Right side of head: No preauricular or posterior auricular adenopathy.     Left side of head: No preauricular or posterior auricular adenopathy.     Cervical: No cervical adenopathy.  Skin:    General: Skin is warm and dry.   Neurological:     Mental Status: She is alert.  Psychiatric:  Mood and Affect: Mood normal.        Behavior: Behavior normal.    BP 102/62 (BP Location: Left Arm, Patient Position: Sitting, Cuff Size: Normal)   Pulse 100   Temp (!) 97 F (36.1 C) (Temporal)   Ht 5' 7 (1.702 m)   Wt 143 lb 9.6 oz (65.1 kg)   SpO2 94%   BMI 22.49 kg/m  Wt Readings from Last 3 Encounters:  07/05/24 143 lb 9.6 oz (65.1 kg)  05/21/24 142 lb 9.6 oz (64.7 kg)  03/23/24 138 lb (62.6 kg)       Lucius Krabbe, NP     [1]  Allergies Allergen Reactions   Amoxil  [Amoxicillin ] Itching   Penicillins Rash    ? Rash as child   "

## 2024-07-11 ENCOUNTER — Telehealth: Payer: Self-pay

## 2024-07-11 ENCOUNTER — Other Ambulatory Visit: Payer: Self-pay

## 2024-07-11 NOTE — Telephone Encounter (Signed)
 Copied from CRM [id number]. Topic: Clinical - Medical Advice >> Jul 11, 2024  9:00 AM Aleatha C wrote: Reason for CRM: Patient had a upper respiratory infection and she is done with the antibiotics that she previous had and would like some more called in please call her to confirm if can be done call back # [phone]  And sent to:  [id number] [address], [place] - [address] AT [facility] [address] [place] [address] Phone: [phone] Fax: [phone] Hours: Not open 24 hours  Please see request from patient requesting additional medication and advise; no antibiotics were prescribed at recent visit with Hudnell. Pt prescribed burst of Prednisone  and cough medication.

## 2024-07-13 ENCOUNTER — Other Ambulatory Visit: Payer: Self-pay | Admitting: Physician Assistant

## 2024-07-13 ENCOUNTER — Ambulatory Visit: Payer: Self-pay | Admitting: Physician Assistant

## 2024-07-13 MED ORDER — NYSTATIN 100000 UNIT/ML MT SUSP: 5.0000 mL | Freq: Four times a day (QID) | OROMUCOSAL | 0 refills | Status: AC

## 2024-07-13 NOTE — Telephone Encounter (Signed)
 Please see triage note for pt and possible thrush and advise if anything needed at this time

## 2024-07-13 NOTE — Telephone Encounter (Signed)
 Called pt and advised Rx sent to pharmacy. Pt verbalized understanding if not better by next week she will need to be seen in office

## 2024-07-13 NOTE — Telephone Encounter (Signed)
 FYI Only or Action Required?: Action required by provider: Medication Request.  Patient was last seen in primary care on 07/05/2024 by Lucius Krabbe, NP.  Called Nurse Triage reporting Sore Throat.  Symptoms began several days ago.  Interventions attempted: Nothing.  Symptoms are: gradually worsening.  Triage Disposition: Home Care  Patient/caregiver understands and will follow disposition?: Yes   Message from Montie POUR sent at 07/13/2024 10:46 AM EST  Reason for Triage: She finished her antibiotics and now she is having sore throat, tongue pain. Pain is not bad. Please call Ms. Covalt at [phone] to discuss what to take.   Reason for Disposition  [1] Sore throat with cough/cold symptoms AND [2] present < 5 days  Answer Assessment - Initial Assessment Questions 1. ONSET: When did the throat start hurting? (Hours or days ago)      X days  2. SEVERITY: How bad is the sore throat? (Scale 1-10; mild, moderate or severe)      Aches/irritated. No pain  3. STREP EXPOSURE: Has there been any exposure to strep within the past week? If Yes, ask: What type of contact occurred?       Denies  4.  VIRAL SYMPTOMS: Are there any symptoms of a cold, such as a runny nose, cough, hoarse voice or red eyes?       Hoarse  5. FEVER: Do you have a fever? If Yes, ask: What is your temperature, how was it measured, and when did it start?     Denies  6. PUS ON THE TONSILS: Is there pus on the tonsils in the back of your throat? Denies        7. OTHER SYMPTOMS: Do you have any other symptoms? (e.g., difficulty breathing, headache, rash)      Tongue pain, white tongue  Pt reports possible oral thrush. Pt seen in clinic and treated for URI. Pt has been taking antibiotics and using her inhaler which she believes has caused oral thrush. Pt states her tongue looks disgusting Pt is taking OTC Pt agrees with plan of care, will call back for any worsening symptoms  Protocols  used: Sore Throat-A-AH

## 2024-07-24 ENCOUNTER — Encounter: Payer: Self-pay | Admitting: Family Medicine

## 2024-07-24 ENCOUNTER — Ambulatory Visit (INDEPENDENT_AMBULATORY_CARE_PROVIDER_SITE_OTHER): Admitting: Family Medicine

## 2024-07-24 VITALS — BP 122/74 | HR 80 | Temp 97.0°F | Ht 67.0 in | Wt 146.2 lb

## 2024-07-24 DIAGNOSIS — J020 Streptococcal pharyngitis: Secondary | ICD-10-CM

## 2024-07-24 DIAGNOSIS — J029 Acute pharyngitis, unspecified: Secondary | ICD-10-CM

## 2024-07-24 LAB — POCT RAPID STREP A (OFFICE): Rapid Strep A Screen: POSITIVE — AB

## 2024-07-24 MED ORDER — CEFDINIR 300 MG PO CAPS: 300.0000 mg | ORAL_CAPSULE | Freq: Two times a day (BID) | ORAL | 0 refills | Status: AC

## 2024-07-24 MED ORDER — PREDNISONE 20 MG PO TABS: 40.0000 mg | ORAL_TABLET | Freq: Every day | ORAL | 0 refills | Status: AC

## 2024-07-24 MED ORDER — FLUCONAZOLE 150 MG PO TABS: 150.0000 mg | ORAL_TABLET | ORAL | 0 refills | Status: AC | PRN

## 2024-07-24 NOTE — Progress Notes (Signed)
 SABRA

## 2024-07-24 NOTE — Patient Instructions (Signed)
 Sent in prednisone  and omnicef   If yeast-then the diflucan   Worse ER  Not improving, we will get a scan

## 2024-07-24 NOTE — Progress Notes (Signed)
 "  Subjective:     Patient ID: Kathryn [name], female    DOB: April 25, 1956, 69 y.o.   MRN: [id number]  Chief Complaint  Patient presents with   Sore Throat    Pt states her throat is hurting but feels like she a a golf ball in there    Discussed the use of AI scribe software for clinical note transcription with the patient, who gave verbal consent to proceed.  History of Present Illness Sole Kathryn [name] is a 69 year old female who presents with a sore throat and sensation of a lump in the throat.  She has experienced a sore throat with a sensation of 'swallowing over a lump' for a couple of weeks. The discomfort is described as soreness in the glands and a feeling of a lump when swallowing, particularly on the sides of the throat.  Her symptoms began after being diagnosed with a respiratory virus on January 8th, for which she was prescribed prednisone  and a Z-Pak. She subsequently developed a yeast infection, which was treated with medication. During this time, she experienced a coated and painful tongue.  She has been using an albuterol  inhaler frequently due to a cough, which she did not realize required rinsing her mouth afterward. She reports persistent hoarseness and gland issues that have not improved.  No fevers have been noted, but she feels very tired and continues to experience some coughing. She is allergic to amoxicillin  and penicillin, which cause a rash but no respiratory symptoms.    Health Maintenance Due  Topic Date Due   COVID-19 Vaccine (3 - Pfizer risk series) 03/08/2020   Influenza Vaccine  01/27/2024    Past Medical History:  Diagnosis Date   Allergy    Anxiety    Cataract    Depression    Diverticulitis 06/28/2014   Fracture of fifth metacarpal bone of left hand 09/03/2011   post fall   GERD (gastroesophageal reflux disease)    Hyperlipidemia    Osteoarthritis    Seasonal allergies    Thyroid  disease     Past Surgical History:  Procedure Laterality Date    APPENDECTOMY     CESAREAN SECTION     COLONOSCOPY     negative; Dr Jakie   FRACTURE SURGERY     ORIF FINGER FRACTURE  09/03/2011   Procedure: OPEN REDUCTION INTERNAL FIXATION (ORIF) METACARPAL (FINGER) FRACTURE;  Surgeon: Fonda SHAUNNA Olmsted, MD;  Location: [facility];  Service: Orthopedics;  Laterality: Left;   TUBAL LIGATION      Current Medications[1]  Allergies[2] ROS neg/noncontributory except as noted HPI/below      Objective:     BP 122/74 (BP Location: Left Arm, Patient Position: Sitting, Cuff Size: Normal)   Pulse 80   Temp (!) 97 F (36.1 C) (Temporal)   Ht 5' 7 (1.702 m)   Wt 146 lb 4 oz (66.3 kg)   SpO2 98%   BMI 22.91 kg/m  Wt Readings from Last 3 Encounters:  07/24/24 146 lb 4 oz (66.3 kg)  07/05/24 143 lb 9.6 oz (65.1 kg)  05/21/24 142 lb 9.6 oz (64.7 kg)    Physical Exam GENERAL: Well developed, well nourished, no acute distress. HEAD EYES EARS NOSE THROAT: Normocephalic, atraumatic, conjunctiva not injected, sclera nonicteric, oropharynx slightly red, no exudates. TM wnl B CARDIAC: Regular rate and rhythm, S1 S2 present NECK: Neck tight, mildly enlarged lymph nodes.no thyromegaly LUNGS: Clear to auscultation bilaterally, no wheezes. ABDOMEN: Bowel sounds present, soft, non-tender, non-distended, no  hepatosplenomegaly, no masses. EXTREMITIES: No edema. MUSCULOSKELETAL: No gross abnormalities. NEUROLOGICAL: Alert and oriented x3, cranial nerves II through XII intact. PSYCHIATRIC: Normal mood, good eye contact.   Results for orders placed or performed in visit on 07/24/24  POCT rapid strep A   Collection Time: 07/24/24 11:44 AM  Result Value Ref Range   Rapid Strep A Screen Positive (A) Negative       Assessment & Plan:  Sore throat -     POCT rapid strep A  Strep pharyngitis  Other orders -     Cefdinir ; Take 1 capsule (300 mg total) by mouth 2 (two) times daily.  Dispense: 14 capsule; Refill: 0 -     predniSONE ; Take 2  tablets (40 mg total) by mouth daily with breakfast for 5 days.  Dispense: 10 tablet; Refill: 0 -     Fluconazole ; Take 1 tablet (150 mg total) by mouth every three (3) days as needed.  Dispense: 2 tablet; Refill: 0    Assessment and Plan Assessment & Plan Streptococcal pharyngitis  -rapid strep faint + She experiences a sore throat with a sensation of a lump, hoarseness, and mildly enlarged lymph nodes. A faint positive strep test suggests possible streptococcal pharyngitis.. Differential diagnosis includes viral infection and possible strep infection. Prescribe Omnicef  twice daily. Advise gargles, ibuprofen, or acetaminophen  for symptom relief. Instruct to seek emergency care if unable to swallow or if symptoms worsen significantly. Prednisone  40mg  daily If symptoms do not improve, order a scan.  Oral candidiasis  - improved but may recur Likely secondary to prednisone  use,. Previous yeast infection was treated with antifungal medication. Prescribe Diflucan  if symptoms of yeast infection recur.     Return if symptoms worsen or fail to improve.  Jenkins CHRISTELLA Carrel, MD      [1]  Current Outpatient Medications:    albuterol  (VENTOLIN  HFA) 108 (90 Base) MCG/ACT inhaler, Inhale 2 puffs into the lungs every 6 (six) hours as needed for wheezing or shortness of breath., Disp: 8 g, Rfl: 2   busPIRone  (BUSPAR ) 10 MG tablet, Take 1 tablet (10 mg total) by mouth 2 (two) times daily. (Patient taking differently: Take 10 mg by mouth 2 (two) times daily. Pt admits PRN only), Disp: 60 tablet, Rfl: 2   cefdinir  (OMNICEF ) 300 MG capsule, Take 1 capsule (300 mg total) by mouth 2 (two) times daily., Disp: 14 capsule, Rfl: 0   cyclobenzaprine  (FLEXERIL ) 10 MG tablet, Take  1/2 to 1 tablet  3 x /day  as needed for Muscle Spasm (Patient taking differently: Take  1/2 to 1 tablet  3 x /day  as needed for Muscle Spasm PRN only per pt), Disp: 90 tablet, Rfl: 1   famotidine (PEPCID) 20 MG tablet, Take 20 mg by mouth  daily. Prior to dinner. Gets OTC, Disp: , Rfl:    fluconazole  (DIFLUCAN ) 150 MG tablet, Take 1 tablet (150 mg total) by mouth every three (3) days as needed., Disp: 2 tablet, Rfl: 0   ibuprofen (ADVIL) 600 MG tablet, Take 600 mg by mouth every 6 (six) hours as needed., Disp: , Rfl:    levothyroxine  (SYNTHROID ) 25 MCG tablet, TAKE 1 TABLET(25 MCG) BY MOUTH DAILY BEFORE BREAKFAST, Disp: 90 tablet, Rfl: 1   omeprazole  (PRILOSEC) 40 MG capsule, TAKE 1 CAPSULE BY MOUTH DAILY BEFORE BREAKFAST, Disp: 90 capsule, Rfl: 0   oxyCODONE -acetaminophen  (PERCOCET/ROXICET) 5-325 MG tablet, Take 1 tablet by mouth every 4 (four) hours as needed., Disp: , Rfl:    predniSONE  (  DELTASONE ) 20 MG tablet, Take 2 tablets (40 mg total) by mouth daily with breakfast for 5 days., Disp: 10 tablet, Rfl: 0   promethazine -dextromethorphan (PROMETHAZINE -DM) 6.25-15 MG/5ML syrup, Take 5 mLs by mouth at bedtime as needed for cough., Disp: 120 mL, Rfl: 0   rosuvastatin  (CRESTOR ) 10 MG tablet, TAKE 1 TABLET BY MOUTH DAILY FOR CHOLESTEROL, Disp: 90 tablet, Rfl: 3   sertraline  (ZOLOFT ) 100 MG tablet, Take 1 & 1/2 tablet (150 mg)  Daily  for Mood                                             /                                   TAKE                       BY                    MOUTH, Disp: 135 tablet, Rfl: 0   sucralfate  (CARAFATE ) 1 g tablet, , Disp: , Rfl:   Current Facility-Administered Medications:    0.9 %  sodium chloride  infusion, 500 mL, Intravenous, Once, Cirigliano, Vito V, DO [2]  Allergies Allergen Reactions   Amoxil  [Amoxicillin ] Itching   Penicillins Rash    ? Rash as child   "

## 2024-07-25 ENCOUNTER — Ambulatory Visit: Admitting: Family Medicine

## 2024-07-28 ENCOUNTER — Other Ambulatory Visit: Payer: Self-pay | Admitting: Physician Assistant

## 2024-07-28 DIAGNOSIS — K2100 Gastro-esophageal reflux disease with esophagitis, without bleeding: Secondary | ICD-10-CM

## 2024-09-03 ENCOUNTER — Ambulatory Visit: Admitting: Physician Assistant

## 2025-01-10 ENCOUNTER — Ambulatory Visit

## 2025-05-13 ENCOUNTER — Encounter: Admitting: Physician Assistant

## 2025-05-27 ENCOUNTER — Encounter: Admitting: Physician Assistant
